# Patient Record
Sex: Female | Born: 1990
Health system: Southern US, Community
[De-identification: ages and names within clinical notes are randomized; demographics above are authoritative.]

## PROBLEM LIST (undated history)

## (undated) DIAGNOSIS — F419 Anxiety disorder, unspecified: Secondary | ICD-10-CM

## (undated) DIAGNOSIS — IMO0002 Reserved for concepts with insufficient information to code with codable children: Secondary | ICD-10-CM

## (undated) DIAGNOSIS — M3500 Sicca syndrome, unspecified: Secondary | ICD-10-CM

## (undated) DIAGNOSIS — F32A Depression, unspecified: Secondary | ICD-10-CM

## (undated) DIAGNOSIS — M329 Systemic lupus erythematosus, unspecified: Secondary | ICD-10-CM

## (undated) HISTORY — DX: Systemic lupus erythematosus, unspecified: M32.9

## (undated) HISTORY — DX: Anxiety disorder, unspecified: F41.9

## (undated) HISTORY — DX: Depression, unspecified: F32.A

## (undated) HISTORY — DX: Reserved for concepts with insufficient information to code with codable children: IMO0002

## (undated) HISTORY — PX: CYSTOSCOPY: SUR368

## (undated) HISTORY — DX: Sjogren syndrome, unspecified: M35.00

## (undated) HISTORY — PX: ABCESS DRAINAGE: SHX399

---

## 2017-06-12 DIAGNOSIS — L930 Discoid lupus erythematosus: Secondary | ICD-10-CM | POA: Insufficient documentation

## 2017-06-12 DIAGNOSIS — M3500 Sicca syndrome, unspecified: Secondary | ICD-10-CM | POA: Insufficient documentation

## 2018-09-26 ENCOUNTER — Other Ambulatory Visit: Payer: Self-pay

## 2018-09-26 DIAGNOSIS — Z20822 Contact with and (suspected) exposure to covid-19: Secondary | ICD-10-CM

## 2018-09-27 LAB — NOVEL CORONAVIRUS, NAA: SARS-CoV-2, NAA: NOT DETECTED

## 2019-08-22 DIAGNOSIS — F331 Major depressive disorder, recurrent, moderate: Secondary | ICD-10-CM | POA: Diagnosis not present

## 2019-08-22 DIAGNOSIS — G4700 Insomnia, unspecified: Secondary | ICD-10-CM | POA: Diagnosis not present

## 2019-10-23 DIAGNOSIS — F331 Major depressive disorder, recurrent, moderate: Secondary | ICD-10-CM | POA: Diagnosis not present

## 2019-10-23 DIAGNOSIS — G4700 Insomnia, unspecified: Secondary | ICD-10-CM | POA: Diagnosis not present

## 2019-10-23 DIAGNOSIS — F411 Generalized anxiety disorder: Secondary | ICD-10-CM | POA: Diagnosis not present

## 2019-11-13 DIAGNOSIS — N914 Secondary oligomenorrhea: Secondary | ICD-10-CM | POA: Diagnosis not present

## 2019-11-13 DIAGNOSIS — R300 Dysuria: Secondary | ICD-10-CM | POA: Diagnosis not present

## 2019-11-13 DIAGNOSIS — N61 Mastitis without abscess: Secondary | ICD-10-CM | POA: Diagnosis not present

## 2019-11-13 DIAGNOSIS — Z6822 Body mass index (BMI) 22.0-22.9, adult: Secondary | ICD-10-CM | POA: Diagnosis not present

## 2019-11-19 MED FILL — CITALOPRAM HBR 20 MG TABLET: 20 | 30 days supply | Qty: 30 | Fill #0

## 2019-11-19 MED FILL — traZODone HCL 50 MG TABS: 50 | 30 days supply | Qty: 30 | Fill #0

## 2019-11-19 MED FILL — HYDROXYZINE HCL 25 MG TABS: 25 | 30 days supply | Qty: 60 | Fill #0

## 2019-11-19 MED FILL — SPIRONOLACTONE 25 MG TABS: 25 | 30 days supply | Qty: 60 | Fill #0

## 2019-11-28 ENCOUNTER — Ambulatory Visit: Payer: Self-pay | Attending: Internal Medicine

## 2019-11-28 DIAGNOSIS — Z23 Encounter for immunization: Secondary | ICD-10-CM

## 2019-11-28 NOTE — Progress Notes (Signed)
   Cov[facility] Vaccination [facility]  Name:  Debbie [name]    MRN: [id number] DOB: 11-13-1990  11/28/2019  [name] was observed post Covid-19 immunization for 15 minutes without incident. She was provided with Vaccine Information Sheet and instruction to access the V-Safe system.   [name] was instructed to call 911 with any severe reactions post vaccine: Marland Kitchen Difficulty breathing  . Swelling of face and throat  . A fast heartbeat  . A bad rash all over body  . Dizziness and weakness

## 2019-12-25 ENCOUNTER — Other Ambulatory Visit (HOSPITAL_COMMUNITY): Payer: Self-pay | Admitting: Internal Medicine

## 2019-12-25 DIAGNOSIS — F411 Generalized anxiety disorder: Secondary | ICD-10-CM | POA: Diagnosis not present

## 2019-12-25 DIAGNOSIS — G4700 Insomnia, unspecified: Secondary | ICD-10-CM | POA: Diagnosis not present

## 2019-12-25 DIAGNOSIS — F331 Major depressive disorder, recurrent, moderate: Secondary | ICD-10-CM | POA: Diagnosis not present

## 2019-12-25 MED FILL — HYDROXYZINE HCL 25 MG TABS: 25 | 30 days supply | Qty: 60 | Fill #0

## 2019-12-25 MED FILL — traZODone HCL 50 MG TABS: 50 | 30 days supply | Qty: 30 | Fill #0

## 2019-12-25 MED FILL — CITALOPRAM HBR 20 MG TABLET: 20 | 30 days supply | Qty: 30 | Fill #0

## 2020-01-01 DIAGNOSIS — Z20822 Contact with and (suspected) exposure to covid-19: Secondary | ICD-10-CM | POA: Diagnosis not present

## 2020-01-22 MED FILL — traZODone HCL 50 MG TABS: 50 | 30 days supply | Qty: 30 | Fill #1

## 2020-01-22 MED FILL — HYDROXYZINE HCL 25 MG TABS: 25 | 30 days supply | Qty: 60 | Fill #1

## 2020-01-22 MED FILL — CITALOPRAM HBR 20 MG TABLET: 20 | 30 days supply | Qty: 30 | Fill #1

## 2020-01-27 ENCOUNTER — Other Ambulatory Visit (HOSPITAL_COMMUNITY): Payer: Self-pay | Admitting: Internal Medicine

## 2020-01-27 DIAGNOSIS — R5383 Other fatigue: Secondary | ICD-10-CM | POA: Diagnosis not present

## 2020-01-27 DIAGNOSIS — F331 Major depressive disorder, recurrent, moderate: Secondary | ICD-10-CM | POA: Diagnosis not present

## 2020-01-27 DIAGNOSIS — E559 Vitamin D deficiency, unspecified: Secondary | ICD-10-CM | POA: Diagnosis not present

## 2020-01-27 DIAGNOSIS — G4700 Insomnia, unspecified: Secondary | ICD-10-CM | POA: Diagnosis not present

## 2020-01-27 DIAGNOSIS — F411 Generalized anxiety disorder: Secondary | ICD-10-CM | POA: Diagnosis not present

## 2020-01-27 MED FILL — FLUoxetine HCL 20 MG CAPS: 20 | 30 days supply | Qty: 30 | Fill #0

## 2020-03-04 ENCOUNTER — Other Ambulatory Visit (HOSPITAL_COMMUNITY): Payer: Self-pay | Admitting: Internal Medicine

## 2020-03-04 DIAGNOSIS — F411 Generalized anxiety disorder: Secondary | ICD-10-CM | POA: Diagnosis not present

## 2020-03-04 DIAGNOSIS — Z9189 Other specified personal risk factors, not elsewhere classified: Secondary | ICD-10-CM | POA: Diagnosis not present

## 2020-03-04 DIAGNOSIS — F331 Major depressive disorder, recurrent, moderate: Secondary | ICD-10-CM | POA: Diagnosis not present

## 2020-03-04 DIAGNOSIS — G4700 Insomnia, unspecified: Secondary | ICD-10-CM | POA: Diagnosis not present

## 2020-03-04 MED FILL — HYDROXYZINE HCL 25 MG TABS: 25 | 30 days supply | Qty: 60 | Fill #0

## 2020-03-04 MED FILL — traZODone HCL 50 MG TABS: 50 | 30 days supply | Qty: 30 | Fill #0

## 2020-03-04 MED FILL — FLUoxetine HCL 20 MG CAPS: 20 | 30 days supply | Qty: 30 | Fill #0

## 2020-03-18 MED FILL — traZODone HCL 50 MG TABS: 50 | 30 days supply | Qty: 30 | Fill #0

## 2020-04-01 MED FILL — FLUoxetine HCL 20 MG CAPS: 20 | 30 days supply | Qty: 30 | Fill #0

## 2020-04-02 ENCOUNTER — Other Ambulatory Visit (HOSPITAL_COMMUNITY): Payer: Self-pay | Admitting: Internal Medicine

## 2020-04-02 DIAGNOSIS — F331 Major depressive disorder, recurrent, moderate: Secondary | ICD-10-CM | POA: Diagnosis not present

## 2020-04-02 DIAGNOSIS — G4700 Insomnia, unspecified: Secondary | ICD-10-CM | POA: Diagnosis not present

## 2020-04-02 DIAGNOSIS — F411 Generalized anxiety disorder: Secondary | ICD-10-CM | POA: Diagnosis not present

## 2020-04-23 MED FILL — traZODone HCL 50 MG TABS: 50 | 30 days supply | Qty: 30 | Fill #0

## 2020-05-05 ENCOUNTER — Other Ambulatory Visit: Payer: Self-pay

## 2020-05-05 ENCOUNTER — Other Ambulatory Visit (HOSPITAL_COMMUNITY)
Admission: RE | Admit: 2020-05-05 | Discharge: 2020-05-05 | Disposition: A | Discharge status: Home / Self Care | Payer: 59 | Source: Ambulatory Visit | Attending: Physician Assistant | Admitting: Physician Assistant

## 2020-05-05 ENCOUNTER — Encounter: Payer: Self-pay | Admitting: Physician Assistant

## 2020-05-05 ENCOUNTER — Ambulatory Visit (INDEPENDENT_AMBULATORY_CARE_PROVIDER_SITE_OTHER): Payer: 59 | Admitting: Physician Assistant

## 2020-05-05 VITALS — BP 111/78 | HR 67 | Temp 98.2°F | Ht 66.0 in | Wt 141.0 lb

## 2020-05-05 DIAGNOSIS — Z124 Encounter for screening for malignant neoplasm of cervix: Secondary | ICD-10-CM | POA: Diagnosis not present

## 2020-05-05 DIAGNOSIS — Z0000 Encounter for general adult medical examination without abnormal findings: Secondary | ICD-10-CM | POA: Insufficient documentation

## 2020-05-05 DIAGNOSIS — R300 Dysuria: Secondary | ICD-10-CM | POA: Diagnosis not present

## 2020-05-05 DIAGNOSIS — F320 Major depressive disorder, single episode, mild: Secondary | ICD-10-CM | POA: Diagnosis not present

## 2020-05-05 DIAGNOSIS — M3505 Sjogren syndrome with inflammatory arthritis: Secondary | ICD-10-CM

## 2020-05-05 LAB — POCT URINALYSIS DIPSTICK
Bilirubin, UA: NEGATIVE
Blood, UA: NEGATIVE
Glucose, UA: NEGATIVE
Ketones, UA: NEGATIVE
Leukocytes, UA: NEGATIVE
Nitrite, UA: NEGATIVE
Protein, UA: NEGATIVE
Spec Grav, UA: 1.015 (ref 1.010–1.025)
Urobilinogen, UA: 0.2 E.U./dL
pH, UA: 7 (ref 5.0–8.0)

## 2020-05-05 NOTE — Progress Notes (Signed)
New Patient Office Visit  Subjective:  Patient ID: Debbie [name], female    DOB: 1991-01-02  Age: 30 y.o. MRN: [id number]  CC:  Chief Complaint  Patient presents with  . Annual Exam    HPI DERRY ARBOGAST presents for new patient establishment and annual exam.  She has been married for 2 years to her husband and has no children.  She is working as a Pharmacist, community.  She enjoys her work very much.  She has a Liletta IUD placed in 2018 or 2019 (card at home with exact date).   Acute concerns: She has a hx of Sjogren's and possibly Lupus. She has joint pain mostly in hands, knees, and feet, and stiffness. Starting to have more rashes and skin concerns. Previously saw rheumatology about 3 years ago and needs new referral.   Health maintenance: Lifestyle/ exercise: Minimal  Nutrition: Home cooked meals, rarely fast food  Mental health: Sees psychiatry, hx depression, no hospitalizations, no SI / HI  Caffeine: 20 ounces coffee daily, no other drinks  Sleep: At least 5 hours per night, takes Trazodone 50 mg every night Substance use: Social drinks or cigarettes rarely  Sexual activity: Married, monogamous, no hx STIs Immunizations: UTD Pap: At least 2 years ago, always normal.  Past Medical History:  Diagnosis Date  . Anxiety   . Depression   . Lupus (HCC)     History reviewed. No pertinent surgical history.  Family History  Problem Relation Age of Onset  . Thyroid disease Mother   . Cancer Maternal Grandmother   . Cancer Paternal Grandfather     Social History   Socioeconomic History  . Marital status: Married    Spouse name: Not on file  . Number of children: Not on file  . Years of education: Not on file  . Highest education level: Not on file  Occupational History  . Not on file  Tobacco Use  . Smoking status: Light Tobacco Smoker  . Smokeless tobacco: Never Used  Vaping Use  . Vaping Use: Never used  Substance and Sexual  Activity  . Alcohol use: Yes    Comment: rare  . Drug use: Not on file  . Sexual activity: Not on file  Other Topics Concern  . Not on file  Social History Narrative  . Not on file   Social Determinants of Health   Financial Resource Strain: Not on file  Food Insecurity: Not on file  Transportation Needs: Not on file  Physical Activity: Not on file  Stress: Not on file  Social Connections: Not on file  Intimate Partner Violence: Not on file    ROS Review of Systems  Constitutional: Negative for activity change, appetite change, fever and unexpected weight change.  HENT: Negative for congestion.   Eyes: Negative for visual disturbance.  Respiratory: Negative for apnea, cough and shortness of breath.   Cardiovascular: Negative for chest pain, palpitations and leg swelling.  Gastrointestinal: Negative for abdominal pain, blood in stool, constipation and diarrhea.  Endocrine: Negative for polydipsia, polyphagia and polyuria.  Genitourinary: Negative for dysuria and pelvic pain.  Musculoskeletal: Positive for arthralgias.  Skin: Negative for rash.  Neurological: Negative for dizziness, weakness and headaches.  Hematological: Negative for adenopathy. Does not bruise/bleed easily.  Psychiatric/Behavioral: Positive for sleep disturbance. Negative for suicidal ideas. The patient is not nervous/anxious.     Objective:   Today's Vitals: BP 111/78   Pulse 67   Temp 98.2 F (36.8  C)   Ht 5\' 6"  (1.676 m)   Wt 141 lb (64 kg)   SpO2 98%   BMI 22.76 kg/m   Physical Exam Vitals and nursing note reviewed. Exam conducted with a chaperone present.  Constitutional:      Appearance: Normal appearance. She is normal weight. She is not toxic-appearing.  HENT:     Head: Normocephalic and atraumatic.     Right Ear: Tympanic membrane, ear canal and external ear normal.     Left Ear: Tympanic membrane, ear canal and external ear normal.     Nose: Nose normal.     Mouth/Throat:      Mouth: Mucous membranes are moist.  Eyes:     Extraocular Movements: Extraocular movements intact.     Conjunctiva/sclera: Conjunctivae normal.     Pupils: Pupils are equal, round, and reactive to light.  Cardiovascular:     Rate and Rhythm: Normal rate and regular rhythm.     Pulses: Normal pulses.     Heart sounds: Normal heart sounds.  Pulmonary:     Effort: Pulmonary effort is normal.     Breath sounds: Normal breath sounds.  Abdominal:     General: Abdomen is flat. Bowel sounds are normal.     Palpations: Abdomen is soft.  Genitourinary:    General: Normal vulva.     Labia:        Right: No rash or tenderness.        Left: No rash or tenderness.      Vagina: Normal.     Cervix: Normal.     Uterus: Normal.      Adnexa: Right adnexa normal and left adnexa normal.  Musculoskeletal:        General: Normal range of motion.     Cervical back: Normal range of motion and neck supple.  Skin:    General: Skin is warm and dry.  Neurological:     General: No focal deficit present.     Mental Status: She is alert and oriented to person, place, and time.  Psychiatric:        Mood and Affect: Mood normal.        Behavior: Behavior normal.        Thought Content: Thought content normal.        Judgment: Judgment normal.     Assessment & Plan:   Problem List Items Addressed This Visit   None   Visit Diagnoses    Encounter for annual physical exam    -  Primary   Relevant Orders   Cytology - PAP   POCT urinalysis dipstick (Completed)   Urine Culture   Encounter for Papanicolaou smear for cervical cancer screening       Relevant Orders   Cytology - PAP   POCT urinalysis dipstick (Completed)   Urine Culture   Sjogren's syndrome with inflammatory arthritis       Relevant Orders   Ambulatory referral to Rheumatology   Depression, major, single episode, mild (HCC)       Relevant Medications   FLUoxetine (PROZAC) 20 MG capsule   traZODone (DESYREL) 50 MG tablet    hydrOXYzine (ATARAX/VISTARIL) 25 MG tablet   Dysuria       Relevant Orders   POCT urinalysis dipstick (Completed)   Urine Culture      Outpatient Encounter Medications as of 05/05/2020  Medication Sig  . cholecalciferol (VITAMIN D3) 25 MCG (1000 UNIT) tablet Take 1,000 Units by mouth daily.  05/07/2020  FLUoxetine (PROZAC) 20 MG capsule Take 20 mg by mouth daily.  . hydrOXYzine (ATARAX/VISTARIL) 25 MG tablet   . Multiple Vitamin (MULTIVITAMIN) tablet Take 1 tablet by mouth daily.  . traZODone (DESYREL) 50 MG tablet Take 50 mg by mouth at bedtime.   No facility-administered encounter medications on file as of 05/05/2020.    Follow-up: Return in about 1 year (around 05/05/2021) for CPE .   1. Encounter for annual physical exam 2. Encounter for Papanicolaou smear for cervical cancer screening Age-appropriate screening and counseling performed today.  Encouraged her to increase physical activity.  She also needs to cut back on her caffeine and see if this helps her sleep patterns.  Encouraged flu shot every year.  Will call with Pap results.  3. Sjogren's syndrome with inflammatory arthritis Per so stated from patient.  She would like to see rheumatology again and referral was placed today.  4. Depression, major, single episode, mild (HCC) She is stable on her current medications and will follow up with psychiatry as she is scheduled.  5. Dysuria Urine sent for culture today.  Will call with antibiotic if needed.  Again decreasing caffeine use may help.  This note was prepared with assistance of Conservation officer, historic buildings. Occasional wrong-word or sound-a-like substitutions may have occurred due to the inherent limitations of voice recognition software.  This visit occurred during the SARS-CoV-2 public health emergency.  Safety protocols were in place, including screening questions prior to the visit, additional usage of staff PPE, and extensive cleaning of exam room while observing  appropriate contact time as indicated for disinfecting solutions.    Makalyn Lennox M Ladainian Therien, PA-C

## 2020-05-05 NOTE — Patient Instructions (Addendum)
Good to meet you today! Please go to the lab for a U/A and urine culture and I will send results through [facility].  You will also be contacted when your pap results are back. Someone will call to schedule with rheumatology.  See you back in one year. Call sooner if any concerns.    Preventive Care 101-28 Years Old, Female Preventive care refers to lifestyle choices and visits with your health care provider that can promote health and wellness. This includes:  A yearly physical exam. This is also called an annual wellness visit.  Regular dental and eye exams.  Immunizations.  Screening for certain conditions.  Healthy lifestyle choices, such as: ? Eating a healthy diet. ? Getting regular exercise. ? Not using drugs or products that contain nicotine and tobacco. ? Limiting alcohol use. What can I expect for my preventive care visit? Physical exam Your health care provider may check your:  Height and weight. These may be used to calculate your BMI (body mass index). BMI is a measurement that tells if you are at a healthy weight.  Heart rate and blood pressure.  Body temperature.  Skin for abnormal spots. Counseling Your health care provider may ask you questions about your:  Past medical problems.  Family's medical history.  Alcohol, tobacco, and drug use.  Emotional well-being.  Home life and relationship well-being.  Sexual activity.  Diet, exercise, and sleep habits.  Work and work Statistician.  Access to firearms.  Method of birth control.  Menstrual cycle.  Pregnancy history. What immunizations do I need? Vaccines are usually given at various ages, according to a schedule. Your health care provider will recommend vaccines for you based on your age, medical history, and lifestyle or other factors, such as travel or where you work.   What tests do I need? Blood tests  Lipid and cholesterol levels. These may be checked every 5 years starting at age  63.  Hepatitis C test.  Hepatitis B test. Screening  Diabetes screening. This is done by checking your blood sugar (glucose) after you have not eaten for a while (fasting).  STD (sexually transmitted disease) testing, if you are at risk.  BRCA-related cancer screening. This may be done if you have a family history of breast, ovarian, tubal, or peritoneal cancers.  Pelvic exam and Pap test. This may be done every 3 years starting at age 46. Starting at age 27, this may be done every 5 years if you have a Pap test in combination with an HPV test. Talk with your health care provider about your test results, treatment options, and if necessary, the need for more tests.   Follow these instructions at home: Eating and drinking  Eat a healthy diet that includes fresh fruits and vegetables, whole grains, lean protein, and low-fat dairy products.  Take vitamin and mineral supplements as recommended by your health care provider.  Do not drink alcohol if: ? Your health care provider tells you not to drink. ? You are pregnant, may be pregnant, or are planning to become pregnant.  If you drink alcohol: ? Limit how much you have to 0-1 drink a day. ? Be aware of how much alcohol is in your drink. In the [place] one drink equals one 12 oz bottle of beer (355 mL), one 5 oz glass of wine (148 mL), or one 1 oz glass of hard liquor (44 mL).   Lifestyle  Take daily care of your teeth and gums. Brush your teeth every  morning and night with fluoride toothpaste. Floss one time each day.  Stay active. Exercise for at least 30 minutes 5 or more days each week.  Do not use any products that contain nicotine or tobacco, such as cigarettes, e-cigarettes, and chewing tobacco. If you need help quitting, ask your health care provider.  Do not use drugs.  If you are sexually active, practice safe sex. Use a condom or other form of protection to prevent STIs (sexually transmitted infections).  If you do not  wish to become pregnant, use a form of birth control. If you plan to become pregnant, see your health care provider for a prepregnancy visit.  Find healthy ways to cope with stress, such as: ? Meditation, yoga, or listening to music. ? Journaling. ? Talking to a trusted person. ? Spending time with friends and family. Safety  Always wear your seat belt while driving or riding in a vehicle.  Do not drive: ? If you have been drinking alcohol. Do not ride with someone who has been drinking. ? When you are tired or distracted. ? While texting.  Wear a helmet and other protective equipment during sports activities.  If you have firearms in your house, make sure you follow all gun safety procedures.  Seek help if you have been physically or sexually abused. What's next?  Go to your health care provider once a year for an annual wellness visit.  Ask your health care provider how often you should have your eyes and teeth checked.  Stay up to date on all vaccines. This information is not intended to replace advice given to you by your health care provider. Make sure you discuss any questions you have with your health care provider. Document Revised: 10/06/2019 Document Reviewed: 10/19/2017 Elsevier Patient Education  2021 [employer].

## 2020-05-06 LAB — URINE CULTURE
MICRO NUMBER:: 11648851
Result:: NO GROWTH
SPECIMEN QUALITY:: ADEQUATE

## 2020-05-07 LAB — CYTOLOGY - PAP
Adequacy: ABSENT
Chlamydia: NEGATIVE
Comment: NEGATIVE
Comment: NORMAL
Diagnosis: NEGATIVE
Neisseria Gonorrhea: NEGATIVE

## 2020-05-08 ENCOUNTER — Ambulatory Visit: Payer: 59 | Admitting: Physician Assistant

## 2020-05-08 ENCOUNTER — Encounter: Payer: Self-pay | Admitting: Physician Assistant

## 2020-05-08 ENCOUNTER — Other Ambulatory Visit: Payer: Self-pay

## 2020-05-08 VITALS — BP 104/70 | HR 69 | Temp 98.1°F | Ht 66.0 in | Wt 144.0 lb

## 2020-05-08 DIAGNOSIS — B079 Viral wart, unspecified: Secondary | ICD-10-CM

## 2020-05-08 NOTE — Patient Instructions (Signed)
Excision of Skin Lesions, Care After  This sheet gives you information about how to care for yourself after your procedure. Your health care provider may also give you more specific instructions. If you have problems or questions, contact your health care provider.  What can I expect after the procedure?  After your procedure, it is common to have pain or discomfort at the excision site.  Follow these instructions at home:  Excision care       · Follow instructions from your health care provider about how to take care of your excision site. Make sure you:  ? Wash your hands with soap and water before and after you change your bandage (dressing). If soap and water are not available, use hand sanitizer.  ? Change your dressing as told by your health care provider.  ? Leave stitches (sutures), skin glue, or adhesive strips in place. These skin closures may need to stay in place for 2 weeks or longer. If adhesive strip edges start to loosen and curl up, you may trim the loose edges. Do not remove adhesive strips completely unless your health care provider tells you to do that.  · Check the excision area every day for signs of infection. Watch for:  ? Redness, swelling, or pain.  ? Fluid or blood.  ? Warmth.  ? Pus or a bad smell.  · Keep the site clean, dry, and protected for at least 48 hours.  · For bleeding, apply gentle but firm pressure to the area using a folded towel for 20 minutes.  · Avoid high-impact exercise and activities until the sutures are removed or the area heals.  General instructions  · Take over-the-counter and prescription medicines only as told by your health care provider.  · Follow instructions from your health care provider about how to minimize scarring. Scarring should lessen over time.  · Avoid sun exposure until the area has healed. Use sunscreen to protect the area from the sun after it has healed.  · Keep all follow-up visits as told by your health care provider. This is  important.  Contact a health care provider if:  · You have redness, swelling, or pain around your excision site.  · You have fluid or blood coming from your excision site.  · Your excision site feels warm to the touch.  · You have pus or a bad smell coming from your excision site.  · You have a fever.  · You have pain that does not improve in 2-3 days after your procedure.  · You notice skin irregularities or changes in how you feel (sensation).  Summary  · This sheet of instructions provides you with information about caring for yourself after your procedure. Contact your health care provider if you have any problems or questions.  · Take over-the-counter and prescription medicines only as told by your health care provider.  · Change your dressing as told by your health care provider.  · Contact a health care provider if you have redness, swelling, pain, or other signs of infection around your excision site.  · Keep all follow-up visits as told by your health care provider. This is important.  This information is not intended to replace advice given to you by your health care provider. Make sure you discuss any questions you have with your health care provider.  Document Revised: 08/16/2017 Document Reviewed: 08/16/2017  Elsevier Patient Education © 2021 Elsevier Inc.   

## 2020-05-08 NOTE — Progress Notes (Signed)
Acute Office Visit  Subjective:    Patient ID: Debbie [name], female    DOB: [birth year], 30 y.o.   MRN: [id number]  Chief Complaint  Patient presents with  . Warts    HPI Patient is in today for wart removal left thumb. States it has been there for several years. Prior attempt at destruction did not work. She would like to try the liquid nitrogen again. She denies any pain or other issues.   Past Medical History:  Diagnosis Date  . Anxiety   . Depression   . Lupus (HCC)     History reviewed. No pertinent surgical history.  Family History  Problem Relation Age of Onset  . Thyroid disease Mother   . Cancer Maternal Grandmother   . Cancer Paternal Grandfather     Social History   Socioeconomic History  . Marital status: Married    Spouse name: Not on file  . Number of children: Not on file  . Years of education: Not on file  . Highest education level: Not on file  Occupational History  . Not on file  Tobacco Use  . Smoking status: Light Tobacco Smoker  . Smokeless tobacco: Never Used  Vaping Use  . Vaping Use: Never used  Substance and Sexual Activity  . Alcohol use: Yes    Comment: rare  . Drug use: Not on file  . Sexual activity: Not on file  Other Topics Concern  . Not on file  Social History Narrative  . Not on file   Social Determinants of Health   Financial Resource Strain: Not on file  Food Insecurity: Not on file  Transportation Needs: Not on file  Physical Activity: Not on file  Stress: Not on file  Social Connections: Not on file  Intimate Partner Violence: Not on file    Outpatient Medications Prior to Visit  Medication Sig Dispense Refill  . cholecalciferol (VITAMIN D3) 25 MCG (1000 UNIT) tablet Take 1,000 Units by mouth daily.    Marland Kitchen FLUoxetine (PROZAC) 20 MG capsule Take 20 mg by mouth daily.    . hydrOXYzine (ATARAX/VISTARIL) 25 MG tablet     . Multiple Vitamin (MULTIVITAMIN) tablet Take 1 tablet by mouth daily.    . traZODone  (DESYREL) 50 MG tablet Take 50 mg by mouth at bedtime.     No facility-administered medications prior to visit.    Allergies  Allergen Reactions  . Sulfamethoxazole-Trimethoprim Rash and Swelling    Review of Systems +Wart left thumb, otherwise negative     Objective:    Physical Exam Constitutional:      General: She is not in acute distress.    Appearance: Normal appearance. She is not ill-appearing.  Skin:    Comments: LARGE WART PRESENT LEFT THUMB  Neurological:     Mental Status: She is alert.     BP 104/70   Pulse 69   Temp 98.1 F (36.7 C)   Ht 5\' 6"  (1.676 m)   Wt 144 lb 0.3 oz (65.3 kg)   SpO2 100%   BMI 23.25 kg/m  Wt Readings from Last 3 Encounters:  05/08/20 144 lb 0.3 oz (65.3 kg)  05/05/20 141 lb (64 kg)       Assessment & Plan:   Problem List Items Addressed This Visit   None     1. Viral wart on left thumb Procedure explained and verbal consent obtained. Area prepped in usual manner. #11 blade used to debride the area. Liquid nitrogen  used to destruct lesion x 3, good ice ring formation and resolution between sprays. Pt tolerated well. Aftercare instructions provided. Recheck in 2 weeks.  This visit occurred during the SARS-CoV-2 public health emergency.  Safety protocols were in place, including screening questions prior to the visit, additional usage of staff PPE, and extensive cleaning of exam room while observing appropriate contact time as indicated for disinfecting solutions.    Daily Doe M London Tarnowski, PA-C

## 2020-05-14 MED FILL — traZODone HCL 50 MG TABS: 50 | 30 days supply | Qty: 30 | Fill #0

## 2020-05-14 MED FILL — FLUoxetine HCL 20 MG CAPS: 20 | 90 days supply | Qty: 90 | Fill #0

## 2020-05-22 ENCOUNTER — Other Ambulatory Visit: Payer: Self-pay

## 2020-05-22 ENCOUNTER — Ambulatory Visit: Payer: 59 | Admitting: Physician Assistant

## 2020-05-22 ENCOUNTER — Other Ambulatory Visit (HOSPITAL_COMMUNITY): Payer: Self-pay | Admitting: Physician Assistant

## 2020-05-22 VITALS — BP 118/70 | HR 78 | Temp 97.5°F | Resp 16 | Ht 66.0 in | Wt 148.8 lb

## 2020-05-22 DIAGNOSIS — B079 Viral wart, unspecified: Secondary | ICD-10-CM | POA: Diagnosis not present

## 2020-05-22 DIAGNOSIS — K582 Mixed irritable bowel syndrome: Secondary | ICD-10-CM | POA: Diagnosis not present

## 2020-05-22 DIAGNOSIS — L700 Acne vulgaris: Secondary | ICD-10-CM

## 2020-05-22 MED ORDER — SPIRONOLACTONE 25 MG PO TABS: 25.0000 mg | ORAL_TABLET | Freq: Two times a day (BID) | ORAL | 2 refills | Status: DC

## 2020-05-22 NOTE — Progress Notes (Signed)
Acute Office Visit  Subjective:    Patient ID: Debbie [name], female    DOB: October 13, 1990, 30 y.o.   MRN: [id number]  Chief Complaint  Patient presents with  . Warts    Pt has wart on thumb doing better, but still there  . Abdominal Pain    Pt reports burning pain in her stomach at nights, antiacid did not help, bloating, noticed tomatos and dairy have caused the most issue has cut these out but still struggling with this, no family hxt, concerned may have IBS as switches between being constipated and having loose stools     HPI Patient is in today for viral wart recheck on her left thumb.  We used liquid nitrogen on 05/08/2020 and patient says that most of it seems to be gone and she did well after the treatment, but she thinks it needs to be sprayed at least 1 more time today.  She also wants to discuss some abdominal issues she has been having.  She is wondering if she is having IBS.  For the last few months she is either constipated or having loose stools.  She denies any blood in her stool.  She has not had any weight changes, fever, chills, night sweats.  She has been feeling bloated and gassy.  She noticed that red sauces and dairy seem to cause most of the issues.  She tried an antacid thinking it was her GERD and this did not help a whole lot.  Patient is also concerned about acne on her face.  She says that she saw a dermatologist last year and was prescribed spironolactone and did very well with this medication.  She is hoping I can prescribe this again for her to try to get her acne cleared up.  Past Medical History:  Diagnosis Date  . Anxiety   . Depression   . Lupus (HCC)     No past surgical history on file.  Family History  Problem Relation Age of Onset  . Thyroid disease Mother   . Cancer Maternal Grandmother   . Cancer Paternal Grandfather     Social History   Socioeconomic History  . Marital status: Married    Spouse name: Not on file  . Number of  children: Not on file  . Years of education: Not on file  . Highest education level: Not on file  Occupational History  . Not on file  Tobacco Use  . Smoking status: Light Tobacco Smoker  . Smokeless tobacco: Never Used  Vaping Use  . Vaping Use: Never used  Substance and Sexual Activity  . Alcohol use: Yes    Comment: rare  . Drug use: Not on file  . Sexual activity: Not on file  Other Topics Concern  . Not on file  Social History Narrative  . Not on file   Social Determinants of Health   Financial Resource Strain: Not on file  Food Insecurity: Not on file  Transportation Needs: Not on file  Physical Activity: Not on file  Stress: Not on file  Social Connections: Not on file  Intimate Partner Violence: Not on file    Outpatient Medications Prior to Visit  Medication Sig Dispense Refill  . cholecalciferol (VITAMIN D3) 25 MCG (1000 UNIT) tablet Take 1,000 Units by mouth daily.    Marland Kitchen FLUoxetine (PROZAC) 20 MG capsule Take 20 mg by mouth daily.    . hydrOXYzine (ATARAX/VISTARIL) 25 MG tablet     . Multiple Vitamin (  MULTIVITAMIN) tablet Take 1 tablet by mouth daily.    . traZODone (DESYREL) 50 MG tablet Take 50 mg by mouth at bedtime.     No facility-administered medications prior to visit.    Allergies  Allergen Reactions  . Sulfamethoxazole-Trimethoprim Rash and Swelling    Review of Systems REFER TO HPI FOR PERTINENT POSITIVES AND NEGATIVES     Objective:    Physical Exam Vitals and nursing note reviewed.  Constitutional:      Appearance: She is well-developed.  Cardiovascular:     Heart sounds: Normal heart sounds.  Pulmonary:     Effort: Pulmonary effort is normal.     Breath sounds: Normal breath sounds.  Abdominal:     General: Abdomen is flat. Bowel sounds are normal. There is no distension.     Palpations: Abdomen is soft.     Tenderness: There is no abdominal tenderness.  Skin:    Comments: Some pustules c/w acne on face in T-zone, especially  around the chin area.   Viral wart still present on left thumb, but improved / smaller in size.  Neurological:     Mental Status: She is alert.     BP 118/70   Pulse 78   Temp (!) 97.5 F (36.4 C) (Temporal)   Resp 16   Ht 5\' 6"  (1.676 m)   Wt 148 lb 12.8 oz (67.5 kg)   SpO2 97%   BMI 24.02 kg/m  Wt Readings from Last 3 Encounters:  05/22/20 148 lb 12.8 oz (67.5 kg)  05/08/20 144 lb 0.3 oz (65.3 kg)  05/05/20 141 lb (64 kg)    Health Maintenance Due  Topic Date Due  . Hepatitis C Screening  Never done  . HIV Screening  Never done  . COVID-19 Vaccine (2 - Pfizer 3-dose series) 12/19/2019     Assessment & Plan:   Problem List Items Addressed This Visit   None   Visit Diagnoses    Viral wart on left thumb    -  Primary   Irritable bowel syndrome with both constipation and diarrhea       Acne vulgaris           Meds ordered this encounter  Medications  . spironolactone (ALDACTONE) 25 MG tablet    Sig: Take 1 tablet (25 mg total) by mouth 2 (two) times daily.    Dispense:  60 tablet    Refill:  2   1. Viral wart on left thumb Procedure explained and verbal consent obtained taken in office today.  Area prepped in the usual manner.  Liquid nitrogen used to distract the lesion x3, good ice ring formation and resolution between sprays.  Patient tolerated this well.  Aftercare instructions provided.  She will call back in 2 weeks if viral wart is still present and it needs to be treated again.  2. Irritable bowel syndrome with both constipation and diarrhea I do think she is having some IBS symptoms.  We discussed several options today.  I strongly encouraged her to start keeping a food diary and to start on elimination diets.  For example she will go 2 weeks without dairy and then 2 weeks without gluten, etc. hopefully we can find some food triggers.  She is advised to avoid high gassy foods.  If symptoms persist or she cannot find resolution, she will recheck in office  and we can discuss further work-up and treatment from there.  3. Acne vulgaris I agreed to start her back  on spironolactone.  Side effects discussed.  She will start with 25 mg daily and then she can increase to twice daily.  Patient will call back if worsening or no improvement of her acne symptoms.  This note was prepared with assistance of Conservation officer, historic buildings. Occasional wrong-word or sound-a-like substitutions may have occurred due to the inherent limitations of voice recognition software.    Graysin Luczynski M Deysha Cartier, PA-C

## 2020-05-22 NOTE — Patient Instructions (Signed)
Please call back in 2 weeks with update on your wart.  Try the elimination diets as we discussed and keep a food diary.  Let me know how this is going and if we need to recheck at all.  I sent the spironolactone to your pharmacy.  Start with 1 tablet daily and then after 1 week increase to twice daily if you are tolerating this well.

## 2020-05-23 ENCOUNTER — Other Ambulatory Visit (HOSPITAL_COMMUNITY): Payer: Self-pay

## 2020-05-23 MED FILL — Spironolactone Tab 25 MG: ORAL | 30 days supply | Qty: 60 | Fill #0 | Status: AC

## 2020-05-24 ENCOUNTER — Other Ambulatory Visit (HOSPITAL_COMMUNITY): Payer: Self-pay

## 2020-05-25 ENCOUNTER — Other Ambulatory Visit (HOSPITAL_COMMUNITY): Payer: Self-pay

## 2020-06-10 ENCOUNTER — Encounter: Payer: Self-pay | Admitting: Internal Medicine

## 2020-06-10 ENCOUNTER — Other Ambulatory Visit: Payer: Self-pay

## 2020-06-10 ENCOUNTER — Other Ambulatory Visit (HOSPITAL_COMMUNITY): Payer: Self-pay

## 2020-06-10 ENCOUNTER — Ambulatory Visit: Payer: 59 | Admitting: Internal Medicine

## 2020-06-10 VITALS — BP 104/70 | HR 69 | Ht 66.0 in | Wt 139.0 lb

## 2020-06-10 DIAGNOSIS — L74512 Primary focal hyperhidrosis, palms: Secondary | ICD-10-CM | POA: Insufficient documentation

## 2020-06-10 DIAGNOSIS — F330 Major depressive disorder, recurrent, mild: Secondary | ICD-10-CM | POA: Diagnosis not present

## 2020-06-10 DIAGNOSIS — E559 Vitamin D deficiency, unspecified: Secondary | ICD-10-CM | POA: Diagnosis not present

## 2020-06-10 DIAGNOSIS — L74513 Primary focal hyperhidrosis, soles: Secondary | ICD-10-CM

## 2020-06-10 DIAGNOSIS — M3500 Sicca syndrome, unspecified: Secondary | ICD-10-CM | POA: Diagnosis not present

## 2020-06-10 DIAGNOSIS — F900 Attention-deficit hyperactivity disorder, predominantly inattentive type: Secondary | ICD-10-CM | POA: Diagnosis not present

## 2020-06-10 DIAGNOSIS — L930 Discoid lupus erythematosus: Secondary | ICD-10-CM

## 2020-06-10 DIAGNOSIS — F418 Other specified anxiety disorders: Secondary | ICD-10-CM | POA: Diagnosis not present

## 2020-06-10 MED ORDER — TRAZODONE HCL 100 MG PO TABS
100.0000 mg | ORAL_TABLET | Freq: Every evening | ORAL | 0 refills | Status: DC
  Filled 2020-06-10: qty 30, 30d supply, fill #0

## 2020-06-10 MED ORDER — FLUOXETINE HCL 40 MG PO CAPS
40.0000 mg | ORAL_CAPSULE | Freq: Every day | ORAL | 0 refills | Status: DC
  Filled 2020-06-10: qty 30, 30d supply, fill #0

## 2020-06-10 NOTE — Patient Instructions (Addendum)
I am checking several markers for systemic disease activity to see if there is involvement of body organs from lupus or sjogren's syndrome, if positive we would need to treat for these if negative we can plan treatments based more on symptomatic management. Also checking for any specific evidence of lupus versus sjogren's syndrome.   Anti-DNA Antibody Test Why am I having this test? The anti-DNA antibody test helps with the diagnosis and follow-up of systemic lupus erythematosus (SLE). It is also used to monitor treatment of this condition as the antibody decreases with successful therapy. What is being tested? This test measures the amount of anti-DNA antibody in the blood. This antibody is found in 65-80% of patients with active SLE. This antibody is not as common in patients who have other diseases. What kind of sample is taken? A blood sample is required for this test. It is usually collected by inserting a needle into a blood vessel.   How are the results reported? Your test results will be reported as a value. Your test results may also be reported as positive, intermediate, or negative. Your health care provider will compare your results to normal ranges that were established after testing a large group of people (reference values). Reference values may vary among labs and hospitals. For this test, common reference values are:  Positive: 10 or more international units/mL.  Intermediate: 5-9 international units/mL.  Negative: Less than 5 international units/mL. What do the results mean? Positive results, which are associated with results that are higher than the reference values, may indicate:  Autoimmune disorders such as SLE.  Infectious mononucleosis.  Chronic liver conditions. Intermediate results mean that the anti-DNA antibody levels are higher than normal, but not high enough to be considered positive. Negative results mean that you do not have the anti-DNA antibody that is  associated with these conditions. Talk with your health care provider about what your results mean. Questions to ask your health care provider Ask your health care provider, or the department that is doing the test:  When will my results be ready?  How will I get my results?  What are my treatment options?  What other tests do I need?  What are my next steps? Summary  The anti-DNA antibody test helps with the diagnosis and follow-up of systemic lupus erythematosus (SLE). It is also used to monitor treatment of this condition as the antibody decreases with successful therapy.  This test measures the amount of anti-DNA antibody in the blood.  Elevated levels of anti-DNA antibody can be seen in patients with SLE and certain other conditions.    Complement Assay Test Why am I having this test? Complement refers to a group of proteins that are part of the body's disease-fighting system (immune system). A complement assay test provides information about some or all of these proteins. You may have this test:  To diagnose a lack, or deficiency, of certain complement proteins. Deficiencies can be passed from parent to child (inherited).  To monitor an infection or autoimmune disease.  If you have unexplained inflammation or swelling (edema).  If you have bacterial infections again and again. What is being tested? This test can be used to measure:  Total complement. This is the total number of protein complements in your blood.  The number of each kind of complement in your blood. The nine main kinds of complement are labeled C1 through C9. Some of these complements, such as C3 and C4, are especially important and have many  functions in the body. Depending on why you are having the test, your health care provider may test your total complement or only some individual complements, such as C3 and C4. The total complement assay test may be done before individual complements are  tested. What kind of sample is taken? A blood sample is required for this test. It is usually collected by inserting a needle into a blood vessel.   Tell a health care provider about:  Any allergies you have.  All medicines you are taking, including vitamins, herbs, eye drops, creams, and over-the-counter medicines.  Any blood disorders you have.  Any surgeries you have had.  Any medical conditions you have.  Whether you are pregnant or may be pregnant. How are the results reported? Your results will be reported as a value that tells you how much complement is in your blood. This will be given as units per milliliter of blood (units/mL) or as milligrams per deciliter of blood (mg/dL). Your results may be reported as total complement, or as individual complements, or both. Your health care provider will compare your results to normal ranges that were established after testing a large group of people (reference ranges). Reference ranges may vary among labs and hospitals. For this test, reference ranges for some of the most commonly measured complement assays may be:  Total complement: 30-75 units/mL.  C2: 1-4 mg/dL.  C3: 75-175 mg/dL.  C4: 22-45 units/mL. What do the results mean? Results within reference ranges are considered normal, which means you have a normal amount of complement in your blood. Results that are higher than the reference ranges may be caused by:  Inflammatory disease.  Heart attack.  Cancer. Complement deficiencies, or results lower than the reference ranges, may be caused by:  Certain inherited conditions.  Autoimmune disease.  Certain liver diseases.  Malnutrition.  Certain types of anemia that result in breakdown of red blood cells (hemolytic anemia). Talk with your health care provider about what your results mean. Questions to ask your health care provider Ask your health care provider, or the department that is doing the test:  When will my  results be ready?  How will I get my results?  What are my treatment options?  What other tests do I need?  What are my next steps? Summary  Complement refers to a group of proteins that are part of the body's disease-fighting system (immune system). A complement assay test can provide information about some or all of these proteins.  You may have a complement assay test to help diagnose a complement deficiency, and to monitor some infections or autoimmune disease.  Talk with your health care provider about what your results mean.    Urinalysis Test Why am I having this test? You may have a urinalysis (UA) test:  As part of routine wellness screening.  Before surgery.  During pregnancy. You may also need to have this test if you have:  Kidney disease.  Symptoms of a urinary tract infection (UTI).  Diabetes.  A condition that causes an imbalance in your hormones. What is being tested? A urinalysis is a series of tests done on a sample of your urine. Your kidneys filter your blood to make urine. They get rid of waste products and save the important parts of your blood, such as proteins and minerals (electrolytes). You may need more testing if your UA shows too much:  Protein.  Sugar (glucose).  Blood cells.  Bacteria. What kind of sample is  taken? A urine sample is required for this test. How do I collect samples at home? You usually collect a urine sample by urinating into a clean cup. You may be asked to collect a urine sample first thing in the morning. When collecting a urine sample at home, make sure you:  Use supplies and instructions that you received from the lab.  Collect urine only in the germ-free (sterile) cup that you received from the lab.  Do not let any toilet paper or stool (feces) get into the cup.  Refrigerate the sample until you can return it to the lab.  Return the sample or samples to the lab as told.   Tell a health care provider  about:  All medicines you are taking, including vitamins, herbs, eye drops, creams, and over-the-counter medicines.  Any medical conditions you have.  Whether you are pregnant or may be pregnant. What happens during the test? UA is divided into three parts:  A visual exam of your urine sample to check for color or cloudiness.  A dipstick test to check for: ? Proteins. ? Concentration (specific gravity). ? Acidity (pH). ? Glucose. ? Ketones. These are by-products of your body burning fat for energy instead of sugar. ? A waste product from red blood cells (bilirubin). ? A product of white blood cells (leukocyte esterase). ? A product of bacteria (nitrite). ? Blood.  A microscopic exam to check for: ? Red blood cells. ? White blood cells. ? Tube-shaped proteins (hyaline casts). ? Crystal structures. ? Bacteria. ? Epithelial cells. These are cells that line your urinary tract. ? Yeast. How are the results reported? Some of your test results will be reported as values. Your health care provider will compare your results to normal ranges that were established after testing a large group of people (reference ranges). Reference ranges may vary among different labs and hospitals. For this test, common reference ranges are:  pH: 4.6-8.0 (average, 6.0).  Protein: ? 0-8 mg/dL. ? 50-80 mg/24 hr (at rest). ? Less than 250 mg/24 hr (during exercise).  Specific gravity: ? Adult: 1.005-1.030 (usually, 1.010-1.025). ? Elderly: values decrease with age. ? Newborn: 1.001-1.020.  Nitrites: none.  Ketones: none.  Bilirubin: none.  Urobilinogen: 8.67-5 Ehrlich unit/mL.  Crystals: none.  Casts: none.  Glucose: ? Fresh specimen: none. ? 24-hour specimen: 50-300 mg/24 hr or 0.3-1.7 mmol/day (SI units).  White blood cells (WBCs): 0-4 per low-power field.  WBC casts: none.  Red blood cells (RBCs): Less than or equal to 2.  RBC casts: none.  Epithelial cells: Few or 0-4 per  low-power field.  Bacteria: none.  Yeast: none. Other results may be reported based on the appearance and odor of the sample. For this test, normal results are:  Appearance: clear.  Color: amber yellow.  Odor: aromatic. Still other results may be reported as positive or negative. For this test, normal results are:  Negative for leukocyte esterase. What do the results mean? Many conditions can cause abnormal UA results:  Cloudy urine may be a sign of a UTI.  Acetone odor may indicate a buildup of blood acids in people who have diabetes (diabetic ketoacidosis).  Fecal odor can indicate an abnormal connection (fistula) between the intestine and the bladder.  Ammonia odor can occur after a person holds urine in the bladder for too long.  Pungent odor may indicate a UTI.  Blood in the urine may be a sign of kidney disease, UTI, or other conditions.  White blood cells  may be a sign of a UTI.  Crystals may be a sign of a kidney stone or other kidney disease.  High pH may mean you have a kidney stone, UTI, or kidney disease.  Protein may be a sign of kidney disease, high blood pressure in pregnancy (toxemia), or other conditions.  Glucose may be a sign of diabetes.  Urobilinogen may be a sign of liver disease.  Leukocyte esterase may indicate a UTI.  Nitrites may indicate a UTI. Talk with your health care provider about what your results mean. Questions to ask your health care provider Ask your health care provider, or the department that is doing the test:  When will my results be ready?  How will I get my results?  What are my treatment options?  What other tests do I need?  What are my next steps? Summary  A urinalysis (UA) is a series of tests done on a sample of your urine. The test may be ordered as part of a routine exam, during pregnancy, before surgery, or if you have certain symptoms.  The urinalysis is divided into three parts: a visual exam, a  dipstick test, and a microscopic exam.  Your health care provider will compare your results to normal ranges that were established after testing a large group of people (reference ranges).  Talk with your health care provider about what your results mean.    Erythrocyte Sedimentation Rate Test Why am I having this test? The erythrocyte sedimentation rate (ESR) test is used to help find illnesses related to:  Sudden (acute) or long-term (chronic) infections.  Inflammation.  The body's disease-fighting system attacking healthy cells (autoimmune diseases).  Cancer.  Tissue death. If you have symptoms that may be related to any of these illnesses, your health care provider may do an ESR test before doing more specific tests. If you have an inflammatory immune disease, such as rheumatoid arthritis, you may have this test to help monitor your therapy. What is being tested? This test measures how long it takes for your red blood cells (erythrocytes) to settle in a solution over a certain amount of time (sedimentation rate). When you have an infection or inflammation, your red blood cells clump together and settle faster. The sedimentation rate provides information about how much inflammation is present in the body. What kind of sample is taken? A blood sample is required for this test. It is usually collected by inserting a needle into a blood vessel.   How do I prepare for this test? Follow any instructions from your health care provider about changing or stopping your regular medicines. Tell a health care provider about:  Any allergies you have.  All medicines you are taking, including vitamins, herbs, eye drops, creams, and over-the-counter medicines.  Any blood disorders you have.  Any surgeries you have had.  Any medical conditions you have, such as thyroid or kidney disease.  Whether you are pregnant or may be pregnant. How are the results reported? Your results will be  reported as a value that measures sedimentation rate in millimeters per hour (mm/hr). Your health care provider will compare your results to normal ranges that were established after testing a large group of people (reference values). Reference values may vary among labs and hospitals. For this test, common reference values, which vary by age and gender, are:  Newborn: 0-2 mm/hr.  Child, up to puberty: 0-10 mm/hr.  Female: ? Under 50 years: 0-20 mm/hr. ? 50-85 years: 0-30 mm/hr. ?  Over 85 years: 0-42 mm/hr.  Female: ? Under 50 years: 0-15 mm/hr. ? 50-85 years: 0-20 mm/hr. ? Over 85 years: 0-30 mm/hr. Certain conditions or medicines may cause ESR levels to be falsely lower or higher, such as:  Pregnancy.  Obesity.  Steroids, birth control pills, and blood thinners.  Thyroid or kidney disease. What do the results mean? Results that are within reference values are considered normal, meaning that the level of inflammation in your body is healthy. High ESR levels mean that there is inflammation in your body. You will have more tests to help make a diagnosis. Inflammation may result from many different conditions or injuries. Talk with your health care provider about what your results mean. Questions to ask your health care provider Ask your health care provider, or the department that is doing the test:  When will my results be ready?  How will I get my results?  What are my treatment options?  What other tests do I need?  What are my next steps? Summary  The erythrocyte sedimentation rate (ESR) test is used to help find illnesses associated with sudden (acute) or long-term (chronic) infections, inflammation, autoimmune diseases, cancer, or tissue death.  If you have symptoms that may be related to any of these illnesses, your health care provider may do an ESR test before doing more specific tests. If you have an inflammatory immune disease, such as rheumatoid arthritis, you may  have this test to help monitor your therapy.  This test measures how long it takes for your red blood cells (erythrocytes) to settle in a solution over a certain amount of time (sedimentation rate). This provides information about how much inflammation is present in the body.    Cardiolipin Antibodies Test Why am I having this test? Your health care provider may order a cardiolipin antibodies test if:  You have a condition in which the blood clots in an abnormal way (hypercoagulable state).  You have signs or symptoms of an autoimmune disease, especially systemic lupus erythematosus (SLE).  You have had one or more pregnancy losses (miscarriage). What is being tested? This test measures the level of cardiolipin antibodies, also known as anticardiolipin antibodies, in the blood. Antibodies are proteins made by your body's defense (immune) system. Normal antibodies protect you against germs and other things that can make you sick. Cardiolipin antibodies are abnormal antibodies. They attack fat molecules in the outer layer of body cells and may also attack important blood-clotting cells (platelets). When your immune system attacks normal body cells, it is called an autoimmune reaction. What kind of sample is taken? A blood sample is required for this test. It is usually collected by inserting a needle into a blood vessel.   How do I prepare for this test? Let your health care provider know if you have had a recent infection and if you are taking any medicines. Some infections and medicines can cause your body to develop cardiolipin antibodies. How are the results reported? Your test results will generally be reported as either positive or negative for cardiolipin antibodies. For this test, normal results are:  Negative for cardiolipin antibodies. Other results may be reported as values. Your health care provider will compare your results to normal ranges that were established after testing a  large group of people (reference ranges). Reference ranges may vary among labs and hospitals. For this test, common normal reference ranges are:  Less than 23 GPL (IgG phospholipid units).  Less than 11 MPL (IgM phospholipid  units). What do the results mean?  A negative test result means that no cardiolipin antibodies were found in your blood at the time of the test.  A positive test result, or results that are higher than the normal reference ranges, could mean that you may be at increased risk for: ? Miscarriages (if you become pregnant), low platelet counts, and blood clots. This condition is called antiphospholipid syndrome. ? An autoimmune disease, especially SLE.  A positive result may also be seen in patients who have cancer, AIDS, or an infection, or who take certain medicines. Talk with your health care provider about what your results mean. Questions to ask your health care provider Ask your health care provider, or the department that is doing the test:  When will my results be ready?  How will I get my results?  What are my treatment options?  What other tests do I need?  What are my next steps? Summary  A cardiolipin antibodies test may be done if you have abnormal blood clotting, symptoms of an autoimmune disease, such as SLE, or recurrent miscarriages.  Cardiolipin antibodies are abnormal antibodies. They attack fat molecules in the outer layer of body cells and may also attack platelets.  A positive test result could mean that you are at increased risk for miscarriages (if you become pregnant), low platelet counts, and blood clots. This condition is called antiphospholipid syndrome. This information is not intended to replace advice given to you by your health care provider. Make sure you discuss any questions you have with your health care provider. Document Revised: 06/26/2019 Document Reviewed: 06/26/2019 [employer] Patient Education  [employer].

## 2020-06-10 NOTE — Progress Notes (Signed)
Office Visit Note  Patient: Debbie [name]             Date of Birth: [birth year]           MRN: [id number]             PCP: Bary Leriche, PA-C Referring: Allwardt, Crist Infante, PA-C Visit Date: 06/10/2020 Occupation: Child psychotherapist  Subjective:  New Patient (Initial Visit) (Patient was diagnosed with Sjogren's Syndrome x 6-7 years. Patient was evaluated by a rheumatologist in [place], but never treated. Patient has developed new symptoms more recently- patient complains of extreme fatigue, bilateral hand pain and stiffness, and overall muscle weakness and energy loss. )   History of Present Illness: Debbie [name] is a 30 y.o. female here for evaluation of Sjogren's syndrome.  Symptoms originally began in 2016 with skin rash that was suspected to be cutaneous lupus work-up showing positive ANA and SSA antibodies she never started any specific treatment for this problem.  She has subsequent follow-up in 2019 with [facility] rheumatology that redemonstrated positive test at higher titer of SSA antibodies than before.  Her skin inflammation is less significant than before.  Currently she has new or problems of severe fatigue that is persistent does not improve with rest and bilateral hand pain not associated with a significant amount of swelling redness or warmth.  She has experienced multiple episodes of sharp chest pains while taking deep breaths lasting for up to a few days and these happen every 1 to 3 months.  She has not noticed any positional change in symptoms during these episodes.  She has some eye and mouth dryness but does not routinely use any treatment for this.  She has intermittently ulcers most, located on the gums and buccal surfaces of the mouth.  She experiences pallor and paresthesias in her hands with cold exposure.  However she also has hyperhidrosis and frequently a moderate degree of erythema in her hands and feet associated with this.  She denies any ulcers  pitting or lesions of fingertips and nails.  She denies any alopecia, lymphadenopathy, or history of blood clots.  She currently uses IUD for contraception with no short-term plans for becoming pregnant.  Activities of Daily Living:  Patient reports morning stiffness for several hours.   Patient Reports nocturnal pain.  Difficulty dressing/grooming: Denies Difficulty climbing stairs: Denies Difficulty getting out of chair: Denies Difficulty using hands for taps, buttons, cutlery, and/or writing: Reports  Review of Systems  Constitutional: Positive for fatigue.  HENT: Positive for mouth dryness and nose dryness. Negative for mouth sores.   Eyes: Positive for itching, visual disturbance and dryness. Negative for pain.  Respiratory: Positive for cough, shortness of breath and difficulty breathing. Negative for hemoptysis.   Cardiovascular: Positive for chest pain and palpitations. Negative for swelling in legs/feet.  Gastrointestinal: Positive for abdominal pain, constipation and diarrhea. Negative for blood in stool.  Endocrine: Negative for increased urination.  Genitourinary: Negative for painful urination.  Musculoskeletal: Positive for arthralgias, joint pain, myalgias, muscle weakness, morning stiffness, muscle tenderness and myalgias. Negative for joint swelling.  Skin: Positive for color change, redness and sensitivity to sunlight. Negative for rash.  Allergic/Immunologic: Negative for susceptible to infections.  Neurological: Positive for dizziness, headaches, memory loss and weakness. Negative for numbness.  Hematological: Negative for swollen glands.  Psychiatric/Behavioral: Positive for confusion and sleep disturbance.    PMFS History:  Patient Active Problem List   Diagnosis Date Noted  . Vitamin D deficiency  06/10/2020  . 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 Lupus erythematosus 06/12/2017  . Sjogren's syndrome (HCC) 06/12/2017    Past Medical History:   Diagnosis Date  . Anxiety   . Depression   . Lupus (HCC)   . Sjogren's syndrome (HCC)     Family History  Problem Relation Age of Onset  . Thyroid disease Mother   . Cancer Maternal Grandmother   . Cancer Paternal Grandfather   . Hashimoto's thyroiditis Sister   . Thyroid disease Sister   . Thyroid disease Brother    History reviewed. No pertinent surgical history. Social History   Social History Narrative  . Not on file   Immunization History  Administered Date(s) Administered  . Influenza,inj,Quad PF,6-35 Mos 11/27/2017, 12/04/2018  . PFIZER(Purple Top)SARS-COV-2 Vaccination 03/25/2019, 04/22/2019, 11/28/2019  . Tdap 01/01/2019     Objective: Vital Signs: BP 104/70 (BP Location: Right Arm, Patient Position: Sitting, Cuff Size: Normal)   Pulse 69   Ht 5\' 6"  (1.676 m)   Wt 139 lb (63 kg)   BMI 22.44 kg/m    Physical Exam HENT:     Right Ear: External ear normal.     Left Ear: External ear normal.     Mouth/Throat:     Mouth: Mucous membranes are moist.     Pharynx: Oropharynx is clear.  Eyes:     Conjunctiva/sclera: Conjunctivae normal.  Cardiovascular:     Rate and Rhythm: Normal rate and regular rhythm.  Pulmonary:     Effort: Pulmonary effort is normal.     Breath sounds: Normal breath sounds.  Skin:    General: Skin is warm and dry.     Comments: Diaphoretic palms and soles Mild capillary tortuosity on nailfold capillaroscopy No nail ridging or nail or digital pad pitting  Neurological:     General: No focal deficit present.     Mental Status: She is alert.  Psychiatric:        Mood and Affect: Mood normal.    Musculoskeletal Exam:  Neck full ROM no tenderness Shoulders full ROM no tenderness or swelling Elbows full ROM no tenderness or swelling Wrists full ROM no tenderness or swelling Fingers full ROM no tenderness or swelling Hip normal internal and external rotation without pain, no tenderness to lateral hip palpation Knees full ROM no  tenderness or swelling Ankles full ROM no tenderness or swelling MTPs full ROM no tenderness or swelling  Investigation: No additional findings.  Imaging: No results found.  Recent Labs: No results found for: WBC, HGB, PLT, NA, K, CL, CO2, GLUCOSE, BUN, CREATININE, BILITOT, ALKPHOS, AST, ALT, PROT, ALBUMIN, CALCIUM, GFRAA, QFTBGOLD, QFTBGOLDPLUS  Speciality Comments: No specialty comments available.  Procedures:  No procedures performed Allergies: Sulfamethoxazole-trimethoprim   Assessment / Plan:     Visit Diagnoses: Lupus erythematosus, unspecified form Sjogren's syndrome, with unspecified organ involvement (HCC) - Plan: Anti-DNA antibody, double-stranded, C3 and C4, Beta-2 glycoprotein antibodies, Lupus Anticoagulant Eval w/Reflex, Cardiolipin antibodies, IgG, IgM, IgA, Sedimentation rate, Urinalysis, ANA, CBC with Differential/Platelet  Current symptoms to be consistent with Sjogren's syndrome characterized by mild keratoconjunctivitis, arthralgias, and fatigue.  The described history is possibly suggestive for some episodes of pleuritic chest pain and Raynaud's phenomenon but it is not currently ongoing and slightly unclear.  Cannot confirm diagnosis of systemic lupus from current clinical and serologic criteria.  We will check dsDNA and complement levels also screen for antiphospholipid antibodies.  Checking CBC, sedimentation rate, and urinalysis for evidence of cytopenias or renal  involvement.  Vitamin D deficiency - Plan: VITAMIN D 25 Hydroxy (Vit-D Deficiency, Fractures)  History of vitamin D deficiency she reports previously taking supplement of 4000 units daily with correction to a low normal value.  She does not recall this is not available for review in EHR today.  We will repeat value today to inform dose recommendation going forward.  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  Orders: Orders Placed This Encounter  Procedures  . Anti-DNA antibody, double-stranded  . C3 and C4  . Beta-2 glycoprotein antibodies  . Lupus Anticoagulant Eval w/Reflex  . Cardiolipin antibodies, IgG, IgM, IgA  . Sedimentation rate  . Urinalysis  . VITAMIN D 25 Hydroxy (Vit-D Deficiency, Fractures)  . ANA  . CBC with Differential/Platelet   No orders of the defined types were placed in this encounter.   Follow-Up Instructions: Return in about 2 weeks (around 06/24/2020) for New pt f/u pSS or SLE.   Fuller Plan, MD  Note - This record has been created using [employer].  Chart creation errors have been sought, but may not always  have been located. Such creation errors do not reflect on  the standard of medical care.

## 2020-06-16 ENCOUNTER — Other Ambulatory Visit (HOSPITAL_COMMUNITY): Payer: Self-pay

## 2020-06-16 ENCOUNTER — Encounter (HOSPITAL_COMMUNITY): Payer: Self-pay | Admitting: Emergency Medicine

## 2020-06-16 ENCOUNTER — Other Ambulatory Visit: Payer: Self-pay

## 2020-06-16 ENCOUNTER — Ambulatory Visit (HOSPITAL_COMMUNITY)
Admission: EM | Admit: 2020-06-16 | Discharge: 2020-06-16 | Disposition: A | Discharge status: Home / Self Care | Payer: 59 | Attending: Urgent Care | Admitting: Urgent Care

## 2020-06-16 DIAGNOSIS — R21 Rash and other nonspecific skin eruption: Secondary | ICD-10-CM | POA: Diagnosis not present

## 2020-06-16 DIAGNOSIS — L299 Pruritus, unspecified: Secondary | ICD-10-CM | POA: Diagnosis not present

## 2020-06-16 DIAGNOSIS — M329 Systemic lupus erythematosus, unspecified: Secondary | ICD-10-CM

## 2020-06-16 LAB — CBC WITH DIFFERENTIAL/PLATELET
Absolute Monocytes: 360 cells/uL (ref 200–950)
Basophils Absolute: 30 cells/uL (ref 0–200)
Basophils Relative: 0.6 %
Eosinophils Absolute: 60 cells/uL (ref 15–500)
Eosinophils Relative: 1.2 %
HCT: 39.7 % (ref 35.0–45.0)
Hemoglobin: 13.3 g/dL (ref 11.7–15.5)
Lymphs Abs: 1425 cells/uL (ref 850–3900)
MCH: 29.2 pg (ref 27.0–33.0)
MCHC: 33.5 g/dL (ref 32.0–36.0)
MCV: 87.3 fL (ref 80.0–100.0)
MPV: 11.1 fL (ref 7.5–12.5)
Monocytes Relative: 7.2 %
Neutro Abs: 3125 cells/uL (ref 1500–7800)
Neutrophils Relative %: 62.5 %
Platelets: 252 10*3/uL (ref 140–400)
RBC: 4.55 10*6/uL (ref 3.80–5.10)
RDW: 12.3 % (ref 11.0–15.0)
Total Lymphocyte: 28.5 %
WBC: 5 10*3/uL (ref 3.8–10.8)

## 2020-06-16 LAB — LUPUS ANTICOAGULANT EVAL W/ REFLEX
PTT-LA Screen: 34 s (ref <=40)
dRVVT: 39 s (ref <=45)

## 2020-06-16 LAB — URINALYSIS
Bilirubin Urine: NEGATIVE
Glucose, UA: NEGATIVE
Hgb urine dipstick: NEGATIVE
Ketones, ur: NEGATIVE
Nitrite: POSITIVE — AB
Protein, ur: NEGATIVE
Specific Gravity, Urine: 1.026 (ref 1.001–1.035)
pH: 5.5 (ref 5.0–8.0)

## 2020-06-16 LAB — ANTI-NUCLEAR AB-TITER (ANA TITER): ANA Titer 1: 1:320 {titer} — ABNORMAL HIGH

## 2020-06-16 LAB — BETA-2 GLYCOPROTEIN ANTIBODIES
Beta-2 Glyco 1 IgA: <2 U/mL
Beta-2 Glyco 1 IgM: <2 U/mL
Beta-2 Glyco I IgG: <2 U/mL

## 2020-06-16 LAB — SEDIMENTATION RATE: Sed Rate: 2 mm/h (ref 0–20)

## 2020-06-16 LAB — CARDIOLIPIN ANTIBODIES, IGG, IGM, IGA
Anticardiolipin IgA: <2 APL-U/mL
Anticardiolipin IgG: <2 GPL-U/mL
Anticardiolipin IgM: <2 MPL-U/mL

## 2020-06-16 LAB — C3 AND C4
C3 Complement: 98 mg/dL (ref 83–193)
C4 Complement: 26 mg/dL (ref 15–57)

## 2020-06-16 LAB — ANA: Anti Nuclear Antibody (ANA): POSITIVE — AB

## 2020-06-16 LAB — ANTI-DNA ANTIBODY, DOUBLE-STRANDED: ds DNA Ab: <1 IU/mL

## 2020-06-16 LAB — VITAMIN D 25 HYDROXY (VIT D DEFICIENCY, FRACTURES): Vit D, 25-Hydroxy: 59 ng/mL (ref 30–100)

## 2020-06-16 MED ORDER — PREDNISONE 10 MG PO TABS
30.0000 mg | ORAL_TABLET | Freq: Every day | ORAL | 0 refills | Status: DC
  Filled 2020-06-16: qty 15, 5d supply, fill #0

## 2020-06-16 NOTE — ED Provider Notes (Signed)
[facility]   MRN: [id number] DOB: 26-Mar-1990  Subjective:   Debbie [name] is a 30 y.o. female presenting for 1 day history of acute onset swelling, redness itching of her face and eyelids. Has a history of SLE and Sjorgen's but reports that this has manifested differently than what she is experiencing right now. Denies pain, drainage of pus, vision changes, oral swelling, chest tightness, vomiting. Denies eating any new foods, starting new medications, exposure to poisonous plants, new hygiene products, new cleaning products or detergents. Has used benadryl several times but has only gotten temporary relief.   No current facility-administered medications for this encounter.  Current Outpatient Medications:  .  cholecalciferol (VITAMIN D3) 25 MCG (1000 UNIT) tablet, Take 1,000 Units by mouth daily., Disp: , Rfl:  .  FLUoxetine (PROZAC) 20 MG capsule, Take 20 mg by mouth daily., Disp: , Rfl:  .  FLUoxetine (PROZAC) 40 MG capsule, Take 1 capsule (40 mg total) by mouth daily., Disp: 30 capsule, Rfl: 0 .  hydrOXYzine (ATARAX/VISTARIL) 25 MG tablet, TAKE 1 TABLET BY MOUTH TWICE DAILY AS NEEDED, Disp: 60 tablet, Rfl: 0 .  Multiple Vitamin (MULTIVITAMIN) tablet, Take 1 tablet by mouth daily., Disp: , Rfl:  .  spironolactone (ALDACTONE) 25 MG tablet, Take 1 tablet (25 mg total) by mouth 2 (two) times daily., Disp: 60 tablet, Rfl: 2 .  traZODone (DESYREL) 100 MG tablet, Take 1 tablet (100 mg total) by mouth at bedtime., Disp: 30 tablet, Rfl: 0 .  traZODone (DESYREL) 50 MG tablet, Take 50 mg by mouth at bedtime., Disp: , Rfl:    Allergies  Allergen Reactions  . Sulfamethoxazole-Trimethoprim Rash and Swelling    Past Medical History:  Diagnosis Date  . Anxiety   . Depression   . Lupus (HCC)   . Sjogren's syndrome (HCC)      History reviewed. No pertinent surgical history.  Family History  Problem Relation Age of Onset  . Thyroid disease Mother   . Cancer Maternal  Grandmother   . Cancer Paternal Grandfather   . Hashimoto's thyroiditis Sister   . Thyroid disease Sister   . Thyroid disease Brother     Social History   Tobacco Use  . Smoking status: Former Smoker    Types: Cigarettes  . Smokeless tobacco: Never Used  Vaping Use  . Vaping Use: Never used  Substance Use Topics  . Alcohol use: Yes    Comment: rare  . Drug use: Yes    Types: Marijuana    ROS   Objective:   Vitals: BP 107/71 (BP Location: Right Arm)   Pulse 74   Resp 18   SpO2 100%   Physical Exam Constitutional:      General: She is not in acute distress.    Appearance: Normal appearance. She is well-developed. She is not ill-appearing, toxic-appearing or diaphoretic.  HENT:     Head: Normocephalic and atraumatic.      Right Ear: External ear normal.     Left Ear: External ear normal.     Nose: Nose normal.     Mouth/Throat:     Mouth: Mucous membranes are moist.     Pharynx: Oropharynx is clear. No oropharyngeal exudate or posterior oropharyngeal erythema.     Comments: Airway is patent.  Eyes:     General: No scleral icterus.       Right eye: No discharge.        Left eye: No discharge.  Extraocular Movements: Extraocular movements intact.     Conjunctiva/sclera: Conjunctivae normal.     Pupils: Pupils are equal, round, and reactive to light.  Cardiovascular:     Rate and Rhythm: Normal rate.  Pulmonary:     Effort: Pulmonary effort is normal.  Skin:    General: Skin is warm and dry.  Neurological:     General: No focal deficit present.     Mental Status: She is alert and oriented to person, place, and time.  Psychiatric:        Mood and Affect: Mood normal.        Behavior: Behavior normal.        Thought Content: Thought content normal.        Judgment: Judgment normal.      Assessment and Plan :   PDMP not reviewed this encounter.  1. Rash and nonspecific skin eruption   2. Itching   3. Systemic lupus erythematosus, unspecified SLE  type, unspecified organ involvement status (HCC)     Will manage for suspected allergic rash with prednisone, hydroxyzine. Counseled patient on potential for adverse effects with medications prescribed/recommended today, ER and return-to-clinic precautions discussed, patient verbalized understanding.    Wallis Bamberg, [place] 06/16/20 1437

## 2020-06-16 NOTE — ED Triage Notes (Signed)
Patient presents to [facility] for evaluation after waking up yesterday with swelling and redness around bilateral eyes.  Took Benadryl every 4 hours yesterday without any relief.  Woke up this morning with the same, but since has progressively been spreading across her face.  Face is red in triage.  Denies oral swelling, difficulty breathing.  Last dose of Benadryl at 7am.

## 2020-06-23 NOTE — Progress Notes (Signed)
Office Visit Note  Patient: Debbie [name]             Date of Birth: 08/08/1990           MRN: [id number]             PCP: Bary Leriche, PA-C Referring: Allwardt, Crist Infante, PA-C Visit Date: 06/24/2020   Subjective:  Follow-up (Patient denies changes in symptoms. Patient noticed facial rash after being exposed to the sun for an extended period last week. Patient was evaluated at urgent care and given Prednisone, she finished the course of prednisone with symptom improvement.)   History of Present Illness: Debbie [name] is a 30 y.o. female here for follow up for lupus with clinical features of fatigue, arthralgia, photosensitive rash, oral ulcers, and possible pleurisy. She reports recently having a flare up of skin rash on the face after prolonged time outdoors and saw the ED for this with prednisone taper treatment symptoms subsided. Currently she is back at baseline. Lab testing in last visit demonstrate high positive ANA titer without any hypocomplementemia, increased sed rate, or proteinuria.  She currently uses IUD for contraception with no short-term plans for becoming pregnant.   Review of Systems  Constitutional: Positive for fatigue.  HENT: Positive for mouth dryness. Negative for mouth sores and nose dryness.   Eyes: Positive for pain and itching. Negative for visual disturbance and dryness.  Respiratory: Negative for cough, hemoptysis, shortness of breath and difficulty breathing.   Cardiovascular: Negative for chest pain, palpitations and swelling in legs/feet.  Gastrointestinal: Positive for abdominal pain, constipation and diarrhea. Negative for blood in stool.  Endocrine: Negative for increased urination.  Genitourinary: Negative for painful urination.  Musculoskeletal: Positive for arthralgias, joint pain, myalgias, morning stiffness, muscle tenderness and myalgias. Negative for joint swelling and muscle weakness.  Skin: Positive for redness. Negative  for color change and rash.  Allergic/Immunologic: Negative for susceptible to infections.  Neurological: Positive for weakness. Negative for dizziness, numbness, headaches and memory loss.  Hematological: Negative for swollen glands.  Psychiatric/Behavioral: Negative for confusion and sleep disturbance.    PMFS History:  Patient Active Problem List   Diagnosis Date Noted  . Vitamin D deficiency 06/10/2020  . 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 Lupus erythematosus 06/12/2017    Past Medical History:  Diagnosis Date  . Anxiety   . Depression   . Lupus (HCC)   . Sjogren's syndrome (HCC)     Family History  Problem Relation Age of Onset  . Thyroid disease Mother   . Cancer Maternal Grandmother   . Cancer Paternal Grandfather   . Hashimoto's thyroiditis Sister   . Thyroid disease Sister   . Thyroid disease Brother    History reviewed. No pertinent surgical history. Social History   Social History Narrative  . Not on file   Immunization History  Administered Date(s) Administered  . Influenza,inj,Quad PF,6-35 Mos 11/27/2017, 12/04/2018  . PFIZER(Purple Top)SARS-COV-2 Vaccination 03/25/2019, 04/22/2019, 11/28/2019  . Tdap 01/01/2019     Objective: Vital Signs: BP 107/63 (BP Location: Left Arm, Patient Position: Sitting, Cuff Size: Normal)   Pulse 69   Ht 5\' 6"  (1.676 m)   Wt 142 lb (64.4 kg)   BMI 22.92 kg/m    Physical Exam HENT:     Right Ear: External ear normal.     Left Ear: External ear normal.     Mouth/Throat:     Mouth: Mucous membranes are moist.  Pharynx: Oropharynx is clear.  Eyes:     Conjunctiva/sclera: Conjunctivae normal.  Skin:    General: Skin is warm and dry.     Comments: Facial erythema over nose and bilateral cheeks, without scaling or papules Hyperhidrosis of hands and feet  Neurological:     General: No focal deficit present.     Mental Status: She is alert.  Psychiatric:        Mood and Affect: Mood normal.      Musculoskeletal Exam:  Elbows full ROM no tenderness or swelling Wrists full ROM no tenderness or swelling Fingers full ROM no tenderness or swelling Knees full ROM no tenderness or swelling   Investigation: No additional findings.  Imaging: No results found.  Recent Labs: Lab Results  Component Value Date   WBC 5.0 06/10/2020   HGB 13.3 06/10/2020   PLT 252 06/10/2020    Speciality Comments: No specialty comments available.  Procedures:  No procedures performed Allergies: Sulfamethoxazole-trimethoprim   Assessment / Plan:     Visit Diagnoses: Lupus erythematosus, unspecified form - Plan: hydroxychloroquine (PLAQUENIL) 200 MG tablet, Ambulatory referral to Ophthalmology  Symptoms and serology are very suggestive for systemic lupus although no evidence of renal disease and negative APS abs. Recommend trial of starting HCQ at 200 mg daily for skin and joint disease activity. Discussed sun protection including hats, sleeves, SPF 50 or greater sunscreens to prevent UV damage related inflammation activation. Discussed long term use of HCQ requiring ophthalmology screening she is agreeable and does not have a regular ophthalmologist in system so will refer today.  Orders: Orders Placed This Encounter  Procedures  . Ambulatory referral to Ophthalmology   Meds ordered this encounter  Medications  . hydroxychloroquine (PLAQUENIL) 200 MG tablet    Sig: Take 1 tablet (200 mg total) by mouth daily.    Dispense:  60 tablet    Refill:  0     Follow-Up Instructions: Return in about 2 months (around 08/24/2020) for SLE f/u HCQ start @8  wks.   , MD  Note - This record has been created using [employer].  Chart creation errors have been sought, but may not always  have been located. Such creation errors do not reflect on  the standard of medical care.

## 2020-06-24 ENCOUNTER — Other Ambulatory Visit (HOSPITAL_COMMUNITY): Payer: Self-pay

## 2020-06-24 ENCOUNTER — Other Ambulatory Visit: Payer: Self-pay | Admitting: Physician Assistant

## 2020-06-24 ENCOUNTER — Ambulatory Visit: Payer: 59 | Admitting: Internal Medicine

## 2020-06-24 ENCOUNTER — Encounter: Payer: Self-pay | Admitting: Internal Medicine

## 2020-06-24 ENCOUNTER — Other Ambulatory Visit: Payer: Self-pay

## 2020-06-24 VITALS — BP 107/63 | HR 69 | Ht 66.0 in | Wt 142.0 lb

## 2020-06-24 DIAGNOSIS — E559 Vitamin D deficiency, unspecified: Secondary | ICD-10-CM

## 2020-06-24 DIAGNOSIS — F418 Other specified anxiety disorders: Secondary | ICD-10-CM | POA: Diagnosis not present

## 2020-06-24 DIAGNOSIS — F330 Major depressive disorder, recurrent, mild: Secondary | ICD-10-CM | POA: Diagnosis not present

## 2020-06-24 DIAGNOSIS — L930 Discoid lupus erythematosus: Secondary | ICD-10-CM

## 2020-06-24 DIAGNOSIS — F900 Attention-deficit hyperactivity disorder, predominantly inattentive type: Secondary | ICD-10-CM | POA: Diagnosis not present

## 2020-06-24 MED ORDER — HYDROXYCHLOROQUINE SULFATE 200 MG PO TABS
200.0000 mg | ORAL_TABLET | Freq: Every day | ORAL | 0 refills | Status: DC
  Filled 2020-06-24: qty 60, 60d supply, fill #0

## 2020-06-24 MED ORDER — FLUOXETINE HCL 40 MG PO CAPS
ORAL_CAPSULE | ORAL | 0 refills | Status: DC
  Filled 2020-06-24 – 2020-11-23 (×2): qty 30, 30d supply, fill #0

## 2020-06-24 MED ORDER — TRAZODONE HCL 100 MG PO TABS
ORAL_TABLET | ORAL | 0 refills | Status: DC
  Filled 2020-06-24 – 2020-07-21 (×2): qty 30, 30d supply, fill #0

## 2020-06-25 ENCOUNTER — Other Ambulatory Visit (HOSPITAL_COMMUNITY): Payer: Self-pay

## 2020-06-26 ENCOUNTER — Other Ambulatory Visit (HOSPITAL_COMMUNITY): Payer: Self-pay

## 2020-06-26 ENCOUNTER — Other Ambulatory Visit: Payer: Self-pay | Admitting: Physician Assistant

## 2020-06-29 ENCOUNTER — Other Ambulatory Visit (HOSPITAL_COMMUNITY): Payer: Self-pay

## 2020-06-29 ENCOUNTER — Other Ambulatory Visit: Payer: Self-pay | Admitting: Physician Assistant

## 2020-06-29 MED ORDER — SPIRONOLACTONE 25 MG PO TABS
25.0000 mg | ORAL_TABLET | Freq: Two times a day (BID) | ORAL | 0 refills | Status: DC
  Filled 2020-06-29: qty 180, 90d supply, fill #0

## 2020-06-29 NOTE — Telephone Encounter (Signed)
Please advise 

## 2020-06-29 NOTE — Telephone Encounter (Signed)
Spoke with patient stated she is still taking Rx has not been DC.

## 2020-06-29 NOTE — Telephone Encounter (Signed)
Please call patient and see if she wants to continue this medication.  It is unclear to me if she was supposed to continue taking this, per her rheumatologists's note.

## 2020-06-30 ENCOUNTER — Other Ambulatory Visit (HOSPITAL_COMMUNITY): Payer: Self-pay

## 2020-06-30 MED ORDER — SPIRONOLACTONE 25 MG PO TABS
25.0000 mg | ORAL_TABLET | Freq: Two times a day (BID) | ORAL | 0 refills | Status: DC
  Filled 2020-10-08: qty 180, 90d supply, fill #0

## 2020-06-30 NOTE — Telephone Encounter (Signed)
I've refilled for patient. 

## 2020-06-30 NOTE — Addendum Note (Signed)
Addended by: Haynes Bast on: 06/30/2020 01:42 PM   Modules accepted: Orders

## 2020-07-19 DIAGNOSIS — N760 Acute vaginitis: Secondary | ICD-10-CM | POA: Diagnosis not present

## 2020-07-21 ENCOUNTER — Other Ambulatory Visit (HOSPITAL_COMMUNITY): Payer: Self-pay

## 2020-07-22 ENCOUNTER — Ambulatory Visit: Payer: 59 | Admitting: Registered Nurse

## 2020-07-22 ENCOUNTER — Other Ambulatory Visit (HOSPITAL_COMMUNITY): Payer: Self-pay

## 2020-07-22 ENCOUNTER — Other Ambulatory Visit: Payer: Self-pay

## 2020-07-22 ENCOUNTER — Encounter: Payer: Self-pay | Admitting: Registered Nurse

## 2020-07-22 ENCOUNTER — Other Ambulatory Visit (HOSPITAL_COMMUNITY)
Admission: RE | Admit: 2020-07-22 | Discharge: 2020-07-22 | Disposition: A | Discharge status: Home / Self Care | Payer: 59 | Source: Ambulatory Visit | Attending: Registered Nurse | Admitting: Registered Nurse

## 2020-07-22 VITALS — BP 102/68 | HR 76 | Temp 98.4°F | Resp 18 | Ht 66.0 in | Wt 131.4 lb

## 2020-07-22 DIAGNOSIS — R21 Rash and other nonspecific skin eruption: Secondary | ICD-10-CM | POA: Diagnosis not present

## 2020-07-22 DIAGNOSIS — B379 Candidiasis, unspecified: Secondary | ICD-10-CM

## 2020-07-22 DIAGNOSIS — R300 Dysuria: Secondary | ICD-10-CM | POA: Diagnosis not present

## 2020-07-22 MED ORDER — PREDNISONE 10 MG PO TABS
ORAL_TABLET | ORAL | 0 refills | Status: AC
  Filled 2020-07-22: qty 21, 9d supply, fill #0

## 2020-07-22 NOTE — Patient Instructions (Signed)
° ° ° °  If you have lab work done today you will be contacted with your lab results within the next 2 weeks.  If you have not heard from us then please contact us. The fastest way to get your results is to register for My Chart. ° ° °IF you received an x-ray today, you will receive an invoice from [facility]. Please contact  Radiology at [phone] with questions or concerns regarding your invoice.  ° °IF you received labwork today, you will receive an invoice from [facility]. Please contact [facility] at [phone] with questions or concerns regarding your invoice.  ° °Our billing staff will not be able to assist you with questions regarding bills from these companies. ° °You will be contacted with the lab results as soon as they are available. The fastest way to get your results is to activate your My Chart account. Instructions are located on the last page of this paperwork. If you have not heard from us regarding the results in 2 weeks, please contact this office. °  ° ° ° °

## 2020-07-23 LAB — URINE CYTOLOGY ANCILLARY ONLY
Bacterial Vaginitis-Urine: NEGATIVE
Candida Urine: NEGATIVE

## 2020-07-27 NOTE — Progress Notes (Signed)
[facility] - Clinic Note  07/29/2020     CHIEF COMPLAINT Patient presents for Retina Evaluation   HISTORY OF PRESENT ILLNESS: Debbie [name] is a 30 y.o. female who presents to the clinic today for:   HPI    Retina Evaluation    In both eyes.  I, the attending physician,  performed the HPI with the patient and updated documentation appropriately.          Comments    Artificial tears: 4-5x/day OU Pt reports Hx of Lupus and Sjogrens Pt states her vision is blurry in both eyes--states she has not been wearing her glasses as she should and recently started wearing them--patient states glasses are ~ 30 yrs old.  Pt has seen [facility] in the past but would like a referral to general Ophthalmology.  Pt denies eye pain.  States OU is very dry, secondary to Sjogrens.  Pt denies floaters or fol OU.  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 (History)    pt is here on the referral of Dr. Dimple Casey for a baseline retinal exam for plaquenil use, pt states she was recently dx with lupus and also has Sjogrens, pt is taking 200 mg/day.  Referring physician: Sheliah Hatch MD [address] [place] [address]   HISTORICAL INFORMATION:   Selected notes from the MEDICAL RECORD NUMBER Referred by Dr. Sheliah Hatch for plaquenil exam LEE:  Ocular Hx- PMH- SLE, Sjogrens syndrome    CURRENT MEDICATIONS: No current outpatient medications on file. (Ophthalmic Drugs)   No current facility-administered medications for this visit. (Ophthalmic Drugs)   Current Outpatient Medications (Other)  Medication Sig  . cholecalciferol (VITAMIN D3) 25 MCG (1000 UNIT) tablet Take 1,000 Units by mouth daily.  Marland Kitchen FLUoxetine (PROZAC) 20 MG capsule Take 20 mg by mouth daily. (Patient not taking: No sig reported)  . FLUoxetine (PROZAC) 40 MG capsule Take 1 capsule orally daily  . hydroxychloroquine  (PLAQUENIL) 200 MG tablet Take 1 tablet (200 mg total) by mouth daily.  . hydrOXYzine (ATARAX/VISTARIL) 25 MG tablet TAKE 1 TABLET BY MOUTH TWICE DAILY AS NEEDED  . Multiple Vitamin (MULTIVITAMIN) tablet Take 1 tablet by mouth daily. (Patient not taking: No sig reported)  . predniSONE (DELTASONE) 10 MG tablet Take 4 tablets (40 mg total) by mouth daily with breakfast for 3 days, THEN 2 tablets (20 mg total) daily with breakfast for 3 days, THEN 1 tablet (10 mg total) daily with breakfast for 3 days.  Marland Kitchen spironolactone (ALDACTONE) 25 MG tablet Take 1 tablet (25 mg total) by mouth 2 (two) times daily.  . traZODone (DESYREL) 100 MG tablet Take 1 tablet by mouth at bedtime  . traZODone (DESYREL) 50 MG tablet Take 50 mg by mouth at bedtime. (Patient not taking: No sig reported)   No current facility-administered medications for this visit. (Other)      REVIEW OF SYSTEMS: ROS    Positive for: Endocrine, Eyes   Negative for: Constitutional, Gastrointestinal, Neurological, Skin, Genitourinary, Musculoskeletal, HENT, Cardiovascular, Respiratory, Psychiatric, Allergic/Imm, Heme/Lymph   Last edited by Corrinne Eagle on 07/29/2020  8:19 AM. (History)       ALLERGIES Allergies  Allergen Reactions  . Sulfamethoxazole-Trimethoprim Rash and Swelling    PAST MEDICAL HISTORY Past Medical History:  Diagnosis Date  . Anxiety   . Depression   . Lupus (HCC)   .  Sjogren's syndrome (HCC)    History reviewed. No pertinent surgical history.  FAMILY HISTORY Family History  Problem Relation Age of Onset  . Thyroid disease Mother   . Cancer Maternal Grandmother   . Cancer Paternal Grandfather   . Hashimoto's thyroiditis Sister   . Thyroid disease Sister   . Thyroid disease Brother     SOCIAL HISTORY Social History   Tobacco Use  . Smoking status: Current Some Day Smoker  . Smokeless tobacco: Never Used  . Tobacco comment: Very rarely  Vaping Use  . Vaping Use: Never used  Substance Use  Topics  . Alcohol use: Yes    Comment: Rarely  . Drug use: Yes    Types: Marijuana         OPHTHALMIC EXAM:  Base Eye Exam    Visual Acuity (Snellen - Linear)      Right Left   Dist cc 20/25 -2 20/20 -2   Correction: Glasses       Tonometry (Tonopen, 8:33 AM)      Right Left   Pressure 17 20       Pupils      Dark Light Shape React APD   Right 3 2 Round Brisk 0   Left 3 2 Round Brisk 0       Visual Fields      Left Right    Full Full       Extraocular Movement      Right Left    Full Full       Neuro/Psych    Oriented x3: Yes   Mood/Affect: Normal       Dilation    Both eyes: 1.0% Mydriacyl, 2.5% Phenylephrine @ 8:33 AM        Slit Lamp and Fundus Exam    Slit Lamp Exam      Right Left   Lids/Lashes Normal Normal   Conjunctiva/Sclera White and quiet White and quiet   Cornea Clear Clear   Anterior Chamber deep and clear, no cell or flare deep and clear, no cell or flare   Iris Round and dilated Round and dilated   Lens Clear Clear   Vitreous clear clear       Fundus Exam      Right Left   Disc Pink and Sharp, Compact, temporal PPP Pink and Sharp, Compact, temporal PPP   C/D Ratio 0.1 0.1   Macula Flat, Good foveal reflex, No heme or edema Flat, Good foveal reflex, No heme or edema   Vessels Normal Normal   Periphery Attached, mild lattice IT periphery--almost to ora    Attached, very mild peripheral cystoid degeneration        Refraction    Wearing Rx      Sphere Cylinder Axis   Right -1.50 +0.50 005   Left -1.50 +0.25 145   Age: 48yrs   Type: SVD       Manifest Refraction      Sphere Cylinder Axis Dist VA   Right -2.00 +0.50 005 20/20   Left -2.25 +0.25 145 20/20          IMAGING AND PROCEDURES  Imaging and Procedures for 07/29/2020  OCT, Retina - OU - Both Eyes       Right Eye Quality was good. Central Foveal Thickness: 272. Progression has no prior data. Findings include normal foveal contour, no IRF, no SRF,  vitreomacular adhesion .   Left Eye Quality was good. Central Foveal Thickness: 279. Progression  has no prior data. Findings include normal foveal contour, no SRF, vitreomacular adhesion .   Notes *Images captured and stored on drive  Diagnosis / Impression:  NFP; no IRF/SRF OU  Clinical management:  See below  Abbreviations: NFP - Normal foveal profile. CME - cystoid macular edema. PED - pigment epithelial detachment. IRF - intraretinal fluid. SRF - subretinal fluid. EZ - ellipsoid zone. ERM - epiretinal membrane. ORA - outer retinal atrophy. ORT - outer retinal tubulation. SRHM - subretinal hyper-reflective material. IRHM - intraretinal hyper-reflective material                 ASSESSMENT/PLAN:    ICD-10-CM   1. Lupus erythematosus, unspecified form  L93.0   2. Long-term use of Plaquenil  Z79.899   3. 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 Plaquenil (hydroxychloroquine [HCQ]) use for SLE - normal baseline exam -- no retinal toxicity noted on exam or OCT today - pt taking 200 mg daily - pt reports wt is ~59 kg - 200/59 = 3.3 mg/kg/day - the AAO recommends daily dosing of < 5.0 mg/kg for HCQ -- pt in good range - will monitor - f/u 1 year, DFE, OCT  3. No retinal edema on exam or OCT  4. Lattice degeneration OD - mild lattice IT almost to ora - no RT/RD - discussed findings, prognosis, and treatment options including observation - recommend monitoring for now  Ophthalmic Meds Ordered this visit:  No orders of the defined types were placed in this encounter.      Return in about 1 year (around 07/29/2021) for f/u plaquenil exam, DFE, OCT.  There are no Patient Instructions on file for this visit.   Explained the diagnoses, plan, and follow up with the patient and they expressed understanding.  Patient expressed understanding of the importance of proper follow up care.   This document  serves as a record of services personally performed by Karie Chimera, MD, PhD. It was created on their behalf by Annalee Genta, COMT. The creation of this record is the provider's dictation and/or activities during the visit.  Electronically signed by: Annalee Genta, COMT 07/29/20 12:20 PM  This document serves as a record of services personally performed by Karie Chimera, MD, PhD. It was created on their behalf by Glee Arvin. Manson Passey, OA an ophthalmic technician. The creation of this record is the provider's dictation and/or activities during the visit.    Electronically signed by: Glee Arvin. Manson Passey, [place] 06.08.2022 12:20 PM  Karie Chimera, M.D., Ph.D. Diseases & Surgery of the Retina and Vitreous [facility]  I have reviewed the above documentation for accuracy and completeness, and I agree with the above. Karie Chimera, M.D., Ph.D. 07/29/20 12:20 PM  Abbreviations: M myopia (nearsighted); A astigmatism; H hyperopia (farsighted); P presbyopia; Mrx spectacle prescription;  CTL contact lenses; OD right eye; OS left eye; OU both eyes  XT exotropia; ET esotropia; PEK punctate epithelial keratitis; PEE punctate epithelial erosions; DES dry eye syndrome; MGD meibomian gland dysfunction; ATs artificial tears; PFAT's preservative free artificial tears; NSC nuclear sclerotic cataract; PSC posterior subcapsular cataract; ERM epi-retinal membrane; PVD posterior vitreous detachment; RD retinal detachment; DM diabetes mellitus; DR diabetic retinopathy; NPDR non-proliferative diabetic retinopathy; PDR proliferative diabetic retinopathy; CSME clinically significant macular edema; DME diabetic macular edema; dbh dot blot hemorrhages; CWS cotton wool spot; POAG primary open  angle glaucoma; C/D cup-to-disc ratio; HVF humphrey visual field; GVF goldmann visual field; OCT optical coherence tomography; IOP intraocular pressure; BRVO Branch retinal vein occlusion; CRVO central retinal vein occlusion;  CRAO central retinal artery occlusion; BRAO branch retinal artery occlusion; RT retinal tear; SB scleral buckle; PPV pars plana vitrectomy; VH Vitreous hemorrhage; PRP panretinal laser photocoagulation; IVK intravitreal kenalog; VMT vitreomacular traction; MH Macular hole;  NVD neovascularization of the disc; NVE neovascularization elsewhere; AREDS age related eye disease study; ARMD age related macular degeneration; POAG primary open angle glaucoma; EBMD epithelial/anterior basement membrane dystrophy; ACIOL anterior chamber intraocular lens; IOL intraocular lens; PCIOL posterior chamber intraocular lens; Phaco/IOL phacoemulsification with intraocular lens placement; PRK photorefractive keratectomy; LASIK laser assisted in situ keratomileusis; HTN hypertension; DM diabetes mellitus; COPD chronic obstructive pulmonary disease

## 2020-07-29 ENCOUNTER — Encounter (INDEPENDENT_AMBULATORY_CARE_PROVIDER_SITE_OTHER): Payer: Self-pay | Admitting: Ophthalmology

## 2020-07-29 ENCOUNTER — Ambulatory Visit (INDEPENDENT_AMBULATORY_CARE_PROVIDER_SITE_OTHER): Payer: 59 | Admitting: Ophthalmology

## 2020-07-29 ENCOUNTER — Other Ambulatory Visit: Payer: Self-pay

## 2020-07-29 DIAGNOSIS — Z79899 Other long term (current) drug therapy: Secondary | ICD-10-CM | POA: Diagnosis not present

## 2020-07-29 DIAGNOSIS — L930 Discoid lupus erythematosus: Secondary | ICD-10-CM

## 2020-07-29 DIAGNOSIS — H3581 Retinal edema: Secondary | ICD-10-CM

## 2020-07-29 DIAGNOSIS — H35411 Lattice degeneration of retina, right eye: Secondary | ICD-10-CM | POA: Diagnosis not present

## 2020-08-05 DIAGNOSIS — F900 Attention-deficit hyperactivity disorder, predominantly inattentive type: Secondary | ICD-10-CM | POA: Diagnosis not present

## 2020-08-05 DIAGNOSIS — F330 Major depressive disorder, recurrent, mild: Secondary | ICD-10-CM | POA: Diagnosis not present

## 2020-08-05 DIAGNOSIS — F418 Other specified anxiety disorders: Secondary | ICD-10-CM | POA: Diagnosis not present

## 2020-08-06 ENCOUNTER — Other Ambulatory Visit (HOSPITAL_COMMUNITY): Payer: Self-pay

## 2020-08-06 MED ORDER — TRAZODONE HCL 100 MG PO TABS
ORAL_TABLET | ORAL | 0 refills | Status: DC
  Filled 2020-08-06: qty 90, 90d supply, fill #0

## 2020-08-06 MED ORDER — FLUOXETINE HCL 40 MG PO CAPS
ORAL_CAPSULE | ORAL | 0 refills | Status: DC
  Filled 2020-08-06 – 2020-08-20 (×2): qty 90, 90d supply, fill #0

## 2020-08-10 ENCOUNTER — Other Ambulatory Visit (HOSPITAL_COMMUNITY): Payer: Self-pay

## 2020-08-13 ENCOUNTER — Other Ambulatory Visit (HOSPITAL_COMMUNITY): Payer: Self-pay

## 2020-08-14 ENCOUNTER — Other Ambulatory Visit (HOSPITAL_COMMUNITY): Payer: Self-pay

## 2020-08-18 ENCOUNTER — Other Ambulatory Visit (HOSPITAL_COMMUNITY): Payer: Self-pay

## 2020-08-20 ENCOUNTER — Other Ambulatory Visit: Payer: Self-pay | Admitting: Internal Medicine

## 2020-08-20 ENCOUNTER — Other Ambulatory Visit (HOSPITAL_COMMUNITY): Payer: Self-pay

## 2020-08-20 DIAGNOSIS — L930 Discoid lupus erythematosus: Secondary | ICD-10-CM

## 2020-08-20 MED ORDER — HYDROXYCHLOROQUINE SULFATE 200 MG PO TABS
200.0000 mg | ORAL_TABLET | Freq: Every day | ORAL | 0 refills | Status: DC
  Filled 2020-08-20: qty 60, 60d supply, fill #0

## 2020-08-20 NOTE — Telephone Encounter (Signed)
Last Visit: 06/24/2020 Next Visit: 08/25/2020 Labs: CBC 06/10/2020 Eye exam: 07/29/2020 Dr. Vanessa Barbara- from office note: no retinal toxicity noted on exam or OCT today   Current Dose per office note 06/24/2020: HCQ at 200 mg daily DX: Lupus erythematosus, unspecified form  Last Fill: 06/24/2020  Okay to refill Plaquenil?

## 2020-08-25 ENCOUNTER — Other Ambulatory Visit: Payer: Self-pay

## 2020-08-25 ENCOUNTER — Encounter: Payer: Self-pay | Admitting: Internal Medicine

## 2020-08-25 ENCOUNTER — Ambulatory Visit: Payer: 59 | Admitting: Internal Medicine

## 2020-08-25 ENCOUNTER — Other Ambulatory Visit (HOSPITAL_COMMUNITY): Payer: Self-pay

## 2020-08-25 VITALS — BP 94/58 | HR 69 | Ht 66.0 in | Wt 137.2 lb

## 2020-08-25 DIAGNOSIS — M3501 Sicca syndrome with keratoconjunctivitis: Secondary | ICD-10-CM

## 2020-08-25 DIAGNOSIS — L930 Discoid lupus erythematosus: Secondary | ICD-10-CM

## 2020-08-25 MED ORDER — HYDROXYCHLOROQUINE SULFATE 200 MG PO TABS
300.0000 mg | ORAL_TABLET | Freq: Every day | ORAL | 1 refills | Status: DC
  Filled 2020-08-25 (×2): qty 135, 90d supply, fill #0
  Filled 2020-11-27: qty 135, 90d supply, fill #1

## 2020-08-25 NOTE — Progress Notes (Signed)
Office Visit Note  Patient: Debbie [name]             Date of Birth: 1991-01-15           MRN: [id number]             PCP: Bary Leriche, PA-C Referring: Allwardt, Crist Infante, PA-C Visit Date: 08/25/2020   Subjective:  Follow-up (Patient is doing well overall, taking PLQ 200 mg daily and does notice improvement with joint pain and stiffness. Patient complains of continued mouth, nose, and eye dryness.)   History of Present Illness: Debbie [name] is a 30 y.o. female here for follow up for probable SLE vs sjogren's syndrome here after starting hydroxychloroquine about 10 weeks ago.  She feels that joint pain and stiffness symptoms are somewhat improved after starting the medicine but she has noticed no improvement to dry eyes and mouth that are the same or possibly worse.  She saw Dr. Vanessa Barbara for baseline eye exam that looked okay.  She is using over-the-counter refresh type lubricating tears has been using mouth lozenges and wash at dentist directions.   Previous HPI: 06/24/20 Debbie [name] is a 30 y.o. female here for follow up for lupus with clinical features of fatigue, arthralgia, photosensitive rash, oral ulcers, and possible pleurisy. She reports recently having a flare up of skin rash on the face after prolonged time outdoors and saw the ED for this with prednisone taper treatment symptoms subsided. Currently she is back at baseline. Lab testing in last visit demonstrate high positive ANA titer without any hypocomplementemia, increased sed rate, or proteinuria.  06/10/20 Debbie [name] is a 30 y.o. female here for evaluation of Sjogren's syndrome.  Symptoms originally began in 2016 with skin rash that was suspected to be cutaneous lupus work-up showing positive ANA and SSA antibodies she never started any specific treatment for this problem.  She has subsequent follow-up in 2019 with [facility] rheumatology that redemonstrated positive test at higher titer of SSA  antibodies than before.  Her skin inflammation is less significant than before.  Currently she has new or problems of severe fatigue that is persistent does not improve with rest and bilateral hand pain not associated with a significant amount of swelling redness or warmth.   She has experienced multiple episodes of sharp chest pains while taking deep breaths lasting for up to a few days and these happen every 1 to 3 months.  She has not noticed any positional change in symptoms during these episodes.  She has some eye and mouth dryness but does not routinely use any treatment for this.  She has intermittently ulcers most, located on the gums and buccal surfaces of the mouth.  She experiences pallor and paresthesias in her hands with cold exposure.  However she also has hyperhidrosis and frequently a moderate degree of erythema in her hands and feet associated with this.  She denies any ulcers pitting or lesions of fingertips and nails.  She denies any alopecia, lymphadenopathy, or history of blood clots.   She currently uses IUD for contraception with no short-term plans for becoming pregnant.    Review of Systems  Constitutional:  Negative for fatigue.  HENT:  Positive for mouth dryness and nose dryness. Negative for mouth sores.   Eyes:  Positive for itching and dryness. Negative for pain and visual disturbance.  Respiratory:  Negative for cough, hemoptysis, shortness of breath and difficulty breathing.   Cardiovascular:  Negative for chest pain,  palpitations and swelling in legs/feet.  Gastrointestinal:  Positive for constipation and diarrhea. Negative for abdominal pain and blood in stool.  Endocrine: Positive for increased urination.  Genitourinary:  Negative for painful urination.  Musculoskeletal:  Negative for joint pain, joint pain, joint swelling, myalgias, muscle weakness, morning stiffness, muscle tenderness and myalgias.  Skin:  Positive for redness. Negative for color change and  rash.  Allergic/Immunologic: Negative for susceptible to infections.  Neurological:  Negative for dizziness, numbness, headaches, memory loss and weakness.  Psychiatric/Behavioral:  Negative for confusion and sleep disturbance.    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 No pertinent surgical history. Social History   Social History Narrative   Not on file   Immunization History  Administered Date(s) Administered   Influenza,inj,Quad PF,6-35 Mos 11/27/2017, 12/04/2018   PFIZER(Purple Top)SARS-COV-2 Vaccination 03/25/2019, 04/22/2019, 11/28/2019   Tdap 01/01/2019     Objective: Vital Signs: BP (!) 94/58 (BP Location: Left Arm, Patient Position: Sitting, Cuff Size: Normal)   Pulse 69   Ht 5\' 6"  (1.676 m)   Wt 137 lb 3.2 oz (62.2 kg)   BMI 22.14 kg/m    Physical Exam Skin:    General: Skin is warm and dry.     Comments: Generalized sunburn redness without focal lesions Faint erythematous papules diffusely on the face and few visible telangiectasias on cheeks Hyperhidrosis of hands and feet  Neurological:     General: No focal deficit present.     Mental Status: She is alert.  Psychiatric:        Mood and Affect: Mood normal.     Musculoskeletal Exam:  Elbows full ROM no tenderness or swelling Wrists full ROM no tenderness or swelling Fingers full ROM no tenderness or swelling Knees full ROM no tenderness or swelling Ankles full ROM no tenderness or swelling   Investigation: No additional  findings.  Imaging: OCT, Retina - OU - Both Eyes  Result Date: 07/29/2020 Right Eye Quality was good. Central Foveal Thickness: 272. Progression has no prior data. Findings include normal foveal contour, no IRF, no SRF, vitreomacular adhesion . Left Eye Quality was good. Central Foveal Thickness: 279. Progression has no prior data. Findings include normal foveal contour, no SRF, vitreomacular adhesion . Notes *Images captured and stored on drive Diagnosis / Impression: NFP; no IRF/SRF OU Clinical management: See below Abbreviations: NFP - Normal foveal profile. CME - cystoid macular edema. PED - pigment epithelial detachment. IRF - intraretinal fluid. SRF - subretinal fluid. EZ - ellipsoid zone. ERM - epiretinal membrane. ORA - outer retinal atrophy. ORT - outer retinal tubulation. SRHM - subretinal hyper-reflective material. IRHM - intraretinal hyper-reflective material    Recent Labs: Lab Results  Component Value Date   WBC 5.0 06/10/2020   HGB 13.3 06/10/2020   PLT 252 06/10/2020    Speciality Comments: PLQ Eye Exam- Dr. 06/12/2020, 07/29/2020 Per office note: no retinal toxicity noted on exam or OCT today,  f/u 1 year  Procedures:  No procedures performed Allergies: Sulfamethoxazole-trimethoprim   Assessment / Plan:     Visit Diagnoses: Sjogren's syndrome with keratoconjunctivitis sicca (HCC) - Plan: Sedimentation rate, CBC with Differential/Platelet, COMPLETE METABOLIC PANEL WITH GFR  Symptoms seem to be partially improved since starting the hydroxychloroquine.  We will recheck labs for monitoring 2 months after new medication start.  Recommend increasing hydroxychloroquine dose to 300 mg daily.  Ongoing facial rashes suggestive for rosacea with papules and telangiectasias.  Discussed sicca symptoms are often not highly responsive to systemic therapy although increase hydroxychloroquine might help.  I believe she is not a very good candidate for secretagogues due to existing hyperhidrosis.  We  discussed UV protection considering the recent diffuse sunburn though fortunately there is not seem to be any resultant inflammatory rash.    Orders: Orders Placed This Encounter  Procedures   Sedimentation rate   CBC with Differential/Platelet   COMPLETE METABOLIC PANEL WITH GFR    Meds ordered this encounter  Medications   hydroxychloroquine (PLAQUENIL) 200 MG tablet    Sig: Take 1.5 tablets (300 mg total) by mouth daily.    Dispense:  135 tablet    Refill:  1      Follow-Up Instructions: Return in about 6 months (around 02/25/2021) for pSS on HCQ f/u 54mos.   Fuller Plan, MD  Note - This record has been created using [employer].  Chart creation errors have been sought, but may not always  have been located. Such creation errors do not reflect on  the standard of medical care.

## 2020-08-26 LAB — CBC WITH DIFFERENTIAL/PLATELET
Absolute Monocytes: 389 cells/uL (ref 200–950)
Basophils Absolute: 40 cells/uL (ref 0–200)
Basophils Relative: 0.6 %
Eosinophils Absolute: 33 cells/uL (ref 15–500)
Eosinophils Relative: 0.5 %
HCT: 38.3 % (ref 35.0–45.0)
Hemoglobin: 12.6 g/dL (ref 11.7–15.5)
Lymphs Abs: 1426 cells/uL (ref 850–3900)
MCH: 29.7 pg (ref 27.0–33.0)
MCHC: 32.9 g/dL (ref 32.0–36.0)
MCV: 90.3 fL (ref 80.0–100.0)
MPV: 10.5 fL (ref 7.5–12.5)
Monocytes Relative: 5.9 %
Neutro Abs: 4712 cells/uL (ref 1500–7800)
Neutrophils Relative %: 71.4 %
Platelets: 268 10*3/uL (ref 140–400)
RBC: 4.24 10*6/uL (ref 3.80–5.10)
RDW: 12.1 % (ref 11.0–15.0)
Total Lymphocyte: 21.6 %
WBC: 6.6 10*3/uL (ref 3.8–10.8)

## 2020-08-26 LAB — COMPLETE METABOLIC PANEL WITH GFR
AG Ratio: 1.8 (calc) (ref 1.0–2.5)
ALT: 8 U/L (ref 6–29)
AST: 14 U/L (ref 10–30)
Albumin: 4.2 g/dL (ref 3.6–5.1)
Alkaline phosphatase (APISO): 33 U/L (ref 31–125)
BUN: 13 mg/dL (ref 7–25)
CO2: 29 mmol/L (ref 20–32)
Calcium: 9.6 mg/dL (ref 8.6–10.2)
Chloride: 102 mmol/L (ref 98–110)
Creat: 0.87 mg/dL (ref 0.50–1.10)
GFR, Est African American: 106 mL/min/{1.73_m2} (ref >=60)
GFR, Est Non African American: 91 mL/min/{1.73_m2} (ref >=60)
Globulin: 2.3 g/dL (calc) (ref 1.9–3.7)
Glucose, Bld: 80 mg/dL (ref 65–99)
Potassium: 4.3 mmol/L (ref 3.5–5.3)
Sodium: 139 mmol/L (ref 135–146)
Total Bilirubin: 0.4 mg/dL (ref 0.2–1.2)
Total Protein: 6.5 g/dL (ref 6.1–8.1)

## 2020-08-26 LAB — SEDIMENTATION RATE: Sed Rate: 6 mm/h (ref 0–20)

## 2020-08-27 NOTE — Progress Notes (Signed)
Lab tests look fine after starting the hydroxychloroquine so no problems she can increase this to 300 mg daily as discussed in clinic.

## 2020-09-09 ENCOUNTER — Ambulatory Visit: Payer: 59 | Admitting: Physician Assistant

## 2020-09-13 ENCOUNTER — Emergency Department
Admission: EM | Admit: 2020-09-13 | Discharge: 2020-09-13 | Disposition: A | Discharge status: Home / Self Care | Payer: 59 | Source: Home / Self Care | Attending: Emergency Medicine | Admitting: Emergency Medicine

## 2020-09-13 ENCOUNTER — Other Ambulatory Visit: Payer: Self-pay

## 2020-09-13 ENCOUNTER — Other Ambulatory Visit (HOSPITAL_COMMUNITY)
Admission: RE | Admit: 2020-09-13 | Discharge: 2020-09-13 | Disposition: A | Discharge status: Home / Self Care | Payer: 59 | Source: Ambulatory Visit | Attending: Family Medicine | Admitting: Family Medicine

## 2020-09-13 DIAGNOSIS — N3001 Acute cystitis with hematuria: Secondary | ICD-10-CM

## 2020-09-13 DIAGNOSIS — N760 Acute vaginitis: Secondary | ICD-10-CM

## 2020-09-13 DIAGNOSIS — R300 Dysuria: Secondary | ICD-10-CM

## 2020-09-13 LAB — POCT URINALYSIS DIP (MANUAL ENTRY)
Bilirubin, UA: NEGATIVE
Glucose, UA: NEGATIVE mg/dL
Ketones, POC UA: NEGATIVE mg/dL
Nitrite, UA: POSITIVE — AB
Protein Ur, POC: NEGATIVE mg/dL
Spec Grav, UA: >=1.03 — AB (ref 1.010–1.025)
Urobilinogen, UA: 0.2 E.U./dL
pH, UA: 6 (ref 5.0–8.0)

## 2020-09-13 LAB — POCT URINE PREGNANCY: Preg Test, Ur: NEGATIVE

## 2020-09-13 MED ORDER — NITROFURANTOIN MONOHYD MACRO 100 MG PO CAPS
100.0000 mg | ORAL_CAPSULE | Freq: Two times a day (BID) | ORAL | 0 refills | Status: DC
  Filled 2020-09-13: qty 14, 7d supply, fill #0

## 2020-09-13 MED ORDER — PHENAZOPYRIDINE HCL 200 MG PO TABS: 200.0000 mg | ORAL_TABLET | Freq: Three times a day (TID) | ORAL | 0 refills | Status: DC | PRN

## 2020-09-13 MED ORDER — MICONAZOLE NITRATE 2 % EX CREA
1.0000 "application " | TOPICAL_CREAM | Freq: Two times a day (BID) | CUTANEOUS | 0 refills | Status: DC
  Filled 2020-09-13: qty 28.35, 14d supply, fill #0

## 2020-09-13 MED ORDER — PHENAZOPYRIDINE HCL 200 MG PO TABS
200.0000 mg | ORAL_TABLET | Freq: Three times a day (TID) | ORAL | 0 refills | Status: DC | PRN
  Filled 2020-09-13: qty 6, 2d supply, fill #0

## 2020-09-13 MED ORDER — METRONIDAZOLE 500 MG PO TABS
500.0000 mg | ORAL_TABLET | Freq: Two times a day (BID) | ORAL | 0 refills | Status: AC
  Filled 2020-09-13: qty 14, 7d supply, fill #0

## 2020-09-13 MED ORDER — FLUCONAZOLE 150 MG PO TABS
150.0000 mg | ORAL_TABLET | Freq: Once | ORAL | 1 refills | Status: AC
  Filled 2020-09-13: qty 2, 3d supply, fill #0

## 2020-09-13 NOTE — ED Provider Notes (Signed)
HPI  SUBJECTIVE:  Debbie [name] is a 30 y.o. female who presents with 1 month of vaginal irritation/discomfort since being treated for a yeast infection.  She reports dysuria, vulvar swelling, erythema.  She reports hematuria, vaginal odor and some abnormal vaginal bleeding described as spotting.  No genital rash, urinary urgency, frequency, cloudy odorous urine.  No vomiting, fevers, abdominal, back, pelvic pain.  No vaginal discharge.  She reports vulvar pain with intercourse.  She is in a long-term monogamous relationship with her husband, who became symptomatic yesterday with dysuria, penile erythema.  States that they had sex yesterday morning.  She has never had symptoms like this before.  No alleviating factors.  Has not tried anything for this.  Symptoms are worse with intercourse.  She has a past medical history of BV, yeast, Sjogren's syndrome.  No history of STDs, diabetes.  LMP, amenorrheic.  Has an IUD.  Denies the possibility of being pregnant.  PMD: [facility] [place]   Past Medical History:  Diagnosis Date   Anxiety    Depression    Lupus (HCC)    Sjogren's syndrome (HCC)     History reviewed. No pertinent surgical history.  Family History  Problem Relation Age of Onset   Thyroid disease Mother    Cancer Maternal Grandmother    Cancer Paternal Grandfather    Hashimoto's thyroiditis Sister    Thyroid disease Sister    Thyroid disease Brother     Social History   Tobacco Use   Smoking status: Some Days    Types: Cigarettes   Smokeless tobacco: Never   Tobacco comments:    Very rarely, social smoker  Vaping Use   Vaping Use: Some days  Substance Use Topics   Alcohol use: Yes    Comment: Rarely   Drug use: Yes    Types: Marijuana    No current facility-administered medications for this encounter.  Current Outpatient Medications:    fluconazole (DIFLUCAN) 150 MG tablet, Take 1 tablet (150 mg total) by mouth once for 1 dose. May  repeat in 72 hours if no improvement, Disp: 2 tablet, Rfl: 1   metroNIDAZOLE (FLAGYL) 500 MG tablet, Take 1 tablet (500 mg total) by mouth 2 (two) times daily for 7 days. (no alcohol), Disp: 14 tablet, Rfl: 0   miconazole (MICOTIN) 2 % cream, Apply 1 application topically 2 (two) times daily., Disp: 28.35 g, Rfl: 0   nitrofurantoin, macrocrystal-monohydrate, (MACROBID) 100 MG capsule, Take 1 capsule (100 mg total) by mouth 2 (two) times daily., Disp: 14 capsule, Rfl: 0   cholecalciferol (VITAMIN D3) 25 MCG (1000 UNIT) tablet, Take 1,000 Units by mouth daily., Disp: , Rfl:    FLUoxetine (PROZAC) 40 MG capsule, Take 1 capsule orally daily, Disp: 30 capsule, Rfl: 0   hydroxychloroquine (PLAQUENIL) 200 MG tablet, Take 1 and 1/2 tablets (300 mg total) by mouth daily., Disp: 135 tablet, Rfl: 1   hydrOXYzine (ATARAX/VISTARIL) 25 MG tablet, TAKE 1 TABLET BY MOUTH TWICE DAILY AS NEEDED, Disp: 60 tablet, Rfl: 0   phenazopyridine (PYRIDIUM) 200 MG tablet, Take 1 tablet (200 mg total) by mouth 3 (three) times daily as needed for pain., Disp: 6 tablet, Rfl: 0   spironolactone (ALDACTONE) 25 MG tablet, Take 1 tablet (25 mg total) by mouth 2 (two) times daily., Disp: 180 tablet, Rfl: 0   traZODone (DESYREL) 100 MG tablet, Take 1 tablet by mouth at bedtime, Disp: 90 tablet, Rfl: 0  Allergies  Allergen Reactions  Sulfamethoxazole-Trimethoprim Rash and Swelling     ROS  As noted in HPI.   Physical Exam  BP 103/67   Pulse 70   Temp 98.5 F (36.9 C) (Oral)   Resp 12   Ht 5\' 6"  (1.676 m)   Wt 63.5 kg   SpO2 98%   BMI 22.60 kg/m   Constitutional: Well developed, well nourished, no acute distress Eyes:  EOMI, conjunctiva normal bilaterally HENT: Normocephalic, atraumatic,mucus membranes moist Respiratory: Normal inspiratory effort Cardiovascular: Normal rate GI: nondistended soft, nontender. No suprapubic, flank tenderness  back: No CVA tenderness GU:  deferred skin: No rash, skin  intact Musculoskeletal: no deformities Neurologic: Alert & oriented x 3, no focal neuro deficits Psychiatric: Speech and behavior appropriate   ED Course   Medications - No data to display  Orders Placed This Encounter  Procedures   Urine Culture    Standing Status:   Standing    Number of Occurrences:   1    Order Specific Question:   Indication    Answer:   Dysuria    Order Specific Question:   Release to patient    Answer:   Immediate   POCT urinalysis dipstick    Standing Status:   Standing    Number of Occurrences:   1   POCT urine pregnancy    Standing Status:   Standing    Number of Occurrences:   1    Results for orders placed or performed during the hospital encounter of 09/13/20 (from the past 24 hour(s))  POCT urinalysis dipstick     Status: Abnormal   Collection Time: 09/13/20  5:04 PM  Result Value Ref Range   Color, UA yellow yellow   Clarity, UA cloudy (A) clear   Glucose, UA negative negative mg/dL   Bilirubin, UA negative negative   Ketones, POC UA negative negative mg/dL   Spec Grav, UA 09/15/20 (A) 1.010 - 1.025   Blood, UA small (A) negative   pH, UA 6.0 5.0 - 8.0   Protein Ur, POC negative negative mg/dL   Urobilinogen, UA 0.2 0.2 or 1.0 E.U./dL   Nitrite, UA Positive (A) Negative   Leukocytes, UA Trace (A) Negative  POCT urine pregnancy     Status: None   Collection Time: 09/13/20  5:04 PM  Result Value Ref Range   Preg Test, Ur Negative Negative   No results found.  ED Clinical Impression  1. Vaginitis and vulvovaginitis   2. Dysuria   3. Acute cystitis with hematuria     ED Assessment/Plan  Suspect BV.  She has a UTI on UA with positive nitrites and trace leukocytes, hematuria.  Will send home with Macrobid and Pyridium.  Sending urine off for culture to confirm diagnosis of UTI and antibiotic choice.  Will send home with miconazole, Flagyl, wait-and-see prescription of Diflucan.  She has MyChart.  If she does not get better with all  of these, or if all of her labs are negative, she will need to follow-up with the provider managing her sjoren's syndrome as these symptoms could be related.  Discussed labs, medical decision making, treatment plan and plan for follow-up with patient.  She agrees with plan.  Meds ordered this encounter  Medications   miconazole (MICOTIN) 2 % cream    Sig: Apply 1 application topically 2 (two) times daily.    Dispense:  28.35 g    Refill:  0   metroNIDAZOLE (FLAGYL) 500 MG tablet    Sig: Take  1 tablet (500 mg total) by mouth 2 (two) times daily for 7 days. (no alcohol)    Dispense:  14 tablet    Refill:  0   fluconazole (DIFLUCAN) 150 MG tablet    Sig: Take 1 tablet (150 mg total) by mouth once for 1 dose. May repeat in 72 hours if no improvement    Dispense:  2 tablet    Refill:  1   nitrofurantoin, macrocrystal-monohydrate, (MACROBID) 100 MG capsule    Sig: Take 1 capsule (100 mg total) by mouth 2 (two) times daily.    Dispense:  14 capsule    Refill:  0   DISCONTD: phenazopyridine (PYRIDIUM) 200 MG tablet    Sig: Take 1 tablet (200 mg total) by mouth 3 (three) times daily as needed for pain.    Dispense:  6 tablet    Refill:  0   phenazopyridine (PYRIDIUM) 200 MG tablet    Sig: Take 1 tablet (200 mg total) by mouth 3 (three) times daily as needed for pain.    Dispense:  6 tablet    Refill:  0    *This clinic note was created using Scientist, clinical (histocompatibility and immunogenetics). Therefore, there may be occasional mistakes despite careful proofreading.  ?     Domenick Gong, MD 09/14/20 [id number]

## 2020-09-13 NOTE — ED Triage Notes (Signed)
Pt seen in UC w/ c/o vaginal irritation, dysuria, pain with intercourse. Pt states sx began after having yeast infection greater than a month ago. Pt states her husband is also experiencing similar symptoms.

## 2020-09-13 NOTE — Discharge Instructions (Addendum)
I suspect that you have BV.  Go ahead and start the Flagyl and miconazole external cream.  Macrobid for a UTI and Pyridium for the urinary symptoms.  If your testing comes back positive for yeast, then take the Diflucan.  May discontinue the Macrobid if your urine culture comes back negative for UTI.

## 2020-09-14 ENCOUNTER — Other Ambulatory Visit (HOSPITAL_COMMUNITY): Payer: Self-pay

## 2020-09-15 LAB — CERVICOVAGINAL ANCILLARY ONLY
Bacterial Vaginitis (gardnerella): NEGATIVE
Candida Glabrata: NEGATIVE
Candida Vaginitis: POSITIVE — AB
Chlamydia: NEGATIVE
Comment: NEGATIVE
Comment: NEGATIVE
Comment: NEGATIVE
Comment: NEGATIVE
Comment: NEGATIVE
Comment: NORMAL
Neisseria Gonorrhea: NEGATIVE
Trichomonas: NEGATIVE

## 2020-09-16 LAB — URINE CULTURE
MICRO NUMBER:: 12157504
SPECIMEN QUALITY:: ADEQUATE

## 2020-10-06 ENCOUNTER — Other Ambulatory Visit: Payer: Self-pay

## 2020-10-06 ENCOUNTER — Other Ambulatory Visit (HOSPITAL_COMMUNITY): Payer: Self-pay

## 2020-10-06 ENCOUNTER — Encounter: Payer: Self-pay | Admitting: Physician Assistant

## 2020-10-06 ENCOUNTER — Ambulatory Visit: Payer: 59 | Admitting: Physician Assistant

## 2020-10-06 VITALS — BP 100/69 | HR 61 | Temp 97.6°F | Ht 66.0 in | Wt 135.2 lb

## 2020-10-06 DIAGNOSIS — R300 Dysuria: Secondary | ICD-10-CM | POA: Diagnosis not present

## 2020-10-06 DIAGNOSIS — B373 Candidiasis of vulva and vagina: Secondary | ICD-10-CM

## 2020-10-06 DIAGNOSIS — B3731 Acute candidiasis of vulva and vagina: Secondary | ICD-10-CM

## 2020-10-06 LAB — POC URINALSYSI DIPSTICK (AUTOMATED)
Bilirubin, UA: NEGATIVE
Blood, UA: NEGATIVE
Glucose, UA: NEGATIVE
Ketones, UA: NEGATIVE
Nitrite, UA: NEGATIVE
Protein, UA: POSITIVE — AB
Spec Grav, UA: >=1.03 — AB (ref 1.010–1.025)
Urobilinogen, UA: 0.2 E.U./dL
pH, UA: 6 (ref 5.0–8.0)

## 2020-10-06 MED ORDER — FLUCONAZOLE 150 MG PO TABS
ORAL_TABLET | ORAL | 0 refills | Status: DC
  Filled 2020-10-06: qty 2, 7d supply, fill #0

## 2020-10-06 NOTE — Progress Notes (Signed)
Acute Office Visit  Subjective:    Patient ID: Debbie [name], female    DOB: 01/23/1991, 30 y.o.   MRN: [id number]  Chief Complaint  Patient presents with   Urinary Frequency    HPI Patient is in today for urinary frequency, dysuria, some vaginal itching and discharge x 2-3 days. Treated for similar symptoms 09/13/20 at urgent care with Diflucan and Macrobid, but states symptoms never truly went away completely. Sexually active with husband, monogamous, noticing recurrence of issues after intercourse. Debbie [name] also had two flare-ups of SLE this summer and says Debbie [name] has had problems since then. Debbie [name] has IUD currently, so LMP was several years ago.     No fever or chills. No back pain, N/V/D, no lower abdominal pain.   Past Medical History:  Diagnosis Date   Anxiety    Depression    Lupus (HCC)    Sjogren's syndrome (HCC)     History reviewed. No pertinent surgical history.  Family History  Problem Relation Age of Onset   Thyroid disease Mother    Cancer Maternal Grandmother    Cancer Paternal Grandfather    Hashimoto's thyroiditis Sister    Thyroid disease Sister    Thyroid disease Brother     Social History   Socioeconomic History   Marital status: Married    Spouse name: Not on file   Number of children: Not on file   Years of education: Not on file   Highest education level: Not on file  Occupational History   Not on file  Tobacco Use   Smoking status: Some Days    Types: Cigarettes   Smokeless tobacco: Never   Tobacco comments:    Very rarely, social smoker  Vaping Use   Vaping Use: Some days  Substance and Sexual Activity   Alcohol use: Yes    Comment: Rarely   Drug use: Yes    Types: Marijuana   Sexual activity: Not on file  Other Topics Concern   Not on file  Social History Narrative   Not on file   Social Determinants of Health   Financial Resource Strain: Not on file  Food Insecurity: Not on file  Transportation Needs: Not on file  Physical  Activity: Not on file  Stress: Not on file  Social Connections: Not on file  Intimate Partner Violence: Not on file    Outpatient Medications Prior to Visit  Medication Sig Dispense Refill   cholecalciferol (VITAMIN D3) 25 MCG (1000 UNIT) tablet Take 1,000 Units by mouth daily.     FLUoxetine (PROZAC) 40 MG capsule Take 1 capsule orally daily 30 capsule 0   hydroxychloroquine (PLAQUENIL) 200 MG tablet Take 1 and 1/2 tablets (300 mg total) by mouth daily. 135 tablet 1   hydrOXYzine (ATARAX/VISTARIL) 25 MG tablet TAKE 1 TABLET BY MOUTH TWICE DAILY AS NEEDED 60 tablet 0   miconazole (MICOTIN) 2 % cream Apply 1 application topically 2 (two) times daily. 28.35 g 0   spironolactone (ALDACTONE) 25 MG tablet Take 1 tablet (25 mg total) by mouth 2 (two) times daily. 180 tablet 0   traZODone (DESYREL) 100 MG tablet Take 1 tablet by mouth at bedtime 90 tablet 0   nitrofurantoin, macrocrystal-monohydrate, (MACROBID) 100 MG capsule Take 1 capsule (100 mg total) by mouth 2 (two) times daily. 14 capsule 0   phenazopyridine (PYRIDIUM) 200 MG tablet Take 1 tablet (200 mg total) by mouth 3 (three) times daily as needed for pain. 6 tablet 0  No facility-administered medications prior to visit.    Allergies  Allergen Reactions   Sulfamethoxazole-Trimethoprim Rash and Swelling    Review of Systems REFER TO HPI FOR PERTINENT POSITIVES AND NEGATIVES     Objective:    Physical Exam Vitals and nursing note reviewed.  Constitutional:      General: Debbie [name] is not in acute distress.    Appearance: Normal appearance. Debbie [name] is not ill-appearing.  HENT:     Head: Normocephalic and atraumatic.  Cardiovascular:     Rate and Rhythm: Normal rate and regular rhythm.     Pulses: Normal pulses.     Heart sounds: Normal heart sounds.  Pulmonary:     Effort: Pulmonary effort is normal.     Breath sounds: Normal breath sounds.  Abdominal:     General: Abdomen is flat. Bowel sounds are normal.     Palpations:  Abdomen is soft.     Tenderness: There is no right CVA tenderness or left CVA tenderness.  Skin:    General: Skin is warm and dry.  Neurological:     General: No focal deficit present.     Mental Status: Debbie [name] is alert.  Psychiatric:        Mood and Affect: Mood normal.    BP 100/69   Pulse 61   Temp 97.6 F (36.4 C)   Ht 5\' 6"  (1.676 m)   Wt 135 lb 3.2 oz (61.3 kg)   SpO2 100%   BMI 21.82 kg/m  Wt Readings from Last 3 Encounters:  10/06/20 135 lb 3.2 oz (61.3 kg)  09/13/20 140 lb (63.5 kg)  08/25/20 137 lb 3.2 oz (62.2 kg)    Health Maintenance Due  Topic Date Due   Pneumococcal Vaccine 33-73 Years old (1 - PCV) Never done   COVID-19 Vaccine (4 - Booster for Pfizer series) 02/28/2020   INFLUENZA VACCINE  09/21/2020    There are no preventive care reminders to display for this patient.   No results found for: TSH Lab Results  Component Value Date   WBC 6.6 08/25/2020   HGB 12.6 08/25/2020   HCT 38.3 08/25/2020   MCV 90.3 08/25/2020   PLT 268 08/25/2020   Lab Results  Component Value Date   NA 139 08/25/2020   K 4.3 08/25/2020   CO2 29 08/25/2020   GLUCOSE 80 08/25/2020   BUN 13 08/25/2020   CREATININE 0.87 08/25/2020   BILITOT 0.4 08/25/2020   AST 14 08/25/2020   ALT 8 08/25/2020   PROT 6.5 08/25/2020   CALCIUM 9.6 08/25/2020   No results found for: CHOL No results found for: HDL No results found for: LDLCALC No results found for: TRIG No results found for: CHOLHDL No results found for: 10/26/2020     Assessment & Plan:   Problem List Items Addressed This Visit   None Visit Diagnoses     Dysuria    -  Primary   Relevant Orders   POCT Urinalysis Dipstick (Automated) (Completed)   Urine Culture   Vaginal candida       Relevant Medications   fluconazole (DIFLUCAN) 150 MG tablet        Meds ordered this encounter  Medications   fluconazole (DIFLUCAN) 150 MG tablet    Sig: Take one tablet today. Repeat dose after completion of antibiotic  therapy.    Dispense:  2 tablet    Refill:  0   Plan: I personally reviewed her prior UC note. Her urine was positive for  klebsiella infection and Debbie [name] was positive for yeast. Will treat with Diflucan at this time. Then culture urine and will treat with antibiotics if necessary. Assuming Debbie [name] does need antibiotic therapy for UTI, Debbie [name] will repeat Diflucan dose. AZO OTC is ok for symptoms relief. Debbie [name] knows to push fluids. We also discussed Macrobid s/p intercourse, which Debbie [name] wants to think about.  Anjeli Casad M Merlon Alcorta, PA-C

## 2020-10-06 NOTE — Patient Instructions (Addendum)
-  Take AZO OTC for symptom relief the next two days until urinary culture report comes back. -You may take one tablet of Diflucan at this time to treat for probable yeast infection. You will need to repeat this dose after completion of antibiotics (if necessary based on urine report). -Drink plenty of fluids. -Consider Macrobid antibiotic dosing after sex if recurrences continue

## 2020-10-08 ENCOUNTER — Other Ambulatory Visit (HOSPITAL_COMMUNITY): Payer: Self-pay

## 2020-10-08 LAB — URINE CULTURE
MICRO NUMBER:: 12249005
SPECIMEN QUALITY:: ADEQUATE

## 2020-10-19 ENCOUNTER — Other Ambulatory Visit: Payer: Self-pay

## 2020-10-19 ENCOUNTER — Ambulatory Visit (INDEPENDENT_AMBULATORY_CARE_PROVIDER_SITE_OTHER): Payer: 59 | Admitting: Physician Assistant

## 2020-10-19 ENCOUNTER — Encounter: Payer: Self-pay | Admitting: Physician Assistant

## 2020-10-19 VITALS — BP 91/58 | HR 62 | Temp 98.1°F | Ht 66.0 in | Wt 134.4 lb

## 2020-10-19 DIAGNOSIS — Z0000 Encounter for general adult medical examination without abnormal findings: Secondary | ICD-10-CM | POA: Diagnosis not present

## 2020-10-19 NOTE — Progress Notes (Signed)
Established Patient Office Visit  Subjective:  Patient ID: Debbie [name], female    DOB: 26-Oct-1990  Age: 30 y.o. MRN: [id number]  CC:  Chief Complaint  Patient presents with   Annual Exam    HPI Debbie [name] presents for annual exam.  Acute concerns: No concerns today   Health maintenance: Lifestyle/ exercise: She has been hiking Nutrition: She has had better diet lately and trying to cook Mental health: Still taking Prozac 40 mg daily and doing well  Caffeine: 20 oz coffee, usually done by noon Sleep: No issues Substance use: Occasional THC, social alcohol; no concerns  Sexual activity: Monogamous  Immunizations: UTD  Pap: 05/05/20, normal Menstrual cycles: IUD, no cycles, no problems    Past Medical History:  Diagnosis Date   Anxiety    Depression    Lupus (HCC)    Sjogren's syndrome (HCC)     History reviewed. No pertinent surgical history.  Family History  Problem Relation Age of Onset   Thyroid disease Mother    Cancer Maternal Grandmother    Cancer Paternal Grandfather    Hashimoto's thyroiditis Sister    Thyroid disease Sister    Thyroid disease Brother     Social History   Socioeconomic History   Marital status: Married    Spouse name: Not on file   Number of children: Not on file   Years of education: Not on file   Highest education level: Not on file  Occupational History   Not on file  Tobacco Use   Smoking status: Some Days    Types: Cigarettes   Smokeless tobacco: Never   Tobacco comments:    Very rarely, social smoker  Vaping Use   Vaping Use: Some days  Substance and Sexual Activity   Alcohol use: Yes    Comment: Rarely   Drug use: Yes    Types: Marijuana   Sexual activity: Not on file  Other Topics Concern   Not on file  Social History Narrative   Not on file   Social Determinants of Health   Financial Resource Strain: Not on file  Food Insecurity: Not on file  Transportation Needs: Not on file  Physical  Activity: Not on file  Stress: Not on file  Social Connections: Not on file  Intimate Partner Violence: Not on file    Outpatient Medications Prior to Visit  Medication Sig Dispense Refill   cholecalciferol (VITAMIN D3) 25 MCG (1000 UNIT) tablet Take 1,000 Units by mouth daily.     FLUoxetine (PROZAC) 40 MG capsule Take 1 capsule orally daily 30 capsule 0   hydroxychloroquine (PLAQUENIL) 200 MG tablet Take 1 and 1/2 tablets (300 mg total) by mouth daily. 135 tablet 1   hydrOXYzine (ATARAX/VISTARIL) 25 MG tablet TAKE 1 TABLET BY MOUTH TWICE DAILY AS NEEDED 60 tablet 0   spironolactone (ALDACTONE) 25 MG tablet Take 1 tablet (25 mg total) by mouth 2 (two) times daily. 180 tablet 0   traZODone (DESYREL) 100 MG tablet Take 1 tablet by mouth at bedtime 90 tablet 0   fluconazole (DIFLUCAN) 150 MG tablet Take one tablet today. Repeat dose after completion of antibiotic therapy. 2 tablet 0   miconazole (MICOTIN) 2 % cream Apply 1 application topically 2 (two) times daily. 28.35 g 0   No facility-administered medications prior to visit.    Allergies  Allergen Reactions   Sulfamethoxazole-Trimethoprim Rash and Swelling    ROS Review of Systems  Constitutional:  Negative for activity change,  appetite change, fever and unexpected weight change.  HENT:  Negative for congestion.   Eyes:  Negative for visual disturbance.  Respiratory:  Negative for apnea, cough and shortness of breath.   Cardiovascular:  Negative for chest pain, palpitations and leg swelling.  Gastrointestinal:  Negative for abdominal pain, blood in stool, constipation and diarrhea.  Endocrine: Negative for polydipsia, polyphagia and polyuria.  Genitourinary:  Negative for dysuria and pelvic pain.  Musculoskeletal:  Negative for arthralgias.  Skin:  Negative for rash.  Neurological:  Negative for dizziness, weakness and headaches.  Hematological:  Negative for adenopathy. Does not bruise/bleed easily.  Psychiatric/Behavioral:   Negative for sleep disturbance and suicidal ideas. The patient is not nervous/anxious.      Objective:    Physical Exam Vitals and nursing note reviewed.  Constitutional:      General: She is not in acute distress.    Appearance: Normal appearance. She is normal weight.  HENT:     Head: Normocephalic.     Right Ear: External ear normal.     Left Ear: External ear normal.     Nose: Nose normal.     Mouth/Throat:     Mouth: Mucous membranes are moist.  Eyes:     Extraocular Movements: Extraocular movements intact.     Conjunctiva/sclera: Conjunctivae normal.     Pupils: Pupils are equal, round, and reactive to light.  Cardiovascular:     Rate and Rhythm: Normal rate and regular rhythm.     Pulses: Normal pulses.     Heart sounds: No murmur heard. Pulmonary:     Effort: Pulmonary effort is normal.     Breath sounds: Normal breath sounds.  Abdominal:     General: Abdomen is flat. Bowel sounds are normal.     Palpations: Abdomen is soft.     Tenderness: There is no abdominal tenderness.  Musculoskeletal:        General: Normal range of motion.     Cervical back: Normal range of motion.  Skin:    General: Skin is warm.  Neurological:     General: No focal deficit present.     Mental Status: She is alert and oriented to person, place, and time.     Gait: Gait normal.  Psychiatric:        Mood and Affect: Mood normal.        Behavior: Behavior normal.    BP (!) 91/58   Pulse 62   Temp 98.1 F (36.7 C) (Temporal)   Ht 5\' 6"  (1.676 m)   Wt 134 lb 6.4 oz (61 kg)   SpO2 97%   BMI 21.69 kg/m  Wt Readings from Last 3 Encounters:  10/19/20 134 lb 6.4 oz (61 kg)  10/06/20 135 lb 3.2 oz (61.3 kg)  09/13/20 140 lb (63.5 kg)     Health Maintenance Due  Topic Date Due   COVID-19 Vaccine (4 - Booster for Pfizer series) 02/28/2020   INFLUENZA VACCINE  09/21/2020    There are no preventive care reminders to display for this patient.  No results found for: TSH Lab  Results  Component Value Date   WBC 6.6 08/25/2020   HGB 12.6 08/25/2020   HCT 38.3 08/25/2020   MCV 90.3 08/25/2020   PLT 268 08/25/2020   Lab Results  Component Value Date   NA 139 08/25/2020   K 4.3 08/25/2020   CO2 29 08/25/2020   GLUCOSE 80 08/25/2020   BUN 13 08/25/2020   CREATININE 0.87  08/25/2020   BILITOT 0.4 08/25/2020   AST 14 08/25/2020   ALT 8 08/25/2020   PROT 6.5 08/25/2020   CALCIUM 9.6 08/25/2020   No results found for: CHOL No results found for: HDL No results found for: LDLCALC No results found for: TRIG No results found for: CHOLHDL No results found for: RCVE9F    Assessment & Plan:   Problem List Items Addressed This Visit   None   No orders of the defined types were placed in this encounter. Age-appropriate screening and counseling performed today. Preventive measures discussed and printed in AVS for patient. She is doing very well and has no concerns today. Will check labs and lipid panel next year, as she just had labs done in July.   Follow-up: No follow-ups on file.    Samari Gorby M Hero Kulish, PA-C

## 2020-10-19 NOTE — Patient Instructions (Signed)
Good to see you again! Keep up the good work taking care of yourself. Call if any concerns.

## 2020-10-22 DIAGNOSIS — F4320 Adjustment disorder, unspecified: Secondary | ICD-10-CM | POA: Diagnosis not present

## 2020-10-27 DIAGNOSIS — F4320 Adjustment disorder, unspecified: Secondary | ICD-10-CM | POA: Diagnosis not present

## 2020-10-28 DIAGNOSIS — F330 Major depressive disorder, recurrent, mild: Secondary | ICD-10-CM | POA: Diagnosis not present

## 2020-10-28 DIAGNOSIS — F418 Other specified anxiety disorders: Secondary | ICD-10-CM | POA: Diagnosis not present

## 2020-10-28 DIAGNOSIS — F900 Attention-deficit hyperactivity disorder, predominantly inattentive type: Secondary | ICD-10-CM | POA: Diagnosis not present

## 2020-10-29 ENCOUNTER — Other Ambulatory Visit (HOSPITAL_COMMUNITY): Payer: Self-pay

## 2020-10-29 MED ORDER — TRAZODONE HCL 100 MG PO TABS
ORAL_TABLET | ORAL | 0 refills | Status: DC
  Filled 2020-10-29: qty 90, 90d supply, fill #0

## 2020-10-29 MED ORDER — FLUOXETINE HCL 40 MG PO CAPS
ORAL_CAPSULE | ORAL | 0 refills | Status: DC
  Filled 2020-10-29: qty 90, 90d supply, fill #0

## 2020-11-02 ENCOUNTER — Encounter: Payer: Self-pay | Admitting: Obstetrics and Gynecology

## 2020-11-02 ENCOUNTER — Other Ambulatory Visit: Payer: Self-pay

## 2020-11-02 ENCOUNTER — Other Ambulatory Visit (HOSPITAL_COMMUNITY)
Admission: RE | Admit: 2020-11-02 | Discharge: 2020-11-02 | Disposition: A | Discharge status: Home / Self Care | Payer: 59 | Source: Ambulatory Visit | Attending: Obstetrics and Gynecology | Admitting: Obstetrics and Gynecology

## 2020-11-02 ENCOUNTER — Ambulatory Visit: Payer: 59 | Admitting: Obstetrics and Gynecology

## 2020-11-02 VITALS — BP 92/61 | HR 74 | Resp 16 | Ht 66.0 in | Wt 133.0 lb

## 2020-11-02 DIAGNOSIS — N898 Other specified noninflammatory disorders of vagina: Secondary | ICD-10-CM | POA: Diagnosis not present

## 2020-11-02 LAB — POCT URINALYSIS DIPSTICK
Bilirubin, UA: NEGATIVE
Blood, UA: NEGATIVE
Glucose, UA: NEGATIVE
Ketones, UA: NEGATIVE
Leukocytes, UA: NEGATIVE
Nitrite, UA: NEGATIVE
Protein, UA: NEGATIVE
Spec Grav, UA: >=1.03 — AB (ref 1.010–1.025)
Urobilinogen, UA: NEGATIVE E.U./dL — AB
pH, UA: 7 (ref 5.0–8.0)

## 2020-11-02 NOTE — Progress Notes (Signed)
GYNECOLOGY OFFICE VISIT NOTE  History:   Debbie [name] is a 30 y.o. G0P0000 here today for vaginal irritation. It has been going on for several months. Worse after intercourse - sometimes 1-2 days later. Had a yeast infection proven in 09/14/20 but other tests negative. Sensation was burning. Discomfort was sometimes unprovoked. She denies any abnormal vaginal discharge, bleeding, pelvic pain or other concerns.     Past Medical History:  Diagnosis Date   Anxiety    Depression    Lupus (HCC)    Sjogren's syndrome (HCC)     History reviewed. No pertinent surgical history.  The following portions of the patient's history were reviewed and updated as appropriate: allergies, current medications, past family history, past medical history, past social history, past surgical history and problem list.   Health Maintenance:  Normal pap and negative HRHPV on 07/19/20.    Review of Systems:  Pertinent items noted in HPI and remainder of comprehensive ROS otherwise negative.  Physical Exam:  BP 92/61   Pulse 74   Resp 16   Ht 5\' 6"  (1.676 m)   Wt 133 lb (60.3 kg)   BMI 21.47 kg/m  CONSTITUTIONAL: Well-developed, well-nourished female in no acute distress.  HEENT:  Normocephalic, atraumatic. External right and left ear normal. No scleral icterus.  NECK: Normal range of motion, supple, no masses noted on observation SKIN: No rash noted. Not diaphoretic. No erythema. No pallor. MUSCULOSKELETAL: Normal range of motion. No edema noted. NEUROLOGIC: Alert and oriented to person, place, and time. Normal muscle tone coordination. No cranial nerve deficit noted. PSYCHIATRIC: Normal mood and affect. Normal behavior. Normal judgment and thought content.  ABDOMEN: No masses noted. No other overt distention noted.    PELVIC: Normal appearing external genitalia; normal urethral meatus; normal appearing vaginal mucosa. Normal qtip test.  No abnormal discharge noted.  Normal uterine size, no other  palpable masses, no uterine or adnexal tenderness. No levator spasm, no bladder tenderness Performed in the presence of a chaperone  Labs and Imaging Results for orders placed or performed in visit on 11/02/20 (from the past 168 hour(s))  POCT Urinalysis Dipstick   Collection Time: 11/02/20  8:24 AM  Result Value Ref Range   Color, UA amber    Clarity, UA clear    Glucose, UA Negative Negative   Bilirubin, UA neg    Ketones, UA neg    Spec Grav, UA >=1.030 (A) 1.010 - 1.025   Blood, UA neg    pH, UA 7.0 5.0 - 8.0   Protein, UA Negative Negative   Urobilinogen, UA negative (A) 0.2 or 1.0 E.U./dL   Nitrite, UA neg    Leukocytes, UA Negative Negative   Appearance amber    Odor none    No results found.    Assessment and Plan:    1. Vaginal irritation - Exam is negative for vulvodynia, levator spasm, IC - Discussed since not currently symptomatic except negative cultures - Discussed other measures I.e. boric acid supp. (Precautions reviewed), vinegar baths, natural lubricants. Discussed things to avoid.  - Reviewed may be helpful to use condoms with intercourse  - Cervicovaginal ancillary only( [place]) - POCT Urinalysis Dipstick - Urine Culture   Routine preventative health maintenance measures emphasized. Please refer to After Visit Summary for other counseling recommendations.   Return in about 1 year (around 11/02/2021) for annual.    I spent  22  minutes dedicated to the care of this patient including pre-visit review of  records, face to face time with the patient discussing her conditions and treatments and post visit orders.    Milas Hock, MD, FACOG Obstetrician & Gynecologist, [facility] for [facility], [facility]

## 2020-11-03 LAB — CERVICOVAGINAL ANCILLARY ONLY
Bacterial Vaginitis (gardnerella): NEGATIVE
Candida Glabrata: NEGATIVE
Candida Vaginitis: NEGATIVE
Comment: NEGATIVE
Comment: NEGATIVE
Comment: NEGATIVE

## 2020-11-04 ENCOUNTER — Other Ambulatory Visit (HOSPITAL_COMMUNITY): Payer: Self-pay

## 2020-11-04 LAB — URINE CULTURE
MICRO NUMBER:: 12363945
SPECIMEN QUALITY:: ADEQUATE

## 2020-11-04 MED ORDER — AMOXICILLIN 500 MG PO CAPS
500.0000 mg | ORAL_CAPSULE | Freq: Three times a day (TID) | ORAL | 0 refills | Status: AC
  Filled 2020-11-04: qty 21, 7d supply, fill #0

## 2020-11-04 NOTE — Addendum Note (Signed)
Addended by: Milas Hock A on: 11/04/2020 11:17 AM   Modules accepted: Orders

## 2020-11-12 ENCOUNTER — Other Ambulatory Visit (HOSPITAL_COMMUNITY): Payer: Self-pay

## 2020-11-12 DIAGNOSIS — F4320 Adjustment disorder, unspecified: Secondary | ICD-10-CM | POA: Diagnosis not present

## 2020-11-19 DIAGNOSIS — F4320 Adjustment disorder, unspecified: Secondary | ICD-10-CM | POA: Diagnosis not present

## 2020-11-23 ENCOUNTER — Other Ambulatory Visit (HOSPITAL_COMMUNITY): Payer: Self-pay

## 2020-11-27 ENCOUNTER — Other Ambulatory Visit (HOSPITAL_COMMUNITY): Payer: Self-pay

## 2020-12-12 NOTE — Progress Notes (Signed)
Established Patient Office Visit  Subjective:  Patient ID: Debbie [name], female    DOB: 24-Oct-1990  Age: 30 y.o. MRN: [id number]  CC:  Chief Complaint  Patient presents with   Follow-up    Patient states she is following up because she had and yeast infection went to the urgent care and was given an antibiotic and now has brooke out into hives all over and still a yeast infection.    HPI Debbie [name] presents for medication reaction  Had vaginal dc, went to urgent care, dx as candidal infection. Given diflucan.  Unfortunately dc recurred.  Now having hives across body. Red and itching. On and off. No shob, doe, chest pain, or involvment of mucus membranes Has not had this reaction previously.  Past Medical History:  Diagnosis Date   Anxiety    Depression    Lupus (HCC)    Sjogren's syndrome (HCC)     No past surgical history on file.  Family History  Problem Relation Age of Onset   Thyroid disease Mother    Cancer Maternal Grandmother    Cancer Paternal Grandfather    Hashimoto's thyroiditis Sister    Thyroid disease Sister    Thyroid disease Brother     Social History   Socioeconomic History   Marital status: Married    Spouse name: Not on file   Number of children: Not on file   Years of education: Not on file   Highest education level: Not on file  Occupational History   Not on file  Tobacco Use   Smoking status: Some Days    Types: Cigarettes   Smokeless tobacco: Never   Tobacco comments:    Very rarely, social smoker  Vaping Use   Vaping Use: Some days  Substance and Sexual Activity   Alcohol use: Yes    Comment: Rarely   Drug use: Yes    Types: Marijuana   Sexual activity: Not on file  Other Topics Concern   Not on file  Social History Narrative   Not on file   Social Determinants of Health   Financial Resource Strain: Not on file  Food Insecurity: Not on file  Transportation Needs: Not on file  Physical Activity: Not on  file  Stress: Not on file  Social Connections: Not on file  Intimate Partner Violence: Not on file    Outpatient Medications Prior to Visit  Medication Sig Dispense Refill   cholecalciferol (VITAMIN D3) 25 MCG (1000 UNIT) tablet Take 1,000 Units by mouth daily.     FLUoxetine (PROZAC) 40 MG capsule Take 1 capsule by mouth daily 30 capsule 0   hydrOXYzine (ATARAX/VISTARIL) 25 MG tablet TAKE 1 TABLET BY MOUTH TWICE DAILY AS NEEDED 60 tablet 0   spironolactone (ALDACTONE) 25 MG tablet Take 1 tablet (25 mg total) by mouth 2 (two) times daily. 180 tablet 0   hydroxychloroquine (PLAQUENIL) 200 MG tablet Take 1 tablet (200 mg total) by mouth daily. 60 tablet 0   traZODone (DESYREL) 100 MG tablet Take 1 tablet by mouth at bedtime 30 tablet 0   FLUoxetine (PROZAC) 20 MG capsule Take 20 mg by mouth daily. (Patient not taking: No sig reported)     Multiple Vitamin (MULTIVITAMIN) tablet Take 1 tablet by mouth daily. (Patient not taking: No sig reported)     predniSONE (DELTASONE) 10 MG tablet Take 3 tablets (30 mg total) by mouth daily with breakfast. (Patient not taking: No sig reported) 15 tablet 0  traZODone (DESYREL) 50 MG tablet Take 50 mg by mouth at bedtime. (Patient not taking: No sig reported)     No facility-administered medications prior to visit.    Allergies  Allergen Reactions   Sulfamethoxazole-Trimethoprim Rash and Swelling    ROS Review of Systems Per hpi    Objective:    Physical Exam Vitals and nursing note reviewed.  Constitutional:      General: She is not in acute distress.    Appearance: Normal appearance. She is normal weight. She is not ill-appearing, toxic-appearing or diaphoretic.  Cardiovascular:     Rate and Rhythm: Normal rate and regular rhythm.     Heart sounds: Normal heart sounds. No murmur heard.   No friction rub. No gallop.  Pulmonary:     Effort: Pulmonary effort is normal. No respiratory distress.     Breath sounds: Normal breath sounds. No  stridor. No wheezing, rhonchi or rales.  Chest:     Chest wall: No tenderness.  Skin:    General: Skin is warm and dry.  Neurological:     General: No focal deficit present.     Mental Status: She is alert and oriented to person, place, and time. Mental status is at baseline.  Psychiatric:        Mood and Affect: Mood normal.        Behavior: Behavior normal.        Thought Content: Thought content normal.        Judgment: Judgment normal.    BP 102/68   Pulse 76   Temp 98.4 F (36.9 C) (Temporal)   Resp 18   Ht 5\' 6"  (1.676 m)   Wt 131 lb 6.4 oz (59.6 kg)   SpO2 99%   BMI 21.21 kg/m  Wt Readings from Last 3 Encounters:  11/02/20 133 lb (60.3 kg)  10/19/20 134 lb 6.4 oz (61 kg)  10/06/20 135 lb 3.2 oz (61.3 kg)     Health Maintenance Due  Topic Date Due   COVID-19 Vaccine (4 - Booster for Pfizer series) 01/23/2020   INFLUENZA VACCINE  09/21/2020    There are no preventive care reminders to display for this patient.  No results found for: TSH Lab Results  Component Value Date   WBC 6.6 08/25/2020   HGB 12.6 08/25/2020   HCT 38.3 08/25/2020   MCV 90.3 08/25/2020   PLT 268 08/25/2020   Lab Results  Component Value Date   NA 139 08/25/2020   K 4.3 08/25/2020   CO2 29 08/25/2020   GLUCOSE 80 08/25/2020   BUN 13 08/25/2020   CREATININE 0.87 08/25/2020   BILITOT 0.4 08/25/2020   AST 14 08/25/2020   ALT 8 08/25/2020   PROT 6.5 08/25/2020   CALCIUM 9.6 08/25/2020   No results found for: CHOL No results found for: HDL No results found for: LDLCALC No results found for: TRIG No results found for: CHOLHDL No results found for: 10/26/2020    Assessment & Plan:   Problem List Items Addressed This Visit   None Visit Diagnoses     Yeast infection    -  Primary   Relevant Orders   Urine cytology ancillary only([place]) (Completed)   Rash and nonspecific skin eruption       Dysuria           Meds ordered this encounter  Medications   predniSONE  (DELTASONE) 10 MG tablet    Sig: Take 4 tablets (40 mg total) by mouth  daily with breakfast for 3 days, THEN 2 tablets (20 mg total) daily with breakfast for 3 days, THEN 1 tablet (10 mg total) daily with breakfast for 3 days.    Dispense:  21 tablet    Refill:  0    Order Specific Question:   Supervising Provider    Answer:   Neva Seat, JEFFREY R [[id number]]    Follow-up: No follow-ups on file.   PLAN Cytology ordered for persistent infection Prednisone taper as above for medication reaction Close follow up with myself or PCP for persistent symptoms Patient encouraged to call clinic with any questions, comments, or concerns.  Janeece Agee, NP

## 2020-12-29 ENCOUNTER — Other Ambulatory Visit (HOSPITAL_COMMUNITY): Payer: Self-pay

## 2020-12-31 ENCOUNTER — Other Ambulatory Visit (HOSPITAL_COMMUNITY): Payer: Self-pay

## 2020-12-31 MED ORDER — FLUOXETINE HCL 40 MG PO CAPS
ORAL_CAPSULE | ORAL | 0 refills | Status: DC
  Filled 2020-12-31: qty 90, 90d supply, fill #0

## 2021-01-20 ENCOUNTER — Other Ambulatory Visit (HOSPITAL_COMMUNITY): Payer: Self-pay

## 2021-01-20 DIAGNOSIS — F330 Major depressive disorder, recurrent, mild: Secondary | ICD-10-CM | POA: Diagnosis not present

## 2021-01-20 DIAGNOSIS — F418 Other specified anxiety disorders: Secondary | ICD-10-CM | POA: Diagnosis not present

## 2021-01-20 DIAGNOSIS — F900 Attention-deficit hyperactivity disorder, predominantly inattentive type: Secondary | ICD-10-CM | POA: Diagnosis not present

## 2021-01-20 MED ORDER — TRAZODONE HCL 100 MG PO TABS
100.0000 mg | ORAL_TABLET | Freq: Every evening | ORAL | 0 refills | Status: DC
  Filled 2021-01-20: qty 90, 90d supply, fill #0

## 2021-01-21 ENCOUNTER — Other Ambulatory Visit (HOSPITAL_COMMUNITY): Payer: Self-pay

## 2021-02-15 DIAGNOSIS — Z20822 Contact with and (suspected) exposure to covid-19: Secondary | ICD-10-CM | POA: Diagnosis not present

## 2021-02-22 NOTE — Progress Notes (Signed)
Office Visit Note  Patient: Debbie [name]             Date of Birth: 1990/07/27           MRN: [id number]             PCP: Bary Leriche, PA-C Referring: Allwardt, Crist Infante, PA-C Visit Date: 02/23/2021   Subjective:  Follow-up (Doing very good)   History of Present Illness: Debbie [name] is a 31 y.o. female here for follow up for SLE vs sjogren's syndrome on HCQ 300 mg PO daily. She has no problems with the plaquenil since last visit. Eye exam was unremarkable during the interval. She took antibiotics for yeast infection and UTI which she reports has always been very common for her. Her dry eyes and mouth remain about the same, using OTC drops as needed for this. She continues having some hand pain and stiffness without any swelling or discoloration. Skin has been better, remaining pretty much clear since around May.  Previous HPI 08/25/20 Debbie [name] is a 31 y.o. female here for follow up for probable SLE vs sjogren's syndrome here after starting hydroxychloroquine about 10 weeks ago.  She feels that joint pain and stiffness symptoms are somewhat improved after starting the medicine but she has noticed no improvement to dry eyes and mouth that are the same or possibly worse.  She saw Dr. Vanessa Barbara for baseline eye exam that looked okay.  She is using over-the-counter refresh type lubricating tears has been using mouth lozenges and wash at dentist directions.   Previous HPI: 06/10/20 Debbie [name] is a 31 y.o. female here for evaluation of Sjogren's syndrome.  Symptoms originally began in 2016 with skin rash that was suspected to be cutaneous lupus work-up showing positive ANA and SSA antibodies she never started any specific treatment for this problem.  She has subsequent follow-up in 2019 with [facility] rheumatology that redemonstrated positive test at higher titer of SSA antibodies than before.  Her skin inflammation is less significant than before. Currently she has  new or problems of severe fatigue that is persistent does not improve with rest and bilateral hand pain not associated with a significant amount of swelling redness or warmth.   She has experienced multiple episodes of sharp chest pains while taking deep breaths lasting for up to a few days and these happen every 1 to 3 months.  She has not noticed any positional change in symptoms during these episodes.  She has some eye and mouth dryness but does not routinely use any treatment for this.  She has intermittently ulcers most, located on the gums and buccal surfaces of the mouth.  She experiences pallor and paresthesias in her hands with cold exposure. However she also has hyperhidrosis and frequently a moderate degree of erythema in her hands and feet associated with this.  She denies any ulcers pitting or lesions of fingertips and nails.  She denies any alopecia, lymphadenopathy, or history of blood clots.   She currently uses IUD for contraception with no short-term plans for becoming pregnant.   Review of Systems  Constitutional:  Positive for fatigue.  HENT:  Positive for mouth dryness.   Eyes:  Positive for dryness.  Respiratory:  Negative for shortness of breath.   Cardiovascular:  Negative for swelling in legs/feet.  Gastrointestinal:  Negative for constipation.  Endocrine: Positive for cold intolerance, heat intolerance and excessive thirst.  Genitourinary:  Negative for difficulty urinating.  Musculoskeletal:  Positive for joint pain and joint pain.  Skin:  Negative for rash.  Allergic/Immunologic: Positive for susceptible to infections.  Neurological:  Negative for numbness.  Hematological:  Negative for bruising/bleeding tendency.  Psychiatric/Behavioral:  Positive for sleep disturbance.    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 No pertinent surgical history. Social History   Social History Narrative   Not on file   Immunization History  Administered Date(s) Administered   Influenza,inj,Quad PF,6-35 Mos 11/27/2017, 12/04/2018   PFIZER(Purple Top)SARS-COV-2 Vaccination 03/25/2019, 04/22/2019, 11/28/2019   Tdap 01/01/2019     Objective: Vital Signs: BP (!) 92/57 (BP Location: Left Arm, Patient Position: Sitting, Cuff Size: Normal)    Pulse 73    Resp 14    Ht 5\' 6"  (1.676 m)    Wt 138 lb (62.6 kg)    BMI 22.27 kg/m    Physical Exam HENT:     Right Ear: External ear normal.     Left Ear: External ear normal.     Mouth/Throat:     Mouth: Mucous membranes are moist.     Pharynx: Oropharynx is clear.  Eyes:     Conjunctiva/sclera: Conjunctivae normal.  Cardiovascular:     Rate and Rhythm: Normal rate and regular rhythm.  Pulmonary:     Effort: Pulmonary effort is normal.     Breath sounds: Normal breath sounds.  Musculoskeletal:        General: Normal range of motion.     Right lower leg: No edema.     Left lower leg: No edema.  Skin:    General: Skin is warm and dry.     Comments: Hyperhydrosis bilateral hands  Neurological:     Mental Status: She is alert.  Psychiatric:        Mood and Affect: Mood normal.     Musculoskeletal Exam:  Neck full ROM no tenderness Shoulders full ROM no tenderness or swelling Elbows full ROM no tenderness or swelling Wrists full ROM no tenderness or swelling Fingers full ROM no tenderness or swelling Knees full ROM no tenderness or swelling Ankles full ROM no tenderness or swelling   Investigation: No additional  findings.  Imaging: No results found.  Recent Labs: Lab Results  Component Value Date   WBC 6.6 08/25/2020   HGB 12.6 08/25/2020   PLT 268 08/25/2020   NA 139 08/25/2020   K 4.3 08/25/2020   CL 102 08/25/2020   CO2 29 08/25/2020   GLUCOSE 80 08/25/2020   BUN 13 08/25/2020   CREATININE 0.87 08/25/2020   BILITOT 0.4 08/25/2020   AST 14 08/25/2020   ALT 8 08/25/2020   PROT 6.5 08/25/2020   CALCIUM 9.6 08/25/2020   GFRAA 106 08/25/2020    Speciality Comments: PLQ Eye Exam- Dr. Coralyn Pear, 07/29/2020 Per office note: no retinal toxicity noted on exam or OCT today, f/u 1 year  Procedures:  No procedures performed Allergies: Latex and Sulfamethoxazole-trimethoprim  Assessment / Plan:     Visit Diagnoses: Sjogren's syndrome with keratoconjunctivitis sicca ([place]) - Plan: CBC with Differential/Platelet, C3 and C4, Sedimentation rate  Skin symptoms are significantly improved, eye dryness and joint pains without much difference. No intolerance and eye exam was normal. Checking CBC, complement, and sed rate for medication monitoring and rechecking any evidence for systemic inflammation. Recommend continuing HCQ 300 mg daily. Can f/u in 1 yr unless new or worsening symptoms.  Orders: Orders Placed This Encounter  Procedures   CBC with Differential/Platelet   C3 and C4   Sedimentation rate   No orders of the defined types were placed in this encounter.    Follow-Up Instructions: Return in about 1 year (around 02/23/2022) for pSS on HCQ f/u 47yr.   Collier Salina, MD  Note - This record has been created using [employer].  Chart creation errors have been sought, but may not always  have been located. Such creation errors do not reflect on  the standard of medical care.

## 2021-02-23 ENCOUNTER — Other Ambulatory Visit: Payer: Self-pay

## 2021-02-23 ENCOUNTER — Other Ambulatory Visit (HOSPITAL_COMMUNITY): Payer: Self-pay

## 2021-02-23 ENCOUNTER — Ambulatory Visit: Payer: 59 | Admitting: Internal Medicine

## 2021-02-23 ENCOUNTER — Encounter: Payer: Self-pay | Admitting: Internal Medicine

## 2021-02-23 VITALS — BP 92/57 | HR 73 | Resp 14 | Ht 66.0 in | Wt 138.0 lb

## 2021-02-23 DIAGNOSIS — L930 Discoid lupus erythematosus: Secondary | ICD-10-CM | POA: Diagnosis not present

## 2021-02-23 DIAGNOSIS — M3501 Sicca syndrome with keratoconjunctivitis: Secondary | ICD-10-CM | POA: Diagnosis not present

## 2021-02-23 MED ORDER — HYDROXYCHLOROQUINE SULFATE 200 MG PO TABS
300.0000 mg | ORAL_TABLET | Freq: Every day | ORAL | 3 refills | Status: DC
  Filled 2021-02-23: qty 135, 90d supply, fill #0

## 2021-02-24 LAB — CBC WITH DIFFERENTIAL/PLATELET
Absolute Monocytes: 462 cells/uL (ref 200–950)
Basophils Absolute: 28 cells/uL (ref 0–200)
Basophils Relative: 0.4 %
Eosinophils Absolute: 107 cells/uL (ref 15–500)
Eosinophils Relative: 1.5 %
HCT: 38.6 % (ref 35.0–45.0)
Hemoglobin: 12.9 g/dL (ref 11.7–15.5)
Lymphs Abs: 1633 cells/uL (ref 850–3900)
MCH: 30.1 pg (ref 27.0–33.0)
MCHC: 33.4 g/dL (ref 32.0–36.0)
MCV: 90 fL (ref 80.0–100.0)
MPV: 9.8 fL (ref 7.5–12.5)
Monocytes Relative: 6.5 %
Neutro Abs: 4871 cells/uL (ref 1500–7800)
Neutrophils Relative %: 68.6 %
Platelets: 303 10*3/uL (ref 140–400)
RBC: 4.29 10*6/uL (ref 3.80–5.10)
RDW: 11.5 % (ref 11.0–15.0)
Total Lymphocyte: 23 %
WBC: 7.1 10*3/uL (ref 3.8–10.8)

## 2021-02-24 LAB — SEDIMENTATION RATE: Sed Rate: 11 mm/h (ref 0–20)

## 2021-02-24 LAB — C3 AND C4
C3 Complement: 137 mg/dL (ref 83–193)
C4 Complement: 33 mg/dL (ref 15–57)

## 2021-02-25 ENCOUNTER — Ambulatory Visit: Payer: 59 | Admitting: Physician Assistant

## 2021-02-25 ENCOUNTER — Other Ambulatory Visit: Payer: Self-pay

## 2021-02-25 ENCOUNTER — Encounter: Payer: Self-pay | Admitting: Physician Assistant

## 2021-02-25 DIAGNOSIS — L719 Rosacea, unspecified: Secondary | ICD-10-CM

## 2021-02-25 MED ORDER — RHOFADE 1 % EX CREA
1.0000 | TOPICAL_CREAM | Freq: Every day | CUTANEOUS | 2 refills | Status: DC
Start: 2021-02-25 — End: 2021-04-23

## 2021-02-26 NOTE — Progress Notes (Signed)
Lab results all look fine. She can continue the hydroxychloroquine as planned. She can contact us if any new or worsening problems arise.

## 2021-03-01 ENCOUNTER — Encounter: Payer: Self-pay | Admitting: Physician Assistant

## 2021-03-01 NOTE — Progress Notes (Signed)
° °  New Patient   Subjective  Debbie [name] is a 31 y.o. female who presents for the following: Rosacea (Face- ? Rosacea x year- gets red & pimples- comes and goes). She does experience flushing when she drinks wine.    The following portions of the chart were reviewed this encounter and updated as appropriate:  Tobacco   Allergies   Meds   Problems   Med Hx   Surg Hx   Fam Hx       Objective  Well appearing patient in no apparent distress; mood and affect are within normal limits.  A focused examination was performed including face. Relevant physical exam findings are noted in the Assessment and Plan.  central face and cheeks. Centrifacial erythema without papules/pustules today.    Assessment & Plan  Rosacea central face and cheeks.  Oxymetazoline HCl (RHOFADE) 1 % CREA - central face and cheeks. Apply 1 application topically daily.     I, Shanie Mauzy, PA-C, have reviewed all documentation's for this visit.  The documentation on 03/01/21 for the exam, diagnosis, procedures and orders are all accurate and complete.

## 2021-03-03 ENCOUNTER — Other Ambulatory Visit (HOSPITAL_COMMUNITY): Payer: Self-pay

## 2021-04-16 ENCOUNTER — Other Ambulatory Visit (HOSPITAL_COMMUNITY): Payer: Self-pay

## 2021-04-16 DIAGNOSIS — F900 Attention-deficit hyperactivity disorder, predominantly inattentive type: Secondary | ICD-10-CM | POA: Diagnosis not present

## 2021-04-16 DIAGNOSIS — F418 Other specified anxiety disorders: Secondary | ICD-10-CM | POA: Diagnosis not present

## 2021-04-16 DIAGNOSIS — F330 Major depressive disorder, recurrent, mild: Secondary | ICD-10-CM | POA: Diagnosis not present

## 2021-04-16 MED ORDER — TRAZODONE HCL 100 MG PO TABS
ORAL_TABLET | ORAL | 0 refills | Status: DC
  Filled 2021-04-16: qty 90, 90d supply, fill #0

## 2021-04-16 MED ORDER — FLUOXETINE HCL 40 MG PO CAPS
ORAL_CAPSULE | ORAL | 0 refills | Status: DC
  Filled 2021-04-16: qty 90, 90d supply, fill #0

## 2021-04-23 ENCOUNTER — Encounter: Payer: Self-pay | Admitting: Physician Assistant

## 2021-04-23 ENCOUNTER — Other Ambulatory Visit: Payer: Self-pay

## 2021-04-23 ENCOUNTER — Ambulatory Visit (INDEPENDENT_AMBULATORY_CARE_PROVIDER_SITE_OTHER)
Admission: RE | Admit: 2021-04-23 | Discharge: 2021-04-23 | Disposition: A | Discharge status: Home / Self Care | Payer: 59 | Source: Ambulatory Visit | Attending: Physician Assistant | Admitting: Physician Assistant

## 2021-04-23 ENCOUNTER — Other Ambulatory Visit (HOSPITAL_COMMUNITY): Payer: Self-pay

## 2021-04-23 ENCOUNTER — Other Ambulatory Visit: Payer: Self-pay | Admitting: Physician Assistant

## 2021-04-23 ENCOUNTER — Ambulatory Visit: Payer: 59 | Admitting: Physician Assistant

## 2021-04-23 VITALS — BP 96/67 | HR 73 | Temp 97.8°F | Ht 66.0 in | Wt 137.2 lb

## 2021-04-23 DIAGNOSIS — R079 Chest pain, unspecified: Secondary | ICD-10-CM | POA: Diagnosis not present

## 2021-04-23 DIAGNOSIS — R0789 Other chest pain: Secondary | ICD-10-CM

## 2021-04-23 DIAGNOSIS — R0602 Shortness of breath: Secondary | ICD-10-CM | POA: Diagnosis not present

## 2021-04-23 MED ORDER — SPIRONOLACTONE 25 MG PO TABS
25.0000 mg | ORAL_TABLET | Freq: Two times a day (BID) | ORAL | 0 refills | Status: DC
  Filled 2021-04-23: qty 180, 90d supply, fill #0

## 2021-04-23 NOTE — Progress Notes (Signed)
? ?Subjective:  ? ? Patient ID: Vassie Moselle, female    DOB: 1990/03/01, 31 y.o.   MRN: [id number] ? ?Chief Complaint  ?Patient presents with  ? Chest Pain  ? Shortness of Breath  ? ? ?HPI ?Patient is in today for new complaints of CP and SOB x 6 days. ? ?Having to focus on her breathing this week, not feeling like getting a full breath. Today feels better. No hx of any breathing issues. ?Symptoms came on gradually. ?Yawning a lot to help air get in. ?No sick symptoms. No congestion or allergy symptoms. ? ?Two days ago - had left sided chest pain for 30 minutes, throbbing pain. Works at [employer] and doctors checked her and said she was doing ok. Resolved on its own.  ? ?Discontinued Prozac about 3 months ago. D/C'd plaquenil in January b/c of potential side effect risk.  ? ?Works in [employer] and has been feeling more stressed lately.  ? ?Past Medical History:  ?Diagnosis Date  ? Anxiety   ? Depression   ? Lupus ([place])   ? Sjogren's syndrome ([place])   ? ? ?History reviewed. No pertinent surgical history. ? ?Family History  ?Problem Relation Age of Onset  ? Thyroid disease Mother   ? Cancer Maternal Grandmother   ? Cancer Paternal Grandfather   ? Hashimoto's thyroiditis Sister   ? Thyroid disease Sister   ? Thyroid disease Brother   ? ? ?Social History  ? ?Tobacco Use  ? Smoking status: Some Days  ?  Types: Cigarettes  ? Smokeless tobacco: Never  ? Tobacco comments:  ?  Very rarely, social smoker  ?Vaping Use  ? Vaping Use: Some days  ?Substance Use Topics  ? Alcohol use: Yes  ?  Comment: Rarely  ? Drug use: Yes  ?  Types: Marijuana  ?  ? ?Allergies  ?Allergen Reactions  ? Latex Rash  ? Sulfamethoxazole-Trimethoprim Rash and Swelling  ? ? ?Review of Systems ?NEGATIVE UNLESS OTHERWISE INDICATED IN HPI ? ? ?   ?Objective:  ?  ? ?BP 96/67   Pulse 73   Temp 97.8 ?F (36.6 ?C)   Ht 5\' 6"  (1.676 m)   Wt 137 lb 3.2 oz (62.2 kg)   SpO2 100%   BMI 22.14 kg/m?  ? ?Wt Readings from Last 3  Encounters:  ?04/23/21 137 lb 3.2 oz (62.2 kg)  ?02/23/21 138 lb (62.6 kg)  ?11/02/20 133 lb (60.3 kg)  ? ? ?BP Readings from Last 3 Encounters:  ?04/23/21 96/67  ?02/23/21 (!) 92/57  ?11/02/20 92/61  ?  ? ?Physical Exam ?Vitals and nursing note reviewed.  ?Constitutional:   ?   Appearance: Normal appearance. She is normal weight. She is not toxic-appearing.  ?HENT:  ?   Head: Normocephalic and atraumatic.  ?   Right Ear: Tympanic membrane, ear canal and external ear normal.  ?   Left Ear: Tympanic membrane, ear canal and external ear normal.  ?   Nose: Nose normal.  ?   Mouth/Throat:  ?   Mouth: Mucous membranes are moist.  ?Eyes:  ?   Extraocular Movements: Extraocular movements intact.  ?   Conjunctiva/sclera: Conjunctivae normal.  ?   Pupils: Pupils are equal, round, and reactive to light.  ?Cardiovascular:  ?   Rate and Rhythm: Normal rate and regular rhythm.  ?   Pulses: Normal pulses.  ?   Heart sounds: Normal heart sounds.  ?Pulmonary:  ?   Effort: Pulmonary effort  is normal.  ?   Breath sounds: Normal breath sounds.  ?Abdominal:  ?   General: Abdomen is flat. Bowel sounds are normal.  ?   Palpations: Abdomen is soft.  ?Musculoskeletal:     ?   General: Normal range of motion.  ?   Cervical back: Normal range of motion and neck supple.  ?Skin: ?   General: Skin is warm and dry.  ?Neurological:  ?   General: No focal deficit present.  ?   Mental Status: She is alert and oriented to person, place, and time.  ?Psychiatric:     ?   Mood and Affect: Mood normal.     ?   Behavior: Behavior normal.     ?   Thought Content: Thought content normal.     ?   Judgment: Judgment normal.  ? ? ?   ?Assessment & Plan:  ? ?Problem List Items Addressed This Visit   ?None ?Visit Diagnoses   ? ? Other chest pain    -  Primary  ? Relevant Orders  ? EKG 12-Lead (Completed)  ? DG Chest 2 View  ? Shortness of breath      ? Relevant Orders  ? EKG 12-Lead (Completed)  ? DG Chest 2 View  ? ?  ? ? ?1. Other chest pain ?2. Shortness of  breath ?-No red flag symptoms in office today ?-EKG per my read is NSR, no ST or T wave changes, very reassuring ?-Will obtain outpatient CXR to ensure no other underlying etiology esp w/her hx of lupus ?-I do not feel like labs are necessary nor would they be helpful today. No risk for PE, no recent travel, HR and SpO2 normal. ?-Most likely stress related esp w/being off Prozac now as well. Talked about ways to manage stress. ? ?ER if any red flag symptoms.  ? ? ?Joanell Cressler M Makale Pindell, PA-C ?

## 2021-04-26 ENCOUNTER — Other Ambulatory Visit (HOSPITAL_COMMUNITY): Payer: Self-pay

## 2021-05-19 ENCOUNTER — Ambulatory Visit: Payer: 59 | Admitting: Physician Assistant

## 2021-05-19 ENCOUNTER — Encounter: Payer: Self-pay | Admitting: Physician Assistant

## 2021-05-19 ENCOUNTER — Other Ambulatory Visit (HOSPITAL_COMMUNITY): Payer: Self-pay

## 2021-05-19 VITALS — BP 98/66 | HR 67 | Temp 98.2°F | Ht 66.0 in | Wt 140.0 lb

## 2021-05-19 DIAGNOSIS — R8290 Unspecified abnormal findings in urine: Secondary | ICD-10-CM

## 2021-05-19 DIAGNOSIS — N898 Other specified noninflammatory disorders of vagina: Secondary | ICD-10-CM

## 2021-05-19 LAB — POCT URINALYSIS DIPSTICK
Bilirubin, UA: NEGATIVE
Blood, UA: NEGATIVE
Glucose, UA: NEGATIVE
Ketones, UA: NEGATIVE
Leukocytes, UA: NEGATIVE
Nitrite, UA: NEGATIVE
Protein, UA: NEGATIVE
Spec Grav, UA: 1.01 (ref 1.010–1.025)
Urobilinogen, UA: 0.2 E.U./dL
pH, UA: 6.5 (ref 5.0–8.0)

## 2021-05-19 MED ORDER — TRIAMCINOLONE ACETONIDE 0.5 % EX OINT
TOPICAL_OINTMENT | CUTANEOUS | 0 refills | Status: DC
  Filled 2021-05-19: qty 30, 14d supply, fill #0

## 2021-05-19 NOTE — Progress Notes (Signed)
? ?  Subjective:  ? ? Patient ID: Debbie [name], female    DOB: [birth year], 31 y.o.   MRN: [id number] ? ?Chief Complaint  ?Patient presents with  ? Vaginal Discharge  ? Anus Itch  ? ? ?HPI ?Patient is in today for vaginal and anal itching. Hx of SLE.  ?Symptoms started 3 days ago. ?Has tried topical yeast infection cream OTC, not helping. ?LMP unknown - has IUD present.  ? ?No vaginal discharge.  ? ?Festival over the weekend - not much water, hot, using port-a-potty. ? ?No dysuria. No frequency. Some cloudiness to urine.  ? ?No new partners. Not having any symptoms.  ? ?Past Medical History:  ?Diagnosis Date  ? Anxiety   ? Depression   ? Lupus ([place])   ? Sjogren's syndrome ([place])   ? ? ?History reviewed. No pertinent surgical history. ? ?Family History  ?Problem Relation Age of Onset  ? Thyroid disease Mother   ? Cancer Maternal Grandmother   ? Cancer Paternal Grandfather   ? Hashimoto's thyroiditis Sister   ? Thyroid disease Sister   ? Thyroid disease Brother   ? ? ?Social History  ? ?Tobacco Use  ? Smoking status: Some Days  ?  Types: Cigarettes  ? Smokeless tobacco: Never  ? Tobacco comments:  ?  Very rarely, social smoker  ?Vaping Use  ? Vaping Use: Some days  ?Substance Use Topics  ? Alcohol use: Yes  ?  Comment: Rarely  ? Drug use: Yes  ?  Types: Marijuana  ?  ? ?Allergies  ?Allergen Reactions  ? Latex Rash  ? Sulfamethoxazole-Trimethoprim Rash and Swelling  ? ? ?Review of Systems ?NEGATIVE UNLESS OTHERWISE INDICATED IN HPI ? ? ?   ?Objective:  ?  ? ?BP 98/66   Pulse 67   Temp 98.2 ?F (36.8 ?C)   Ht 5\' 6"  (1.676 m)   Wt 140 lb (63.5 kg)   SpO2 99%   BMI 22.60 kg/m?  ? ?Wt Readings from Last 3 Encounters:  ?05/19/21 140 lb (63.5 kg)  ?04/23/21 137 lb 3.2 oz (62.2 kg)  ?02/23/21 138 lb (62.6 kg)  ? ? ?BP Readings from Last 3 Encounters:  ?05/19/21 98/66  ?04/23/21 96/67  ?02/23/21 (!) 92/57  ?  ? ?Physical Exam ?Exam conducted with a chaperone present.  ?Genitourinary: ?   Comments: Vulvar dermatitis  present ?No lesions ?No discharge ? ? ?   ?Assessment & Plan:  ? ?Problem List Items Addressed This Visit   ?None ?Visit Diagnoses   ? ? Vaginal itching    -  Primary  ? Relevant Orders  ? POCT urinalysis dipstick (Completed)  ? Cloudy urine      ? Relevant Orders  ? POCT urinalysis dipstick (Completed)  ? ?  ? ? ? ?Meds ordered this encounter  ?Medications  ? triamcinolone ointment (KENALOG) 0.5 %  ?  Sig: Apply thin layer twice daily for no longer than 2 consistent weeks.  ?  Dispense:  30 g  ?  Refill:  0  ? ?1. Vaginal itching ?Mild vulvar dermatitis  ?Kenalog ointment as directed ?Recheck prn  ? ?2. Cloudy urine ?Reassured that UA is clear, no signs of infection  ? ? ?Wasim Hurlbut M Noah Pelaez, PA-C ?

## 2021-06-15 ENCOUNTER — Other Ambulatory Visit (HOSPITAL_COMMUNITY): Payer: Self-pay

## 2021-06-16 ENCOUNTER — Other Ambulatory Visit (HOSPITAL_COMMUNITY): Payer: Self-pay

## 2021-06-24 ENCOUNTER — Other Ambulatory Visit (HOSPITAL_COMMUNITY): Payer: Self-pay

## 2021-07-03 ENCOUNTER — Other Ambulatory Visit (HOSPITAL_COMMUNITY): Payer: Self-pay

## 2021-07-05 ENCOUNTER — Other Ambulatory Visit (HOSPITAL_COMMUNITY): Payer: Self-pay

## 2021-07-09 DIAGNOSIS — F330 Major depressive disorder, recurrent, mild: Secondary | ICD-10-CM | POA: Diagnosis not present

## 2021-07-09 DIAGNOSIS — F900 Attention-deficit hyperactivity disorder, predominantly inattentive type: Secondary | ICD-10-CM | POA: Diagnosis not present

## 2021-07-09 DIAGNOSIS — F418 Other specified anxiety disorders: Secondary | ICD-10-CM | POA: Diagnosis not present

## 2021-07-10 ENCOUNTER — Other Ambulatory Visit (HOSPITAL_COMMUNITY): Payer: Self-pay

## 2021-07-10 MED ORDER — FLUOXETINE HCL 40 MG PO CAPS
ORAL_CAPSULE | ORAL | 0 refills | Status: DC
  Filled 2021-07-10: qty 90, 90d supply, fill #0

## 2021-07-10 MED ORDER — TRAZODONE HCL 100 MG PO TABS
ORAL_TABLET | ORAL | 0 refills | Status: DC
  Filled 2021-07-10 – 2021-09-12 (×2): qty 90, 90d supply, fill #0

## 2021-07-15 ENCOUNTER — Other Ambulatory Visit (HOSPITAL_COMMUNITY): Payer: Self-pay

## 2021-07-15 NOTE — Progress Notes (Shared)
[facility] - Clinic Note  07/29/2021     CHIEF COMPLAINT Patient presents for No chief complaint on file.   HISTORY OF PRESENT ILLNESS: Debbie [name] is a 31 y.o. female who presents to the clinic today for:    pt is here on the referral of Dr. Dimple Casey for a baseline retinal exam for plaquenil use, pt states she was recently dx with lupus and also has Sjogrens, pt is taking 200 mg/day.  Referring physician: Sheliah Hatch MD [address] [place] [address]   HISTORICAL INFORMATION:   Selected notes from the MEDICAL RECORD NUMBER Referred by Dr. Sheliah Hatch for plaquenil exam LEE:  Ocular Hx- PMH- SLE, Sjogrens syndrome    CURRENT MEDICATIONS: No current outpatient medications on file. (Ophthalmic Drugs)   No current facility-administered medications for this visit. (Ophthalmic Drugs)   Current Outpatient Medications (Other)  Medication Sig   cholecalciferol (VITAMIN D3) 25 MCG (1000 UNIT) tablet Take 1,000 Units by mouth daily.   FLUoxetine (PROZAC) 40 MG capsule Take 1 capsule by mouth daily.   Levonorgestrel (LILETTA, 52 MG, IU) by Intrauterine route.   spironolactone (ALDACTONE) 25 MG tablet Take 1 tablet by mouth 2 times daily.   traZODone (DESYREL) 100 MG tablet Take 1 tablet by mouth at bedtime   traZODone (DESYREL) 100 MG tablet Take 1 tablet by mouth at bedtime.   triamcinolone ointment (KENALOG) 0.5 % Apply thin layer twice daily for no longer than 2 consistent weeks.   No current facility-administered medications for this visit. (Other)      REVIEW OF SYSTEMS:     ALLERGIES Allergies  Allergen Reactions   Latex Rash   Sulfamethoxazole-Trimethoprim Rash and Swelling    PAST MEDICAL HISTORY Past Medical History:  Diagnosis Date   Anxiety    Depression    Lupus ([facility])    Sjogren's syndrome ([facility])    No past surgical history on file.  FAMILY HISTORY Family History  Problem Relation Age of Onset    Thyroid disease Mother    Cancer Maternal Grandmother    Cancer Paternal Grandfather    Hashimoto's thyroiditis Sister    Thyroid disease Sister    Thyroid disease Brother     SOCIAL HISTORY Social History   Tobacco Use   Smoking status: Some Days    Types: Cigarettes   Smokeless tobacco: Never   Tobacco comments:    Very rarely, social smoker  Vaping Use   Vaping Use: Some days  Substance Use Topics   Alcohol use: Yes    Comment: Rarely   Drug use: Yes    Types: Marijuana         OPHTHALMIC EXAM:  Not recorded     IMAGING AND PROCEDURES  Imaging and Procedures for 07/29/2021            ASSESSMENT/PLAN:    ICD-10-CM   1. Lupus erythematosus, unspecified form  L93.0     2. 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 Plaquenil (hydroxychloroquine [HCQ]) use for SLE - normal baseline exam -- no retinal toxicity noted on exam or OCT today - pt taking 200 mg daily - pt reports wt is ~59 kg - 200/59 = 3.3 mg/kg/day - the AAO recommends daily dosing of < 5.0 mg/kg for HCQ -- pt in good range - will monitor - f/u 1 year, DFE, OCT  3. Lattice degeneration OD - mild lattice IT almost to ora - no RT/RD - discussed findings, prognosis, and treatment options including observation - recommend monitoring for now  Ophthalmic Meds Ordered this visit:  No orders of the defined types were placed in this encounter.      No follow-ups on file.  There are no Patient Instructions on file for this visit.   Explained the diagnoses, plan, and follow up with the patient and they expressed understanding.  Patient expressed understanding of the importance of proper follow up care.   This document serves as a record of services personally performed by Karie Chimera, MD, PhD. It was created on their behalf by Glee Arvin. Manson Passey, OA an ophthalmic technician. The creation of this record is the provider's dictation  and/or activities during the visit.    Electronically signed by: Glee Arvin. [place], [place] 05.25.2023 10:16 AM   Karie Chimera, M.D., Ph.D. Diseases & Surgery of the Retina and Vitreous [facility]   Abbreviations: M myopia (nearsighted); A astigmatism; H hyperopia (farsighted); P presbyopia; Mrx spectacle prescription;  CTL contact lenses; OD right eye; OS left eye; OU both eyes  XT exotropia; ET esotropia; PEK punctate epithelial keratitis; PEE punctate epithelial erosions; DES dry eye syndrome; MGD meibomian gland dysfunction; ATs artificial tears; PFAT's preservative free artificial tears; NSC nuclear sclerotic cataract; PSC posterior subcapsular cataract; ERM epi-retinal membrane; PVD posterior vitreous detachment; RD retinal detachment; DM diabetes mellitus; DR diabetic retinopathy; NPDR non-proliferative diabetic retinopathy; PDR proliferative diabetic retinopathy; CSME clinically significant macular edema; DME diabetic macular edema; dbh dot blot hemorrhages; CWS cotton wool spot; POAG primary open angle glaucoma; C/D cup-to-disc ratio; HVF humphrey visual field; GVF goldmann visual field; OCT optical coherence tomography; IOP intraocular pressure; BRVO Branch retinal vein occlusion; CRVO central retinal vein occlusion; CRAO central retinal artery occlusion; BRAO branch retinal artery occlusion; RT retinal tear; SB scleral buckle; PPV pars plana vitrectomy; VH Vitreous hemorrhage; PRP panretinal laser photocoagulation; IVK intravitreal kenalog; VMT vitreomacular traction; MH Macular hole;  NVD neovascularization of the disc; NVE neovascularization elsewhere; AREDS age related eye disease study; ARMD age related macular degeneration; POAG primary open angle glaucoma; EBMD epithelial/anterior basement membrane dystrophy; ACIOL anterior chamber intraocular lens; IOL intraocular lens; PCIOL posterior chamber intraocular lens; Phaco/IOL phacoemulsification with intraocular lens  placement; PRK photorefractive keratectomy; LASIK laser assisted in situ keratomileusis; HTN hypertension; DM diabetes mellitus; COPD chronic obstructive pulmonary disease

## 2021-07-21 ENCOUNTER — Other Ambulatory Visit (HOSPITAL_COMMUNITY): Payer: Self-pay

## 2021-07-22 ENCOUNTER — Other Ambulatory Visit (HOSPITAL_COMMUNITY): Payer: Self-pay

## 2021-07-23 ENCOUNTER — Other Ambulatory Visit (HOSPITAL_COMMUNITY): Payer: Self-pay

## 2021-07-28 ENCOUNTER — Other Ambulatory Visit (HOSPITAL_COMMUNITY): Payer: Self-pay

## 2021-07-28 ENCOUNTER — Other Ambulatory Visit: Payer: Self-pay | Admitting: Physician Assistant

## 2021-07-28 MED ORDER — SPIRONOLACTONE 25 MG PO TABS
25.0000 mg | ORAL_TABLET | Freq: Two times a day (BID) | ORAL | 0 refills | Status: DC
  Filled 2021-07-28: qty 180, 90d supply, fill #0

## 2021-07-29 ENCOUNTER — Encounter (INDEPENDENT_AMBULATORY_CARE_PROVIDER_SITE_OTHER): Payer: 59 | Admitting: Ophthalmology

## 2021-07-29 DIAGNOSIS — L930 Discoid lupus erythematosus: Secondary | ICD-10-CM

## 2021-07-29 DIAGNOSIS — Z79899 Other long term (current) drug therapy: Secondary | ICD-10-CM

## 2021-07-29 DIAGNOSIS — H35411 Lattice degeneration of retina, right eye: Secondary | ICD-10-CM

## 2021-08-11 ENCOUNTER — Ambulatory Visit: Payer: 59 | Admitting: Physician Assistant

## 2021-08-11 ENCOUNTER — Encounter: Payer: Self-pay | Admitting: Physician Assistant

## 2021-08-11 ENCOUNTER — Other Ambulatory Visit (HOSPITAL_COMMUNITY): Payer: Self-pay

## 2021-08-11 VITALS — BP 111/73 | HR 61 | Temp 97.2°F | Ht 66.0 in | Wt 142.6 lb

## 2021-08-11 DIAGNOSIS — W57XXXA Bitten or stung by nonvenomous insect and other nonvenomous arthropods, initial encounter: Secondary | ICD-10-CM

## 2021-08-11 DIAGNOSIS — M791 Myalgia, unspecified site: Secondary | ICD-10-CM

## 2021-08-11 DIAGNOSIS — R5383 Other fatigue: Secondary | ICD-10-CM | POA: Diagnosis not present

## 2021-08-11 DIAGNOSIS — S80262A Insect bite (nonvenomous), left knee, initial encounter: Secondary | ICD-10-CM

## 2021-08-11 MED ORDER — DOXYCYCLINE HYCLATE 100 MG PO TABS
100.0000 mg | ORAL_TABLET | Freq: Two times a day (BID) | ORAL | 0 refills | Status: AC
  Filled 2021-08-11: qty 42, 21d supply, fill #0

## 2021-08-11 NOTE — Patient Instructions (Signed)
Take the doxycycline as directed. Reach out if any concerns.  Glad to see you!

## 2021-08-11 NOTE — Progress Notes (Unsigned)
   Subjective:    Patient ID: Debbie [name], female    DOB: 01/13/1991, 31 y.o.   MRN: [id number]  Chief Complaint  Patient presents with   Tick Removal    Pt c/o tick bite 3 wks ago and now experiencing symptoms such as headache fatigue and muscle weakiness; bite back of knee area on LLE, a little redness and bullseye area; pt declines fever     HPI Patient is in today for tick bite concern posterior LLE. Headaches, fatigue, muscle weakness  Past Medical History:  Diagnosis Date   Anxiety    Depression    Lupus ([facility])    Sjogren's syndrome ([facility])     History reviewed. No pertinent surgical history.  Family History  Problem Relation Age of Onset   Thyroid disease Mother    Cancer Maternal Grandmother    Cancer Paternal Grandfather    Hashimoto's thyroiditis Sister    Thyroid disease Sister    Thyroid disease Brother     Social History   Tobacco Use   Smoking status: Some Days    Types: Cigarettes   Smokeless tobacco: Never   Tobacco comments:    Very rarely, social smoker  Vaping Use   Vaping Use: Some days  Substance Use Topics   Alcohol use: Yes    Comment: Rarely   Drug use: Yes    Types: Marijuana     Allergies  Allergen Reactions   Latex Rash   Sulfamethoxazole-Trimethoprim Rash and Swelling    Review of Systems NEGATIVE UNLESS OTHERWISE INDICATED IN HPI      Objective:     BP 111/73 (BP Location: Left Arm)   Pulse 61   Temp (!) 97.2 F (36.2 C) (Temporal)   Ht 5\' 6"  (1.676 m)   Wt 142 lb 9.6 oz (64.7 kg)   SpO2 98%   BMI 23.02 kg/m   Wt Readings from Last 3 Encounters:  08/11/21 142 lb 9.6 oz (64.7 kg)  05/19/21 140 lb (63.5 kg)  04/23/21 137 lb 3.2 oz (62.2 kg)    BP Readings from Last 3 Encounters:  08/11/21 111/73  05/19/21 98/66  04/23/21 96/67     Physical Exam     Assessment & Plan:   Problem List Items Addressed This Visit   None Visit Diagnoses     Tick bite of left popliteal region, initial encounter    -   Primary        No orders of the defined types were placed in this encounter.    No follow-ups on file.  This note was prepared with assistance of 04-30-1983. Occasional wrong-word or sound-a-like substitutions may have occurred due to the inherent limitations of voice recognition software.  Time Spent: *** minutes of total time was spent on the date of the encounter performing the following actions: chart review prior to seeing the patient, obtaining history, performing a medically necessary exam, counseling on the treatment plan, placing orders, and documenting in our EHR.       Bonney Berres M Kelleigh Skerritt, PA-C

## 2021-08-13 ENCOUNTER — Ambulatory Visit: Payer: 59 | Admitting: Physician Assistant

## 2021-09-13 ENCOUNTER — Other Ambulatory Visit (HOSPITAL_COMMUNITY): Payer: Self-pay

## 2021-10-01 ENCOUNTER — Other Ambulatory Visit (HOSPITAL_COMMUNITY): Payer: Self-pay

## 2021-10-01 DIAGNOSIS — F418 Other specified anxiety disorders: Secondary | ICD-10-CM | POA: Diagnosis not present

## 2021-10-01 DIAGNOSIS — F900 Attention-deficit hyperactivity disorder, predominantly inattentive type: Secondary | ICD-10-CM | POA: Diagnosis not present

## 2021-10-01 DIAGNOSIS — F330 Major depressive disorder, recurrent, mild: Secondary | ICD-10-CM | POA: Diagnosis not present

## 2021-10-01 MED ORDER — FLUOXETINE HCL 40 MG PO CAPS
ORAL_CAPSULE | ORAL | 0 refills | Status: DC
  Filled 2021-10-01: qty 90, 90d supply, fill #0

## 2021-10-01 MED ORDER — TRAZODONE HCL 100 MG PO TABS
100.0000 mg | ORAL_TABLET | Freq: Every day | ORAL | 0 refills | Status: DC
  Filled 2021-10-01 – 2021-11-24 (×3): qty 90, 90d supply, fill #0

## 2021-10-02 ENCOUNTER — Other Ambulatory Visit (HOSPITAL_COMMUNITY): Payer: Self-pay

## 2021-10-11 ENCOUNTER — Other Ambulatory Visit (HOSPITAL_COMMUNITY): Payer: Self-pay

## 2021-10-20 ENCOUNTER — Ambulatory Visit (INDEPENDENT_AMBULATORY_CARE_PROVIDER_SITE_OTHER): Payer: 59 | Admitting: Physician Assistant

## 2021-10-20 ENCOUNTER — Encounter: Payer: Self-pay | Admitting: Physician Assistant

## 2021-10-20 VITALS — BP 98/66 | HR 100 | Temp 98.3°F | Ht 66.0 in | Wt 142.0 lb

## 2021-10-20 DIAGNOSIS — Z23 Encounter for immunization: Secondary | ICD-10-CM | POA: Diagnosis not present

## 2021-10-20 DIAGNOSIS — Z0000 Encounter for general adult medical examination without abnormal findings: Secondary | ICD-10-CM | POA: Diagnosis not present

## 2021-10-20 NOTE — Assessment & Plan Note (Signed)
Age-appropriate screening and counseling performed today. Labs updated annually with rheumatology. Preventive measures discussed and printed in AVS for patient. She is doing a great job taking care of herself. Flu shot updated today.  Patient Counseling: [x]   Nutrition: Stressed importance of moderation in sodium/caffeine intake, saturated fat and cholesterol, caloric balance, sufficient intake of fresh fruits, vegetables, and fiber.  [x]   Stressed the importance of regular exercise.   []   Substance Abuse: Discussed cessation/primary prevention of tobacco, alcohol, or other drug use; driving or other dangerous activities under the influence; availability of treatment for abuse.   []   Injury prevention: Discussed safety belts, safety helmets, smoke detector, smoking near bedding or upholstery.   [x]   Sexuality: Discussed sexually transmitted diseases, partner selection, use of condoms, avoidance of unintended pregnancy  and contraceptive alternatives.   [x]   Dental health: Discussed importance of regular tooth brushing, flossing, and dental visits.  [x]   Health maintenance and immunizations reviewed. Please refer to Health maintenance section.

## 2021-10-20 NOTE — Patient Instructions (Signed)
Great work! Great to see you!

## 2021-10-20 NOTE — Progress Notes (Signed)
Subjective:    Patient ID: Debbie [name], female    DOB: 1990-03-19, 31 y.o.   MRN: [id number]  Chief Complaint  Patient presents with   Annual Exam    Pt in for annual CPE; pt is fasting for labs if needed; pt has had thick mucus cough, tested for Covid just to be safe and it was negative. Pt taking OTC Mucinex and is helping some, cough has been productive, pt received flu vaccine today    HPI Patient is in today for annual exam.  Acute concerns: Mucous-cough started about 4 days ago, slight headache, no other symptoms, using Mucinex OTC   Health maintenance: Lifestyle/ exercise: Hiking, bike-riding Nutrition: Vegan diet mostly, doing well Mental health: Doing well, exercise and being outdoors helps Caffeine: Coffee daily  Sleep: Trazodone 100 mg every night helps  Substance use: None ETOH: Occasional  Sexual activity: Active, monogamous  Immunizations: Flu shot today  Colonoscopy: Low-risk, will start at age 22  Pap: Scheduled with GYN in 2 weeks    Past Medical History:  Diagnosis Date   Anxiety    Depression    Lupus ([facility])    Sjogren's syndrome ([facility])     History reviewed. No pertinent surgical history.  Family History  Problem Relation Age of Onset   Thyroid disease Mother    Cancer Maternal Grandmother    Cancer Paternal Grandfather    Hashimoto's thyroiditis Sister    Thyroid disease Sister    Thyroid disease Brother     Social History   Tobacco Use   Smoking status: Some Days    Types: Cigarettes   Smokeless tobacco: Never   Tobacco comments:    Very rarely, social smoker  Vaping Use   Vaping Use: Some days  Substance Use Topics   Alcohol use: Yes    Comment: Rarely   Drug use: Yes    Types: Marijuana     Allergies  Allergen Reactions   Latex Rash   Sulfamethoxazole-Trimethoprim Rash and Swelling    Review of Systems NEGATIVE UNLESS OTHERWISE INDICATED IN HPI      Objective:     BP 98/66 (BP Location: Left Arm)   Pulse  100   Temp 98.3 F (36.8 C) (Temporal)   Ht 5\' 6"  (1.676 m)   Wt 142 lb (64.4 kg)   LMP  (LMP Unknown)   SpO2 98%   BMI 22.92 kg/m   Wt Readings from Last 3 Encounters:  10/20/21 142 lb (64.4 kg)  08/11/21 142 lb 9.6 oz (64.7 kg)  05/19/21 140 lb (63.5 kg)    BP Readings from Last 3 Encounters:  10/20/21 98/66  08/11/21 111/73  05/19/21 98/66     Physical Exam Vitals and nursing note reviewed.  Constitutional:      Appearance: Normal appearance. She is normal weight. She is not toxic-appearing.  HENT:     Head: Normocephalic and atraumatic.     Right Ear: Tympanic membrane, ear canal and external ear normal.     Left Ear: Tympanic membrane, ear canal and external ear normal.     Nose: Nose normal.     Mouth/Throat:     Mouth: Mucous membranes are moist.  Eyes:     Extraocular Movements: Extraocular movements intact.     Conjunctiva/sclera: Conjunctivae normal.     Pupils: Pupils are equal, round, and reactive to light.  Cardiovascular:     Rate and Rhythm: Normal rate and regular rhythm.     Pulses: Normal  pulses.     Heart sounds: Normal heart sounds.  Pulmonary:     Effort: Pulmonary effort is normal.     Breath sounds: Normal breath sounds.  Abdominal:     General: Abdomen is flat. Bowel sounds are normal.     Palpations: Abdomen is soft.  Musculoskeletal:        General: Normal range of motion.     Cervical back: Normal range of motion and neck supple.  Skin:    General: Skin is warm and dry.  Neurological:     General: No focal deficit present.     Mental Status: She is alert and oriented to person, place, and time.  Psychiatric:        Mood and Affect: Mood normal.        Behavior: Behavior normal.        Thought Content: Thought content normal.        Judgment: Judgment normal.        Assessment & Plan:  Encounter for annual physical exam Assessment & Plan: Age-appropriate screening and counseling performed today. Labs updated annually with  rheumatology. Preventive measures discussed and printed in AVS for patient. She is doing a great job taking care of herself. Flu shot updated today.  Patient Counseling: [x]   Nutrition: Stressed importance of moderation in sodium/caffeine intake, saturated fat and cholesterol, caloric balance, sufficient intake of fresh fruits, vegetables, and fiber.  [x]   Stressed the importance of regular exercise.   []   Substance Abuse: Discussed cessation/primary prevention of tobacco, alcohol, or other drug use; driving or other dangerous activities under the influence; availability of treatment for abuse.   []   Injury prevention: Discussed safety belts, safety helmets, smoke detector, smoking near bedding or upholstery.   [x]   Sexuality: Discussed sexually transmitted diseases, partner selection, use of condoms, avoidance of unintended pregnancy  and contraceptive alternatives.   [x]   Dental health: Discussed importance of regular tooth brushing, flossing, and dental visits.  [x]   Health maintenance and immunizations reviewed. Please refer to Health maintenance section.        Need for immunization against influenza -     Flu Vaccine QUAD 43mo+IM (Fluarix, Fluzone & Alfiuria Quad PF)   F/up 1 year or prn    Noble Bodie M Saja Bartolini, PA-C

## 2021-10-27 ENCOUNTER — Other Ambulatory Visit (HOSPITAL_COMMUNITY): Payer: Self-pay

## 2021-10-27 ENCOUNTER — Other Ambulatory Visit: Payer: Self-pay | Admitting: Physician Assistant

## 2021-10-27 MED ORDER — SPIRONOLACTONE 25 MG PO TABS
25.0000 mg | ORAL_TABLET | Freq: Two times a day (BID) | ORAL | 0 refills | Status: DC
  Filled 2021-10-27 – 2021-10-28 (×2): qty 180, 90d supply, fill #0

## 2021-10-28 ENCOUNTER — Other Ambulatory Visit (HOSPITAL_COMMUNITY): Payer: Self-pay

## 2021-11-03 NOTE — Progress Notes (Signed)
Last pap 05/05/20- negative

## 2021-11-04 ENCOUNTER — Ambulatory Visit (INDEPENDENT_AMBULATORY_CARE_PROVIDER_SITE_OTHER): Payer: 59 | Admitting: Obstetrics and Gynecology

## 2021-11-04 ENCOUNTER — Encounter: Payer: Self-pay | Admitting: Obstetrics and Gynecology

## 2021-11-04 VITALS — BP 112/69 | HR 66 | Ht 66.0 in | Wt 143.0 lb

## 2021-11-04 DIAGNOSIS — Z01419 Encounter for gynecological examination (general) (routine) without abnormal findings: Secondary | ICD-10-CM

## 2021-11-04 DIAGNOSIS — Z23 Encounter for immunization: Secondary | ICD-10-CM

## 2021-11-04 NOTE — Addendum Note (Signed)
Addended by: Kathie Dike on: 11/04/2021 03:36 PM   Modules accepted: Orders

## 2021-11-04 NOTE — Progress Notes (Signed)
   ANNUAL EXAM Patient name: Debbie [name] MRN [id number]  Date of birth: [birth year] Chief Complaint:   Annual Exam  History of Present Illness:   Debbie [name] is a 31 y.o. G0P0000 female being seen today for a routine annual exam.   Current complaints: None  No LMP recorded (lmp unknown). (Menstrual status: IUD).  Current birth control: Liletta placed 2018  Last pap: 04/2020. Results were: NILM w/ HRHPV not done. H/O abnormal pap: no  There are no preventive care reminders to display for this patient.  Review of Systems:   Pertinent items are noted in HPI Denies any headaches, blurred vision, fatigue, shortness of breath, chest pain, abdominal pain, abnormal vaginal discharge/itching/odor/irritation, problems with periods, bowel movements, urination, or intercourse unless otherwise stated above.  Pertinent History Reviewed:  Reviewed past medical,surgical, social and family history.  Reviewed problem list, medications and allergies. Physical Assessment:   Vitals:   11/04/21 1506  BP: 112/69  Pulse: 66  Weight: 143 lb (64.9 kg)  Height: 5\' 6"  (1.676 m)  Body mass index is 23.08 kg/m.   Physical Examination:  General appearance - well appearing, and in no distress Mental status - alert, oriented to person, place, and time Psych:  She has a normal mood and affect Skin - warm and dry, normal color, no suspicious lesions noted Chest - effort normal Heart - normal rate  Breasts - breasts appear normal, no suspicious masses, no skin or nipple changes or axillary nodes Abdomen - soft, nontender, nondistended, no masses or organomegaly Pelvic -  VULVA: normal appearing vulva with no masses, tenderness or lesions  VAGINA: normal appearing vagina with normal color and discharge, no lesions  CERVIX: normal appearing cervix without discharge or lesions, no CMT UTERUS: uterus is felt to be normal size, shape, consistency and nontender  ADNEXA: No adnexal masses or  tenderness noted. Extremities:  No swelling or varicosities noted  Chaperone present for exam  No results found for this or any previous visit (from the past 24 hour(s)).  Assessment & Plan:  Debbie [name] was seen today for annual exam.  Diagnoses and all orders for this visit:  Encounter for annual routine gynecological examination  - Cervical cancer screening: Discussed screening Q3 years. Reviewed importance of annual exams and limits of pap smear. Pap with reflex HPV wnl 04/2020. Turns 32 next year - will do Pap with hpv next time.  - GC/CT: Discussed and recommended. Pt  declines - Gardasil: Has not yet had. Counseling provided and pt accepts - Birth Control: IUD - placed 2018 - Breast Health: Encouraged self breast awareness/exams. Teaching provided. - Follow-up: 12 months and prn  - She also got her flu shot today.   No orders of the defined types were placed in this encounter.   Meds: No orders of the defined types were placed in this encounter.   Follow-up: Return in about 1 year (around 11/05/2022) for annual.  11/07/2022, MD 11/04/2021 3:29 PM

## 2021-11-22 ENCOUNTER — Other Ambulatory Visit (HOSPITAL_COMMUNITY): Payer: Self-pay

## 2021-11-23 ENCOUNTER — Other Ambulatory Visit (HOSPITAL_COMMUNITY): Payer: Self-pay

## 2021-11-24 ENCOUNTER — Other Ambulatory Visit (HOSPITAL_COMMUNITY): Payer: Self-pay

## 2021-12-24 ENCOUNTER — Other Ambulatory Visit (HOSPITAL_COMMUNITY): Payer: Self-pay

## 2021-12-24 DIAGNOSIS — F330 Major depressive disorder, recurrent, mild: Secondary | ICD-10-CM | POA: Diagnosis not present

## 2021-12-24 DIAGNOSIS — F900 Attention-deficit hyperactivity disorder, predominantly inattentive type: Secondary | ICD-10-CM | POA: Diagnosis not present

## 2021-12-24 DIAGNOSIS — F418 Other specified anxiety disorders: Secondary | ICD-10-CM | POA: Diagnosis not present

## 2021-12-24 MED ORDER — FLUOXETINE HCL 40 MG PO CAPS
40.0000 mg | ORAL_CAPSULE | Freq: Every day | ORAL | 0 refills | Status: DC
  Filled 2021-12-24 – 2022-06-08 (×2): qty 90, 90d supply, fill #0

## 2021-12-24 MED ORDER — TRAZODONE HCL 100 MG PO TABS
100.0000 mg | ORAL_TABLET | Freq: Every day | ORAL | 0 refills | Status: DC
  Filled 2021-12-24 – 2022-03-14 (×2): qty 90, 90d supply, fill #0

## 2022-01-04 ENCOUNTER — Ambulatory Visit (INDEPENDENT_AMBULATORY_CARE_PROVIDER_SITE_OTHER): Payer: 59

## 2022-01-04 DIAGNOSIS — Z23 Encounter for immunization: Secondary | ICD-10-CM

## 2022-01-04 NOTE — Progress Notes (Signed)
Pt here for second Gardasil injection. Injection given and tolerated well. Pt will return on 05/05/22 for next injection.

## 2022-01-06 ENCOUNTER — Other Ambulatory Visit (HOSPITAL_COMMUNITY): Payer: Self-pay

## 2022-02-01 ENCOUNTER — Other Ambulatory Visit (HOSPITAL_COMMUNITY): Payer: Self-pay

## 2022-02-01 ENCOUNTER — Other Ambulatory Visit: Payer: Self-pay | Admitting: Physician Assistant

## 2022-02-01 MED ORDER — SPIRONOLACTONE 25 MG PO TABS
25.0000 mg | ORAL_TABLET | Freq: Two times a day (BID) | ORAL | 0 refills | Status: DC
  Filled 2022-02-01: qty 180, 90d supply, fill #0

## 2022-02-02 ENCOUNTER — Other Ambulatory Visit (HOSPITAL_COMMUNITY): Payer: Self-pay

## 2022-02-03 ENCOUNTER — Other Ambulatory Visit (HOSPITAL_COMMUNITY): Payer: Self-pay

## 2022-02-03 ENCOUNTER — Ambulatory Visit: Payer: 59 | Admitting: Physician Assistant

## 2022-02-03 ENCOUNTER — Encounter: Payer: Self-pay | Admitting: Physician Assistant

## 2022-02-03 VITALS — BP 106/70 | HR 91 | Temp 97.0°F | Ht 66.0 in | Wt 138.8 lb

## 2022-02-03 DIAGNOSIS — M79642 Pain in left hand: Secondary | ICD-10-CM | POA: Diagnosis not present

## 2022-02-03 DIAGNOSIS — M2142 Flat foot [pes planus] (acquired), left foot: Secondary | ICD-10-CM | POA: Diagnosis not present

## 2022-02-03 DIAGNOSIS — M2141 Flat foot [pes planus] (acquired), right foot: Secondary | ICD-10-CM

## 2022-02-03 DIAGNOSIS — M79641 Pain in right hand: Secondary | ICD-10-CM | POA: Diagnosis not present

## 2022-02-03 DIAGNOSIS — M329 Systemic lupus erythematosus, unspecified: Secondary | ICD-10-CM | POA: Insufficient documentation

## 2022-02-03 DIAGNOSIS — R8290 Unspecified abnormal findings in urine: Secondary | ICD-10-CM

## 2022-02-03 LAB — POC URINALSYSI DIPSTICK (AUTOMATED)
Bilirubin, UA: NEGATIVE
Blood, UA: NEGATIVE
Glucose, UA: NEGATIVE
Ketones, UA: NEGATIVE
Leukocytes, UA: NEGATIVE
Nitrite, UA: NEGATIVE
Protein, UA: NEGATIVE
Spec Grav, UA: <=1.005 — AB (ref 1.010–1.025)
Urobilinogen, UA: 0.2 E.U./dL
pH, UA: 7 (ref 5.0–8.0)

## 2022-02-03 NOTE — Progress Notes (Signed)
+  Subjective:    Patient ID: Debbie [name], female    DOB: August 27, 1990, 31 y.o.   MRN: [id number]  Chief Complaint  Patient presents with   pain in feet     Pt c/o pain in both feet when standing and walking; also hands turned red and tingly on Sunday; very cold and lasted about an hour, wasn't doing anything other than reading. Thinks issues with feet may be plantar fasciitis. Pt wanting UA; not showing signs of UTI but stating she never does and just wants to check and make sure no UTI or infection    HPI Patient is in today for different concerns:  -Foot issues - better yesterday and today. Worried about plantar fasciitis. Three weeks ago, went on longer hike, feet were so sore and limping. Left foot was worse than Right. Worse as the day went on. No known injury. No treatments tried.   -Hands were bright red a few days ago while reading (book on a table), then turned blue color. Some swelling and tingling that day. Cold hands that day.   -Urine darker / cloudier than normal. No burning or frequency. Wants urine checked.  Past Medical History:  Diagnosis Date   Anxiety    Depression    Lupus ([place])    Sjogren's syndrome ([place])     History reviewed. No pertinent surgical history.  Family History  Problem Relation Age of Onset   Thyroid disease Mother    Cancer Maternal Grandmother    Cancer Paternal Grandfather    Hashimoto's thyroiditis Sister    Thyroid disease Sister    Thyroid disease Brother     Social History   Tobacco Use   Smoking status: Some Days    Types: Cigarettes   Smokeless tobacco: Never   Tobacco comments:    Very rarely, social smoker  Vaping Use   Vaping Use: Some days  Substance Use Topics   Alcohol use: Yes    Comment: Rarely   Drug use: Yes    Types: Marijuana     Allergies  Allergen Reactions   Latex Rash   Sulfamethoxazole-Trimethoprim Rash and Swelling    Review of Systems NEGATIVE UNLESS OTHERWISE INDICATED IN HPI       Objective:     BP 106/70 (BP Location: Left Arm)   Pulse 91   Temp (!) 97 F (36.1 C) (Temporal)   Ht 5\' 6"  (1.676 m)   Wt 138 lb 12.8 oz (63 kg)   SpO2 98%   BMI 22.40 kg/m   Wt Readings from Last 3 Encounters:  02/03/22 138 lb 12.8 oz (63 kg)  11/04/21 143 lb (64.9 kg)  10/20/21 142 lb (64.4 kg)    BP Readings from Last 3 Encounters:  02/03/22 106/70  11/04/21 112/69  10/20/21 98/66     Physical Exam Vitals and nursing note reviewed.  Constitutional:      Appearance: Normal appearance.  Cardiovascular:     Rate and Rhythm: Normal rate and regular rhythm.     Pulses: Normal pulses.     Heart sounds: No murmur heard. Pulmonary:     Effort: Pulmonary effort is normal. No respiratory distress.     Breath sounds: Normal breath sounds. No wheezing or rhonchi.  Musculoskeletal:     Right lower leg: No edema.     Left lower leg: No edema.     Comments: Flat arches bilateral feet  Skin:    Findings: No lesion or rash.  Neurological:  General: No focal deficit present.     Mental Status: She is alert and oriented to person, place, and time.  Psychiatric:        Mood and Affect: Mood normal.        Behavior: Behavior normal.        Assessment & Plan:  Cloudy urine -     POCT Urinalysis Dipstick (Automated)  Pes planus of both feet  Pain in both hands   Several concerns addressed today. Pt was having pain in feet for several weeks after long hike, better now. No signs of plantar fasciitis today. She does have flatter feet. Encouraged her to go to [facility] for gait assessment / shoe fitting. Consider PT. Pt agreeable.   N/V intact both hands / wrists. She does have hx of SLE. Could be early signs of Raynaud's. Monitor at this time and then address with her rheum if persistent / recurrent / worsening.  Check UA today, treat if needed. Push fluids.     Return if symptoms worsen or fail to improve.  This note was prepared with assistance of Software engineer. Occasional wrong-word or sound-a-like substitutions may have occurred due to the inherent limitations of voice recognition software.     Wrenley Sayed M Anahid Eskelson, PA-C

## 2022-03-01 ENCOUNTER — Ambulatory Visit: Payer: 59 | Admitting: Internal Medicine

## 2022-03-14 ENCOUNTER — Other Ambulatory Visit (HOSPITAL_COMMUNITY): Payer: Self-pay

## 2022-03-15 ENCOUNTER — Other Ambulatory Visit (HOSPITAL_COMMUNITY): Payer: Self-pay

## 2022-03-18 ENCOUNTER — Other Ambulatory Visit (HOSPITAL_COMMUNITY): Payer: Self-pay

## 2022-03-18 DIAGNOSIS — F330 Major depressive disorder, recurrent, mild: Secondary | ICD-10-CM | POA: Diagnosis not present

## 2022-03-18 DIAGNOSIS — F418 Other specified anxiety disorders: Secondary | ICD-10-CM | POA: Diagnosis not present

## 2022-03-18 DIAGNOSIS — F900 Attention-deficit hyperactivity disorder, predominantly inattentive type: Secondary | ICD-10-CM | POA: Diagnosis not present

## 2022-03-18 MED ORDER — TRAZODONE HCL 100 MG PO TABS
100.0000 mg | ORAL_TABLET | Freq: Every evening | ORAL | 0 refills | Status: DC
  Filled 2022-03-18 – 2022-06-08 (×2): qty 90, 90d supply, fill #0

## 2022-03-18 MED ORDER — FLUOXETINE HCL 40 MG PO CAPS
40.0000 mg | ORAL_CAPSULE | Freq: Every day | ORAL | 0 refills | Status: DC
  Filled 2022-03-18: qty 90, 90d supply, fill #0

## 2022-03-19 ENCOUNTER — Other Ambulatory Visit (HOSPITAL_COMMUNITY): Payer: Self-pay

## 2022-03-23 ENCOUNTER — Encounter: Payer: Self-pay | Admitting: Physician Assistant

## 2022-03-23 ENCOUNTER — Ambulatory Visit (INDEPENDENT_AMBULATORY_CARE_PROVIDER_SITE_OTHER): Payer: 59 | Admitting: Physician Assistant

## 2022-03-23 VITALS — BP 104/68 | HR 62 | Temp 97.8°F | Ht 66.0 in | Wt 140.0 lb

## 2022-03-23 DIAGNOSIS — R300 Dysuria: Secondary | ICD-10-CM | POA: Diagnosis not present

## 2022-03-23 DIAGNOSIS — R8290 Unspecified abnormal findings in urine: Secondary | ICD-10-CM | POA: Diagnosis not present

## 2022-03-23 LAB — POCT URINALYSIS DIPSTICK
Bilirubin, UA: NEGATIVE
Blood, UA: NEGATIVE
Glucose, UA: NEGATIVE
Ketones, UA: NEGATIVE
Nitrite, UA: NEGATIVE
Protein, UA: NEGATIVE
Spec Grav, UA: 1.01 (ref 1.010–1.025)
Urobilinogen, UA: 0.2 E.U./dL
pH, UA: 7 (ref 5.0–8.0)

## 2022-03-23 NOTE — Progress Notes (Signed)
Subjective:    Patient ID: Debbie [name], female    DOB: Dec 13, 1990, 32 y.o.   MRN: [id number]  Chief Complaint  Patient presents with   Urinary Tract Infection    Pt c/o little burning when urinating but not all the time, urine mostly has bad odor per patient. No pain per patient, odor lasting longer than 1 week. Not been eating great and recently started taking fish oil 1 mon thago    Urinary Tract Infection    Patient is in today for change in urine odor x 1 week. Looks clear. Not drinking as much water lately. Some burning with urination. Not eating as well as she could be, no asparagus. Started taking fish oil about 1 month ago. LMP unknown because of IUD, some spotting rarely. No new partners.   Past Medical History:  Diagnosis Date   Anxiety    Depression    Lupus ([place])    Sjogren's syndrome ([place])     History reviewed. No pertinent surgical history.  Family History  Problem Relation Age of Onset   Thyroid disease Mother    Cancer Maternal Grandmother    Cancer Paternal Grandfather    Hashimoto's thyroiditis Sister    Thyroid disease Sister    Thyroid disease Brother     Social History   Tobacco Use   Smoking status: Some Days    Types: Cigarettes   Smokeless tobacco: Never   Tobacco comments:    Very rarely, social smoker  Vaping Use   Vaping Use: Some days  Substance Use Topics   Alcohol use: Yes    Comment: Rarely   Drug use: Yes    Types: Marijuana     Allergies  Allergen Reactions   Latex Rash   Sulfamethoxazole-Trimethoprim Rash and Swelling    Review of Systems NEGATIVE UNLESS OTHERWISE INDICATED IN HPI      Objective:     BP 104/68 (BP Location: Left Arm)   Pulse 62   Temp 97.8 F (36.6 C) (Temporal)   Ht 5\' 6"  (1.676 m)   Wt 140 lb (63.5 kg)   SpO2 98%   BMI 22.60 kg/m   Wt Readings from Last 3 Encounters:  03/23/22 140 lb (63.5 kg)  02/03/22 138 lb 12.8 oz (63 kg)  11/04/21 143 lb (64.9 kg)    BP Readings from  Last 3 Encounters:  03/23/22 104/68  02/03/22 106/70  11/04/21 112/69     Physical Exam Vitals and nursing note reviewed.  Constitutional:      Appearance: Normal appearance.  Cardiovascular:     Rate and Rhythm: Normal rate and regular rhythm.  Pulmonary:     Effort: Pulmonary effort is normal.     Breath sounds: Normal breath sounds.  Abdominal:     General: Abdomen is flat.     Palpations: Abdomen is soft.     Tenderness: There is no right CVA tenderness or left CVA tenderness.  Neurological:     Mental Status: She is alert.  Psychiatric:        Mood and Affect: Mood normal.        Assessment & Plan:  Dysuria -     POCT urinalysis dipstick -     Urine Culture  Abnormal urine odor -     Urine Culture    Trace leukocytes noted in urinalysis today.  Will send for culture and treat pending culture report if there is true infection present.  Most likely, patient is having  odorous urine from fish oil supplements.  Advised that she keep the fish oil in the fridge and take the medication that way or she may just want to switch manufacturers.  Patient knows to follow-up sooner if any changes in symptoms or concerns.    Return if symptoms worsen or fail to improve.  This note was prepared with assistance of Systems analyst. Occasional wrong-word or sound-a-like substitutions may have occurred due to the inherent limitations of voice recognition software.     Sammi Stolarz M Annalaya Wile, PA-C

## 2022-03-26 LAB — URINE CULTURE
MICRO NUMBER:: 14499676
SPECIMEN QUALITY:: ADEQUATE

## 2022-03-27 ENCOUNTER — Other Ambulatory Visit: Payer: Self-pay | Admitting: Physician Assistant

## 2022-03-27 MED ORDER — NITROFURANTOIN MONOHYD MACRO 100 MG PO CAPS
100.0000 mg | ORAL_CAPSULE | Freq: Two times a day (BID) | ORAL | 0 refills | Status: AC
  Filled 2022-03-27: qty 14, 7d supply, fill #0

## 2022-03-28 ENCOUNTER — Other Ambulatory Visit (HOSPITAL_COMMUNITY): Payer: Self-pay

## 2022-03-28 ENCOUNTER — Other Ambulatory Visit: Payer: Self-pay

## 2022-05-03 ENCOUNTER — Other Ambulatory Visit (HOSPITAL_COMMUNITY): Payer: Self-pay

## 2022-05-03 ENCOUNTER — Other Ambulatory Visit: Payer: Self-pay | Admitting: Physician Assistant

## 2022-05-04 ENCOUNTER — Other Ambulatory Visit (HOSPITAL_COMMUNITY): Payer: Self-pay

## 2022-05-04 MED ORDER — SPIRONOLACTONE 25 MG PO TABS
25.0000 mg | ORAL_TABLET | Freq: Two times a day (BID) | ORAL | 0 refills | Status: DC
  Filled 2022-05-04: qty 180, 90d supply, fill #0

## 2022-05-05 ENCOUNTER — Ambulatory Visit (INDEPENDENT_AMBULATORY_CARE_PROVIDER_SITE_OTHER): Payer: 59 | Admitting: *Deleted

## 2022-05-05 DIAGNOSIS — Z23 Encounter for immunization: Secondary | ICD-10-CM | POA: Diagnosis not present

## 2022-05-05 NOTE — Progress Notes (Signed)
Pt here for 3rd Gardasil.. Given IM in Lt deltoid.

## 2022-06-08 ENCOUNTER — Other Ambulatory Visit (HOSPITAL_COMMUNITY): Payer: Self-pay

## 2022-06-09 ENCOUNTER — Other Ambulatory Visit (HOSPITAL_COMMUNITY): Payer: Self-pay

## 2022-06-17 ENCOUNTER — Encounter: Payer: Self-pay | Admitting: Physician Assistant

## 2022-06-17 ENCOUNTER — Ambulatory Visit: Payer: 59 | Admitting: Physician Assistant

## 2022-06-17 VITALS — BP 110/76 | HR 68 | Temp 97.8°F | Ht 66.0 in | Wt 145.2 lb

## 2022-06-17 DIAGNOSIS — R194 Change in bowel habit: Secondary | ICD-10-CM | POA: Diagnosis not present

## 2022-06-17 DIAGNOSIS — D485 Neoplasm of uncertain behavior of skin: Secondary | ICD-10-CM | POA: Diagnosis not present

## 2022-06-17 NOTE — Progress Notes (Signed)
Subjective:    Patient ID: Debbie [name], female    DOB: 1991/01/22, 32 y.o.   MRN: [id number]  Chief Complaint  Patient presents with   Rash    Pt in office c/o skin issues and spots that have changed some and woud like to discuss, also discuss gastro issues the past two weeks, feeling uncomfortable after eating. Bowels have been irregular as well;     Rash   Patient is in today for a few concerns.   Skin -  Place on face, came up last year Place on back, now darker and has changed Neither are itchy or bothersome Grandfather with skin cancer hx No personal hx skin cancer  GI concerns x 2 weeks; feels generally uncomfortable, past three days have been okay. Some days no bowel movements. More loose stools recently. No blood in stool. No cramping. Some nausea in the mornings, usually goes away.  Still has IUD. No chance of pregnancy.  No recent illness.   Past Medical History:  Diagnosis Date   Anxiety    Depression    Lupus ([facility])    Sjogren's syndrome ([facility])     No past surgical history on file.  Family History  Problem Relation Age of Onset   Thyroid disease Mother    Cancer Maternal Grandmother    Cancer Paternal Grandfather    Hashimoto's thyroiditis Sister    Thyroid disease Sister    Thyroid disease Brother     Social History   Tobacco Use   Smoking status: Some Days    Types: Cigarettes   Smokeless tobacco: Never   Tobacco comments:    Very rarely, social smoker  Vaping Use   Vaping Use: Some days  Substance Use Topics   Alcohol use: Yes    Comment: Rarely   Drug use: Yes    Types: Marijuana     Allergies  Allergen Reactions   Latex Rash   Sulfamethoxazole-Trimethoprim Rash and Swelling    Review of Systems  Skin:  Positive for rash.   NEGATIVE UNLESS OTHERWISE INDICATED IN HPI      Objective:     BP 110/76 (BP Location: Left Arm)   Pulse 68   Temp 97.8 F (36.6 C) (Temporal)   Ht 5\' 6"  (1.676 m)   Wt 145 lb 3.2 oz (65.9  kg)   SpO2 98%   BMI 23.44 kg/m   Wt Readings from Last 3 Encounters:  06/17/22 145 lb 3.2 oz (65.9 kg)  03/23/22 140 lb (63.5 kg)  02/03/22 138 lb 12.8 oz (63 kg)    BP Readings from Last 3 Encounters:  06/17/22 110/76  03/23/22 104/68  02/03/22 106/70     Physical Exam Vitals and nursing note reviewed.  Constitutional:      Appearance: Normal appearance.  Eyes:     Extraocular Movements: Extraocular movements intact.     Conjunctiva/sclera: Conjunctivae normal.     Pupils: Pupils are equal, round, and reactive to light.  Cardiovascular:     Rate and Rhythm: Normal rate and regular rhythm.  Pulmonary:     Effort: Pulmonary effort is normal.     Breath sounds: Normal breath sounds.  Abdominal:     General: Abdomen is flat. Bowel sounds are normal. There is no distension.     Palpations: Abdomen is soft. There is no mass.     Tenderness: There is no abdominal tenderness. There is no left CVA tenderness or guarding.  Musculoskeletal:  Right lower leg: No edema.     Left lower leg: No edema.  Skin:    Findings: Lesion (see photos below of two lesions) present.  Neurological:     Mental Status: She is alert and oriented to person, place, and time.  Psychiatric:        Mood and Affect: Mood normal.   R cheek there is a flat pink irregular lesion, new per patient   R lower back, there is a raised flesh-colored lesion with hazy border and areas of darker brown colors       Assessment & Plan:  Neoplasm of uncertain behavior of skin -     Ambulatory referral to Dermatology  Change in bowel habits   1. Neoplasm of uncertain behavior of skin As described above on R cheek and R lower back Referral to dermatology for monitoring Offered to remove R lower back lesion anytime in the meantime  2. Change in bowel habits Symptoms improving in the last 3 days per patient Encouraged her to add probiotic and fiber, such as Activia, daily  She will reach out if any  changes or concerns     Debbie Mccartin M Madelon Welsch, PA-C

## 2022-07-05 ENCOUNTER — Ambulatory Visit (HOSPITAL_BASED_OUTPATIENT_CLINIC_OR_DEPARTMENT_OTHER)
Admission: RE | Admit: 2022-07-05 | Discharge: 2022-07-05 | Disposition: A | Discharge status: Home / Self Care | Payer: 59 | Source: Ambulatory Visit | Attending: Physician Assistant | Admitting: Physician Assistant

## 2022-07-05 ENCOUNTER — Encounter (HOSPITAL_BASED_OUTPATIENT_CLINIC_OR_DEPARTMENT_OTHER): Payer: Self-pay

## 2022-07-05 ENCOUNTER — Ambulatory Visit: Payer: 59 | Admitting: Physician Assistant

## 2022-07-05 ENCOUNTER — Ambulatory Visit (HOSPITAL_COMMUNITY): Payer: 59

## 2022-07-05 ENCOUNTER — Encounter: Payer: Self-pay | Admitting: Physician Assistant

## 2022-07-05 ENCOUNTER — Other Ambulatory Visit (HOSPITAL_BASED_OUTPATIENT_CLINIC_OR_DEPARTMENT_OTHER): Payer: Self-pay

## 2022-07-05 VITALS — BP 104/66 | HR 70 | Temp 97.7°F | Ht 66.0 in | Wt 148.0 lb

## 2022-07-05 DIAGNOSIS — R350 Frequency of micturition: Secondary | ICD-10-CM | POA: Diagnosis not present

## 2022-07-05 DIAGNOSIS — R109 Unspecified abdominal pain: Secondary | ICD-10-CM | POA: Diagnosis not present

## 2022-07-05 DIAGNOSIS — R339 Retention of urine, unspecified: Secondary | ICD-10-CM

## 2022-07-05 DIAGNOSIS — N390 Urinary tract infection, site not specified: Secondary | ICD-10-CM

## 2022-07-05 LAB — POC URINALSYSI DIPSTICK (AUTOMATED)
Bilirubin, UA: NEGATIVE
Blood, UA: NEGATIVE
Glucose, UA: NEGATIVE
Ketones, UA: NEGATIVE
Leukocytes, UA: NEGATIVE
Nitrite, UA: NEGATIVE
Protein, UA: NEGATIVE
Spec Grav, UA: 1.015 (ref 1.010–1.025)
Urobilinogen, UA: 0.2 E.U./dL
pH, UA: 6 (ref 5.0–8.0)

## 2022-07-05 LAB — POCT URINE PREGNANCY: Preg Test, Ur: NEGATIVE

## 2022-07-05 MED ORDER — CEFTRIAXONE SODIUM 1 G IJ SOLR
1.0000 g | Freq: Once | INTRAMUSCULAR | Status: AC
Start: 2022-07-05 — End: 2022-07-05
  Administered 2022-07-05: 1 g via INTRAMUSCULAR

## 2022-07-05 MED ORDER — NITROFURANTOIN MONOHYD MACRO 100 MG PO CAPS
100.0000 mg | ORAL_CAPSULE | Freq: Two times a day (BID) | ORAL | 0 refills | Status: AC
  Filled 2022-07-05: qty 14, 7d supply, fill #0

## 2022-07-05 NOTE — Addendum Note (Signed)
Addended by: Ila Mcgill on: 07/05/2022 11:10 AM   Modules accepted: Orders

## 2022-07-05 NOTE — Progress Notes (Signed)
Subjective:    Patient ID: Debbie [name], female    DOB: 1990-11-20, 32 y.o.   MRN: [id number]  Chief Complaint  Patient presents with   Urinary Tract Infection    Pt in office for possible UTI; pt has been having frequent urination since late last week with little output and urgency; then Sunday left kidney started hurting and felt severe, yesterday tolerable pain but frequent urination, last night pain started again effecting sleep, moving, coughing cause pain to be more severe    HPI Patient is in today for concern of possible UTI as described in CC.   Pain currently 6/10 left flank, lower back, this started two days ago. Urinary issues / frequency started last week.  Cough or movement worsens the pain. Urination worsens the pain. Normal force with urine. Feels like incomplete voiding though. No dysuria. No blood in urine.   No hx of kidney stones; parents have both had them.  Nausea this morning, vomiting this morning.   No vaginal discharge or other symptoms.   IUD in place; has not had cycles with IUD.   Past Medical History:  Diagnosis Date   Anxiety    Depression    Lupus ([facility])    Sjogren's syndrome ([facility])     No past surgical history on file.  Family History  Problem Relation Age of Onset   Thyroid disease Mother    Cancer Maternal Grandmother    Cancer Paternal Grandfather    Hashimoto's thyroiditis Sister    Thyroid disease Sister    Thyroid disease Brother     Social History   Tobacco Use   Smoking status: Some Days    Types: Cigarettes   Smokeless tobacco: Never   Tobacco comments:    Very rarely, social smoker  Vaping Use   Vaping Use: Some days  Substance Use Topics   Alcohol use: Yes    Comment: Rarely   Drug use: Yes    Types: Marijuana     Allergies  Allergen Reactions   Latex Rash   Sulfamethoxazole-Trimethoprim Rash and Swelling    Review of Systems NEGATIVE UNLESS OTHERWISE INDICATED IN HPI      Objective:     BP  104/66 (BP Location: Left Arm)   Pulse 70   Temp 97.7 F (36.5 C) (Temporal)   Ht 5\' 6"  (1.676 m)   Wt 148 lb (67.1 kg)   SpO2 100%   BMI 23.89 kg/m   Wt Readings from Last 3 Encounters:  07/05/22 148 lb (67.1 kg)  06/17/22 145 lb 3.2 oz (65.9 kg)  03/23/22 140 lb (63.5 kg)    BP Readings from Last 3 Encounters:  07/05/22 104/66  06/17/22 110/76  03/23/22 104/68     Physical Exam Vitals and nursing note reviewed.  Constitutional:      General: She is not in acute distress.    Appearance: Normal appearance. She is not ill-appearing.     Comments: Uncomfortable, holding her left flank  HENT:     Head: Normocephalic and atraumatic.  Cardiovascular:     Rate and Rhythm: Normal rate and regular rhythm.     Pulses: Normal pulses.     Heart sounds: Normal heart sounds.  Pulmonary:     Effort: Pulmonary effort is normal.     Breath sounds: Normal breath sounds.  Abdominal:     General: Abdomen is flat. Bowel sounds are normal. There is no distension.     Palpations: Abdomen is soft. There  is no mass.     Tenderness: There is no abdominal tenderness. There is left CVA tenderness. There is no right CVA tenderness or guarding.  Musculoskeletal:     Right lower leg: No edema.     Left lower leg: No edema.  Skin:    General: Skin is warm and dry.  Neurological:     General: No focal deficit present.     Mental Status: She is alert.  Psychiatric:        Mood and Affect: Mood normal.        Assessment & Plan:  Acute left flank pain -     POCT urine pregnancy -     CT RENAL STONE STUDY; Future  Urinary frequency -     POCT Urinalysis Dipstick (Automated) -     POCT urine pregnancy -     CT RENAL STONE STUDY; Future  Urinary retention -     CT RENAL STONE STUDY; Future   Urinalysis normal, but patient has acute worsening left flank pain, urinary retention / frequency, nausea, and vomiting; at this time, needs CT renal stone study to r/o underlying pathology such as  renal stone, pyelonephritis, cyst, etc. Pt will go to have CT scan done now, will wait there for me to call with results and further plan.     Akiva Brassfield M Tad Fancher, PA-C

## 2022-07-06 ENCOUNTER — Other Ambulatory Visit: Payer: Self-pay | Admitting: Physician Assistant

## 2022-07-06 ENCOUNTER — Other Ambulatory Visit (HOSPITAL_BASED_OUTPATIENT_CLINIC_OR_DEPARTMENT_OTHER): Payer: Self-pay

## 2022-07-06 ENCOUNTER — Telehealth: Payer: Self-pay | Admitting: Physician Assistant

## 2022-07-06 LAB — URINE CULTURE: SPECIMEN QUALITY:: ADEQUATE

## 2022-07-06 MED ORDER — ONDANSETRON HCL 4 MG PO TABS
4.0000 mg | ORAL_TABLET | Freq: Three times a day (TID) | ORAL | 0 refills | Status: DC | PRN
  Filled 2022-07-06: qty 20, 7d supply, fill #0

## 2022-07-06 NOTE — Telephone Encounter (Signed)
Called pt to advise, pt is going to let us know if she is continuing to get worse. Pt states running fever off and on since yesterday afternoon and in quite a bit of pain. Pt has also been vomiting as well. Advised to please keep PCP notified if symptoms persist or get worse. Pt verbalized understanding

## 2022-07-06 NOTE — Telephone Encounter (Signed)
Please see pt msg and advise 

## 2022-07-06 NOTE — Telephone Encounter (Signed)
Noted and agreed, thank you. 

## 2022-07-06 NOTE — Telephone Encounter (Signed)
Pt was seen yesterday and would like to know if she can get an RX for Zofran because of her constant vomiting and she has not eaten since Monday. Pleas advise.

## 2022-07-08 LAB — URINE CULTURE: MICRO NUMBER:: 14953926

## 2022-07-13 ENCOUNTER — Other Ambulatory Visit (HOSPITAL_COMMUNITY)
Admission: RE | Admit: 2022-07-13 | Discharge: 2022-07-13 | Disposition: A | Discharge status: Home / Self Care | Payer: 59 | Source: Ambulatory Visit | Attending: Physician Assistant | Admitting: Physician Assistant

## 2022-07-13 ENCOUNTER — Encounter: Payer: Self-pay | Admitting: Physician Assistant

## 2022-07-13 ENCOUNTER — Ambulatory Visit: Payer: 59 | Admitting: Physician Assistant

## 2022-07-13 VITALS — BP 102/64 | HR 59 | Temp 98.0°F | Ht 66.0 in | Wt 145.8 lb

## 2022-07-13 DIAGNOSIS — N390 Urinary tract infection, site not specified: Secondary | ICD-10-CM | POA: Diagnosis not present

## 2022-07-13 DIAGNOSIS — D485 Neoplasm of uncertain behavior of skin: Secondary | ICD-10-CM | POA: Insufficient documentation

## 2022-07-13 DIAGNOSIS — D225 Melanocytic nevi of trunk: Secondary | ICD-10-CM

## 2022-07-13 NOTE — Progress Notes (Signed)
Subjective:    Patient ID: Debbie [name], female    DOB: 11-Aug-1990, 32 y.o.   MRN: [id number]  Chief Complaint  Patient presents with   Skin Tag    Pt has skin tag/mole on back wanting to be looked at and/ or removed. Pt was unable to get in with dermatology and was told to come back for removal if unable to get in at previous visit with PCP; pt requesting yearly labs as well but forgot to fast    HPI Patient is in today for mole biopsy of lower back.  She is also requesting urine to be retested today after significant infection the other week. (Feels better, symptoms resolved with treatment).   Past Medical History:  Diagnosis Date   Anxiety    Depression    Lupus ([facility])    Sjogren's syndrome ([facility])     No past surgical history on file.  Family History  Problem Relation Age of Onset   Thyroid disease Mother    Cancer Maternal Grandmother    Cancer Paternal Grandfather    Hashimoto's thyroiditis Sister    Thyroid disease Sister    Thyroid disease Brother     Social History   Tobacco Use   Smoking status: Some Days    Types: Cigarettes   Smokeless tobacco: Never   Tobacco comments:    Very rarely, social smoker  Vaping Use   Vaping Use: Some days  Substance Use Topics   Alcohol use: Yes    Comment: Rarely   Drug use: Yes    Types: Marijuana     Allergies  Allergen Reactions   Latex Rash   Sulfamethoxazole-Trimethoprim Rash and Swelling    Review of Systems NEGATIVE UNLESS OTHERWISE INDICATED IN HPI      Objective:     BP 102/64 (BP Location: Left Arm)   Pulse (!) 59   Temp 98 F (36.7 C) (Temporal)   Ht 5\' 6"  (1.676 m)   Wt 145 lb 12.8 oz (66.1 kg)   SpO2 98%   BMI 23.53 kg/m   Wt Readings from Last 3 Encounters:  07/13/22 145 lb 12.8 oz (66.1 kg)  07/05/22 148 lb (67.1 kg)  06/17/22 145 lb 3.2 oz (65.9 kg)    BP Readings from Last 3 Encounters:  07/13/22 102/64  07/05/22 104/66  06/17/22 110/76     Physical Exam Vitals and  nursing note reviewed.  Constitutional:      Appearance: Normal appearance.  Skin:    Findings: Lesion (see photo below - Right lower back there is approx 3 mm slightly raised flesh-colored - brown nevus with hazy borders) present.  Neurological:     General: No focal deficit present.     Mental Status: She is alert.  Psychiatric:        Mood and Affect: Mood normal.        Behavior: Behavior normal.        Assessment & Plan:  Neoplasm of uncertain behavior of skin -     Surgical pathology  Recurrent UTI -     Urine Culture    Procedure note: Shave biopsy of nevus of lower back as described above.  Procedure explained and consent obtained from the patient today.  Verified that he had no allergies that would be involved in the procedure today.  The area was prepped in the usual manner with iodine cleansing solution.  Local anesthesia achieved with 2 cc of lidocaine with epinephrine using a 30-gauge  needle.  A 3 mm punch biopsy was performed on the border of the raised lesion and the area of new appearing change.  The sample was sent to pathology.  Bleeding controlled with electrocautery.  Healing will take place by secondary intention and this was explained to the patient.  She tolerated the procedure very well.  Band-Aid with Vaseline was placed.  Aftercare instructions provided to the patient.     Return if symptoms worsen or fail to improve.    Yasuo Phimmasone M Shakena Callari, PA-C

## 2022-07-14 LAB — URINE CULTURE
MICRO NUMBER:: 14990304
Result:: NO GROWTH
SPECIMEN QUALITY:: ADEQUATE

## 2022-07-15 LAB — SURGICAL PATHOLOGY

## 2022-08-04 ENCOUNTER — Ambulatory Visit (INDEPENDENT_AMBULATORY_CARE_PROVIDER_SITE_OTHER): Payer: 59 | Admitting: Obstetrics and Gynecology

## 2022-08-04 VITALS — BP 119/70 | HR 71 | Ht 66.0 in | Wt 144.0 lb

## 2022-08-04 DIAGNOSIS — Z3009 Encounter for other general counseling and advice on contraception: Secondary | ICD-10-CM | POA: Diagnosis not present

## 2022-08-04 NOTE — Progress Notes (Signed)
RETURN GYNECOLOGY VISIT  Subjective:  Debbie [name] is a 32 y.o. G0 with IUD  in place presenting for sterilization consultation  Does not desire future fertility. Does not think she ever wants to have children and if she did, she would adopt/foster. She has a Liletta IUD in place that she is happy with, but she is worried about the political climate and if she will be able to get an IUD in the future. Permanent sterilization would help alleviate those concerns. She feels certain that she does not desire future fertility and wants to get more information and sterilization today.  Past Medical History:  Diagnosis Date   Anxiety    Depression    Lupus ([facility])    Sjogren's syndrome ([facility])    No past surgical history on file.  Current Outpatient Medications on File Prior to Visit  Medication Sig Dispense Refill   cholecalciferol (VITAMIN D3) 25 MCG (1000 UNIT) tablet Take 1,000 Units by mouth daily.     FLUoxetine (PROZAC) 40 MG capsule Take 1 capsule (40 mg total) by mouth daily. 90 capsule 0   lactobacillus acidophilus (BACID) TABS tablet Take 2 tablets by mouth daily.     levonorgestrel (LILETTA, 52 MG,) 20.1 MCG/DAY IUD IUD 1 each by Intrauterine route once.     ondansetron (ZOFRAN) 4 MG tablet Take 1 tablet (4 mg total) by mouth every 8 (eight) hours as needed for nausea or vomiting. 20 tablet 0   spironolactone (ALDACTONE) 25 MG tablet Take 1 tablet by mouth 2 times daily. 180 tablet 0   traZODone (DESYREL) 100 MG tablet Take 1 tablet (100 mg total) by mouth at bedtime. 90 tablet 0   No current facility-administered medications on file prior to visit.   Allergies  Allergen Reactions   Latex Rash   Sulfamethoxazole-Trimethoprim Rash and Swelling   OB History     Gravida  0   Para  0   Term  0   Preterm  0   AB  0   Living  0      SAB  0   IAB  0   Ectopic  0   Multiple  0   Live Births  0          Social History   Socioeconomic History    Marital status: Married    Spouse name: Not on file   Number of children: Not on file   Years of education: Not on file   Highest education level: Not on file  Occupational History   Not on file  Tobacco Use   Smoking status: Some Days    Types: Cigarettes   Smokeless tobacco: Never   Tobacco comments:    Very rarely, social smoker  Vaping Use   Vaping Use: Some days  Substance and Sexual Activity   Alcohol use: Yes    Comment: Rarely   Drug use: Yes    Types: Marijuana   Sexual activity: Yes    Birth control/protection: I.U.D.  Other Topics Concern   Not on file  Social History Narrative   Not on file   Social Determinants of Health   Financial Resource Strain: Not on file  Food Insecurity: Not on file  Transportation Needs: Not on file  Physical Activity: Not on file  Stress: Not on file  Social Connections: Not on file  Intimate Partner Violence: Not on file   Objective:   Vitals:   08/04/22 1606  BP: 119/70  Pulse:  71  Weight: 144 lb (65.3 kg)  Height: 5\' 6"  (1.676 m)   General:  Alert, oriented and cooperative. Patient is in no acute distress.  Skin: Skin is warm and dry. No rash noted.   Cardiovascular: Normal heart rate noted  Respiratory: Normal respiratory effort, no problems with respiration noted   Assessment and Plan:  Debbie [name] is a 32 y.o. presenting for sterilization consult  Sterilization consult - Discussed surgery of salpingectomy vs tubal ligation. Recommended bilateral salpingectomy due to lower risk of contraceptive failure as well as potential risk reduction for ovarian cancer - Discussed alternatives including LARC options which have benefit of lower risk, reversibility, and equivalent efficacy - Risks of surgery include but are not limited to: bleeding, infection, injury to surrounding organs/tissues (i.e. bowel/bladder/ureters), need for additional procedures, wound complications, hospital re-admission, and conversion to open  surgery, VTE. We reviewed risk of contraceptive failure and risk of regret.  - Reviewed restrictions and recovery following surgery  She does not have time to get her surgery this year. Is interested in scheduling next year. She will call to request a virtual pre operative appointment when she is ready to schedule surgery.  I spent a total of 25 minutes reviewing the patient's chart, interviewing/counseling the patient, and documenting our encounter.   Future Appointments  Date Time Provider Department Center  10/25/2022  8:00 AM Allwardt, Crist Infante, PA-C [facility]   Lennart Pall, MD

## 2022-08-10 DIAGNOSIS — N3289 Other specified disorders of bladder: Secondary | ICD-10-CM | POA: Diagnosis not present

## 2022-08-10 DIAGNOSIS — N3020 Other chronic cystitis without hematuria: Secondary | ICD-10-CM | POA: Diagnosis not present

## 2022-08-31 ENCOUNTER — Other Ambulatory Visit: Payer: Self-pay | Admitting: Physician Assistant

## 2022-09-01 ENCOUNTER — Other Ambulatory Visit (HOSPITAL_COMMUNITY): Payer: Self-pay

## 2022-09-01 MED ORDER — SPIRONOLACTONE 25 MG PO TABS
25.0000 mg | ORAL_TABLET | Freq: Two times a day (BID) | ORAL | 0 refills | Status: DC
  Filled 2022-09-01: qty 180, 90d supply, fill #0

## 2022-09-12 ENCOUNTER — Other Ambulatory Visit (HOSPITAL_COMMUNITY): Payer: Self-pay

## 2022-09-14 ENCOUNTER — Other Ambulatory Visit (HOSPITAL_COMMUNITY): Payer: Self-pay

## 2022-09-14 DIAGNOSIS — F900 Attention-deficit hyperactivity disorder, predominantly inattentive type: Secondary | ICD-10-CM | POA: Diagnosis not present

## 2022-09-14 DIAGNOSIS — F330 Major depressive disorder, recurrent, mild: Secondary | ICD-10-CM | POA: Diagnosis not present

## 2022-09-14 DIAGNOSIS — F418 Other specified anxiety disorders: Secondary | ICD-10-CM | POA: Diagnosis not present

## 2022-09-14 MED ORDER — FLUOXETINE HCL 40 MG PO CAPS
40.0000 mg | ORAL_CAPSULE | Freq: Every day | ORAL | 0 refills | Status: DC
  Filled 2022-09-14: qty 90, 90d supply, fill #0

## 2022-09-14 MED ORDER — QUETIAPINE FUMARATE 50 MG PO TABS
50.0000 mg | ORAL_TABLET | Freq: Every day | ORAL | 0 refills | Status: DC
  Filled 2022-09-14: qty 30, 30d supply, fill #0

## 2022-10-01 ENCOUNTER — Other Ambulatory Visit (HOSPITAL_COMMUNITY): Payer: Self-pay

## 2022-10-12 ENCOUNTER — Other Ambulatory Visit (HOSPITAL_COMMUNITY): Payer: Self-pay

## 2022-10-12 DIAGNOSIS — F330 Major depressive disorder, recurrent, mild: Secondary | ICD-10-CM | POA: Diagnosis not present

## 2022-10-12 DIAGNOSIS — F900 Attention-deficit hyperactivity disorder, predominantly inattentive type: Secondary | ICD-10-CM | POA: Diagnosis not present

## 2022-10-12 DIAGNOSIS — F418 Other specified anxiety disorders: Secondary | ICD-10-CM | POA: Diagnosis not present

## 2022-10-12 MED ORDER — TRAZODONE HCL 50 MG PO TABS
50.0000 mg | ORAL_TABLET | Freq: Every day | ORAL | 0 refills | Status: DC
  Filled 2022-10-12: qty 90, 90d supply, fill #0

## 2022-10-12 MED ORDER — QUETIAPINE FUMARATE 25 MG PO TABS
25.0000 mg | ORAL_TABLET | Freq: Every day | ORAL | 0 refills | Status: DC
  Filled 2022-10-12: qty 90, 90d supply, fill #0

## 2022-10-13 ENCOUNTER — Other Ambulatory Visit (HOSPITAL_COMMUNITY): Payer: Self-pay

## 2022-10-17 ENCOUNTER — Telehealth (INDEPENDENT_AMBULATORY_CARE_PROVIDER_SITE_OTHER): Payer: 59 | Admitting: Obstetrics and Gynecology

## 2022-10-17 ENCOUNTER — Encounter: Payer: Self-pay | Admitting: Obstetrics and Gynecology

## 2022-10-17 DIAGNOSIS — Z3009 Encounter for other general counseling and advice on contraception: Secondary | ICD-10-CM

## 2022-10-17 DIAGNOSIS — Z302 Encounter for sterilization: Secondary | ICD-10-CM | POA: Insufficient documentation

## 2022-10-17 NOTE — Progress Notes (Signed)
RETURN GYNECOLOGY VISIT  Subjective:  Debbie [name] is a 32 y.o. G0P0000 with Liletta IUD in place presenting for pre operative discussion for bilateral salpingectomy  First met with patient 08/04/22 to discuss permanent sterilization.  Does not desire future fertility. Does not think she ever wants to have children and if she did, she would adopt/foster. Has concerns about ethics of bringing children into the world when many children in the world aren't cared for. She has a Liletta IUD in place that she is happy with, but she is worried about the political climate and if she will be able to get an IUD/good contraception in the future. Permanent sterilization would help alleviate those concerns.  She is confident in her decision for permanent sterilization and would like to move forward with scheduling surgery. She again confirms the above history and feels certain of her desire for surgical sterilization.   Past Medical History:  Diagnosis Date   Anxiety    Depression    Lupus (HCC)    Sjogren's syndrome (HCC)    No past surgical history on file.  Cystoscopy in childhood  Current Outpatient Medications on File Prior to Visit  Medication Sig Dispense Refill   cholecalciferol (VITAMIN D3) 25 MCG (1000 UNIT) tablet Take 1,000 Units by mouth daily.     FLUoxetine (PROZAC) 40 MG capsule Take 1 capsule (40 mg total) by mouth daily. 90 capsule 0   FLUoxetine (PROZAC) 40 MG capsule Take 1 capsule (40 mg total) by mouth daily. 90 capsule 0   lactobacillus acidophilus (BACID) TABS tablet Take 2 tablets by mouth daily.     levonorgestrel (LILETTA, 52 MG,) 20.1 MCG/DAY IUD IUD 1 each by Intrauterine route once.     spironolactone (ALDACTONE) 25 MG tablet Take 1 tablet by mouth 2 times daily. 180 tablet 0   traZODone (DESYREL) 100 MG tablet Take 1 tablet (100 mg total) by mouth at bedtime. 90 tablet 0   traZODone (DESYREL) 50 MG tablet Take 1 tablet (50 mg total) by mouth at bedtime. 90  tablet 0   No current facility-administered medications on file prior to visit.   Allergies  Allergen Reactions   Latex Rash   Sulfamethoxazole-Trimethoprim Rash and Swelling   OB History     Gravida  0   Para  0   Term  0   Preterm  0   AB  0   Living  0      SAB  0   IAB  0   Ectopic  0   Multiple  0   Live Births  0          Social History   Socioeconomic History   Marital status: Married    Spouse name: Not on file   Number of children: Not on file   Years of education: Not on file   Highest education level: Not on file  Occupational History   Not on file  Tobacco Use   Smoking status: Some Days    Types: Cigarettes   Smokeless tobacco: Never   Tobacco comments:    Very rarely, social smoker  Vaping Use   Vaping status: Some Days  Substance and Sexual Activity   Alcohol use: Yes    Comment: Rarely   Drug use: Yes    Types: Marijuana   Sexual activity: Yes    Birth control/protection: I.U.D.  Other Topics Concern   Not on file  Social History Narrative   Not on file  Social Determinants of Health   Financial Resource Strain: Not on file  Food Insecurity: Not on file  Transportation Needs: Not on file  Physical Activity: Not on file  Stress: Not on file  Social Connections: Not on file  Intimate Partner Violence: Not on file   UTD on pap, no hx abnormals Objective:  There were no vitals filed for this visit. - virtual visit  General:  Alert, oriented and cooperative. Patient is in no acute distress.  Skin: Skin is warm and dry. No rash noted.   Respiratory: Normal respiratory effort, no problems with respiration noted   Assessment and Plan:  Debbie [name] is a 32 y.o. with request for sterilization  Sterilization consult Request for sterilization - She desires permanent sterilization. Discussed alternatives including LARC options and vasectomy - reviewed that they are equally efficacious with lower risk and advantage  of being reversible. She declines these options.  - Discussed surgery of salpingectomy due to higher contraceptive efficacy and potentially lower risk for ovarian cancer. She would like to proceed with salpingectomy - Risks of surgery include but are not limited to: bleeding, infection, injury to surrounding organs/tissues (i.e. bowel/bladder/ureters), need for additional procedures, wound complications, hospital re-admission, and conversion to open surgery, VTE. We reviewed risk of contraceptive failure and risk of regret.  - Reviewed restrictions and recovery following surgery - Pre op testing per anesthesia  Message routed to surgery scheduling.   Future Appointments  Date Time Provider Department Center  11/17/2022  3:50 PM Lennart Pall, MD [facility]  12/08/2022  1:00 PM Allwardt, Milus Mallick [facility]  01/24/2023  3:30 PM Terri Piedra, DO [facility] None    Lennart Pall, MD

## 2022-10-18 ENCOUNTER — Other Ambulatory Visit (HOSPITAL_COMMUNITY): Payer: Self-pay

## 2022-10-25 ENCOUNTER — Encounter: Payer: 59 | Admitting: Physician Assistant

## 2022-10-28 ENCOUNTER — Telehealth: Payer: Self-pay

## 2022-10-28 NOTE — Telephone Encounter (Signed)
Called patient to schedule surgery w/ Dr. Berton Lan. Patient would like me to call her back once the December 2024 schedule is published. Patient is hoping to be scheduled at the end of December.

## 2022-11-17 ENCOUNTER — Encounter: Payer: Self-pay | Admitting: Obstetrics and Gynecology

## 2022-11-17 ENCOUNTER — Ambulatory Visit: Payer: 59 | Admitting: Obstetrics and Gynecology

## 2022-11-17 VITALS — BP 100/64 | HR 68 | Resp 16 | Ht 66.0 in | Wt 143.0 lb

## 2022-11-17 DIAGNOSIS — Z01419 Encounter for gynecological examination (general) (routine) without abnormal findings: Secondary | ICD-10-CM

## 2022-11-17 NOTE — Progress Notes (Signed)
ANNUAL EXAM Patient name: Debbie [name] MRN [id number]  Date of birth: 01-16-1991 Chief Complaint:   Annual Exam  History of Present Illness:   Debbie [name] is a 32 y.o. G0P0000 with No LMP recorded. (Menstrual status: IUD). being seen today for a routine annual exam.  Current complaints: None   Upstream - 11/17/22 1621       Pregnancy Intention Screening   Does the patient want to become pregnant in the next year? No    Does the patient's partner want to become pregnant in the next year? N/A    Would the patient like to discuss contraceptive options today? No      Contraception Wrap Up   Current Method IUD or IUS    End Method Female Sterilization;IUD or IUS    Contraception Counseling Provided No    How was the end contraceptive method provided? N/A            The pregnancy intention screening data noted above was reviewed. Potential methods of contraception were discussed. The patient elected to proceed with Female Sterilization; IUD or IUS.   Last pap 05/05/20. Results were: NILM Last mammogram: n/a. Results were: N/A. Family h/o breast cancer: no Last colonoscopy: n/a. Results were: N/A. Family h/o colorectal cancer: no HPV vaccine: completed     06/17/2022    1:01 PM 03/23/2022   11:40 AM 02/03/2022    2:08 PM 08/11/2021   11:51 AM 10/19/2020    9:38 AM  Depression screen PHQ 2/9  Decreased Interest 2 1 0 1 1  Down, Depressed, Hopeless 1 1 0 1 1  PHQ - 2 Score 3 2 0 2 2  Altered sleeping 0 1  2 2   Tired, decreased energy 2 1  2 2   Change in appetite 2 1  1 1   Feeling bad or failure about yourself  0 1  0 0  Trouble concentrating 1 1  0 1  Moving slowly or fidgety/restless 0 0  1 1  Suicidal thoughts 0 0  0 0  PHQ-9 Score 8 7  8 9   Difficult doing work/chores Somewhat difficult Somewhat difficult  Somewhat difficult Somewhat difficult        06/17/2022    1:01 PM 03/23/2022   11:41 AM 05/05/2020   11:44 AM  GAD 7 : Generalized Anxiety Score   Nervous, Anxious, on Edge 0 1 1  Control/stop worrying 0 0 1  Worry too much - different things 0 0 1  Trouble relaxing 0 1 0  Restless 0 0 1  Easily annoyed or irritable 1 1 1   Afraid - awful might happen 0 0 0  Total GAD 7 Score 1 3 5   Anxiety Difficulty Not difficult at all Not difficult at all Somewhat difficult     Review of Systems:   Pertinent items are noted in HPI Denies any headaches, blurred vision, fatigue, shortness of breath, chest pain, abdominal pain, abnormal vaginal discharge/itching/odor/irritation, problems with periods, bowel movements, urination, or intercourse unless otherwise stated above. Pertinent History Reviewed:  Reviewed past medical,surgical, social and family history.  Reviewed problem list, medications and allergies. Physical Assessment:   Vitals:   11/17/22 1558  BP: 100/64  Pulse: 68  Resp: 16  Weight: 143 lb (64.9 kg)  Height: 5\' 6"  (1.676 m)  Body mass index is 23.08 kg/m.        Physical Examination:   General appearance - well appearing, and in no distress  Mental status -  alert, oriented to person, place, and time  Chest - respiratory effort normal  Heart - normal peripheral perfusion  Breasts - breasts appear normal, no suspicious masses, no skin or nipple changes or axillary nodes  Abdomen - soft, nontender, nondistended, no masses or organomegaly  Pelvic - deferred  Chaperone present for exam  No results found for this or any previous visit (from the past 24 hour(s)).  Assessment & Plan:  1) Well-Woman Exam Mammogram: @ 30yo, or sooner if problems Colonoscopy: @ 30yo, or sooner if problems Pap: UTD Gardasil: Completed GC/CT: Declines HIV/HCV: Declines  Labs/procedures today: None  Meds: None  Follow-up: For pap 04/2023  Lennart Pall, MD 11/17/2022 4:22 PM

## 2022-12-02 ENCOUNTER — Telehealth: Payer: Self-pay

## 2022-12-02 NOTE — Telephone Encounter (Signed)
Second attempt to reach patient to schedule surgery w/ Dr. Berton Lan. Left voicemail asking patient to call me back at [phone].

## 2022-12-08 ENCOUNTER — Other Ambulatory Visit (HOSPITAL_COMMUNITY): Payer: Self-pay

## 2022-12-08 ENCOUNTER — Ambulatory Visit: Payer: 59 | Admitting: Physician Assistant

## 2022-12-08 ENCOUNTER — Other Ambulatory Visit (HOSPITAL_COMMUNITY)
Admission: RE | Admit: 2022-12-08 | Discharge: 2022-12-08 | Disposition: A | Discharge status: Home / Self Care | Payer: 59 | Source: Ambulatory Visit | Attending: Physician Assistant | Admitting: Physician Assistant

## 2022-12-08 ENCOUNTER — Encounter: Payer: Self-pay | Admitting: Physician Assistant

## 2022-12-08 VITALS — BP 100/64 | HR 60 | Temp 97.5°F | Ht 66.0 in | Wt 147.2 lb

## 2022-12-08 DIAGNOSIS — Z0000 Encounter for general adult medical examination without abnormal findings: Secondary | ICD-10-CM

## 2022-12-08 DIAGNOSIS — Z113 Encounter for screening for infections with a predominantly sexual mode of transmission: Secondary | ICD-10-CM | POA: Diagnosis not present

## 2022-12-08 DIAGNOSIS — N390 Urinary tract infection, site not specified: Secondary | ICD-10-CM

## 2022-12-08 LAB — COMPREHENSIVE METABOLIC PANEL
ALT: 13 U/L (ref 0–35)
AST: 18 U/L (ref 0–37)
Albumin: 4.7 g/dL (ref 3.5–5.2)
Alkaline Phosphatase: 38 U/L — ABNORMAL LOW (ref 39–117)
BUN: 13 mg/dL (ref 6–23)
CO2: 29 meq/L (ref 19–32)
Calcium: 9.8 mg/dL (ref 8.4–10.5)
Chloride: 102 meq/L (ref 96–112)
Creatinine, Ser: 0.88 mg/dL (ref 0.40–1.20)
GFR: 88.31 mL/min (ref >60.00)
Glucose, Bld: 84 mg/dL (ref 70–99)
Potassium: 3.8 meq/L (ref 3.5–5.1)
Sodium: 139 meq/L (ref 135–145)
Total Bilirubin: 0.5 mg/dL (ref 0.2–1.2)
Total Protein: 7.5 g/dL (ref 6.0–8.3)

## 2022-12-08 LAB — CBC WITH DIFFERENTIAL/PLATELET
Basophils Absolute: 0 10*3/uL (ref 0.0–0.1)
Basophils Relative: 0.6 % (ref 0.0–3.0)
Eosinophils Absolute: 0.1 10*3/uL (ref 0.0–0.7)
Eosinophils Relative: 1.2 % (ref 0.0–5.0)
HCT: 40.5 % (ref 36.0–46.0)
Hemoglobin: 13.4 g/dL (ref 12.0–15.0)
Lymphocytes Relative: 30.8 % (ref 12.0–46.0)
Lymphs Abs: 1.4 10*3/uL (ref 0.7–4.0)
MCHC: 33 g/dL (ref 30.0–36.0)
MCV: 89.3 fL (ref 78.0–100.0)
Monocytes Absolute: 0.3 10*3/uL (ref 0.1–1.0)
Monocytes Relative: 6.4 % (ref 3.0–12.0)
Neutro Abs: 2.8 10*3/uL (ref 1.4–7.7)
Neutrophils Relative %: 61 % (ref 43.0–77.0)
Platelets: 263 10*3/uL (ref 150.0–400.0)
RBC: 4.53 Mil/uL (ref 3.87–5.11)
RDW: 12.8 % (ref 11.5–15.5)
WBC: 4.6 10*3/uL (ref 4.0–10.5)

## 2022-12-08 LAB — LIPID PANEL
Cholesterol: 207 mg/dL — ABNORMAL HIGH (ref 0–200)
HDL: 82.5 mg/dL (ref >39.00)
LDL Cholesterol: 113 mg/dL — ABNORMAL HIGH (ref 0–99)
NonHDL: 124.42
Total CHOL/HDL Ratio: 3
Triglycerides: 57 mg/dL (ref 0.0–149.0)
VLDL: 11.4 mg/dL (ref 0.0–40.0)

## 2022-12-08 LAB — TSH: TSH: 2.68 u[IU]/mL (ref 0.35–5.50)

## 2022-12-08 MED ORDER — NITROFURANTOIN MONOHYD MACRO 100 MG PO CAPS
100.0000 mg | ORAL_CAPSULE | Freq: Every day | ORAL | 2 refills | Status: DC | PRN
Start: 2022-12-08 — End: 2023-02-13
  Filled 2022-12-08: qty 30, 30d supply, fill #0

## 2022-12-08 NOTE — Progress Notes (Signed)
Subjective:    Patient ID: Debbie [name], female    DOB: [birth year], 32 y.o.   MRN: [id number]  Chief Complaint  Patient presents with   Annual Exam    Fasting.   burning with urination    Pt would like to be tested for STD has new sexual partner.    HPI Patient is in today for annual exam.  Acute concerns: Check for STDs, no symptoms   Health maintenance: Lifestyle/ exercise: Hiking, bike-riding Nutrition: Vegan diet mostly, doing well Mental health: Doing well, exercise and being outdoors helps Caffeine: Coffee daily  Sleep: Trazodone 100 mg every night helps  Substance use: None ETOH: Occasional  Sexual activity: Active, new female partner  Immunizations: UTD  Colonoscopy: Low-risk, will start at age 32  Pap: UTD with GYN  Skin: no new concerns - follows with dermatology    Past Medical History:  Diagnosis Date   Anxiety    Depression    Lupus    Sjogren's syndrome (HCC)     Past Surgical History:  Procedure Laterality Date   CYSTOSCOPY      Family History  Problem Relation Age of Onset   Thyroid disease Mother    Cancer Maternal Grandmother    Cancer Paternal Grandfather    Hashimoto's thyroiditis Sister    Thyroid disease Sister    Thyroid disease Brother     Social History   Tobacco Use   Smoking status: Former    Types: Cigarettes   Smokeless tobacco: Never   Tobacco comments:    Very rarely, social smoker  Vaping Use   Vaping status: Some Days  Substance Use Topics   Alcohol use: Yes    Comment: Rarely   Drug use: Yes    Types: Marijuana     Allergies  Allergen Reactions   Latex Rash   Sulfamethoxazole-Trimethoprim Rash and Swelling    Review of Systems NEGATIVE UNLESS OTHERWISE INDICATED IN HPI      Objective:     BP 100/64   Pulse 60   Temp (!) 97.5 F (36.4 C)   Ht 5\' 6"  (1.676 m)   Wt 147 lb 3.2 oz (66.8 kg)   SpO2 98%   BMI 23.76 kg/m   Wt Readings from Last 3 Encounters:  12/08/22 147 lb 3.2 oz (66.8  kg)  11/17/22 143 lb (64.9 kg)  08/04/22 144 lb (65.3 kg)    BP Readings from Last 3 Encounters:  12/08/22 100/64  11/17/22 100/64  08/04/22 119/70     Physical Exam Vitals and nursing note reviewed.  Constitutional:      Appearance: Normal appearance. She is normal weight. She is not toxic-appearing.  HENT:     Head: Normocephalic and atraumatic.     Right Ear: Tympanic membrane, ear canal and external ear normal.     Left Ear: Tympanic membrane, ear canal and external ear normal.     Nose: Nose normal.     Mouth/Throat:     Mouth: Mucous membranes are moist.  Eyes:     Extraocular Movements: Extraocular movements intact.     Conjunctiva/sclera: Conjunctivae normal.     Pupils: Pupils are equal, round, and reactive to light.  Cardiovascular:     Rate and Rhythm: Normal rate and regular rhythm.     Pulses: Normal pulses.     Heart sounds: Normal heart sounds.  Pulmonary:     Effort: Pulmonary effort is normal.     Breath sounds: Normal breath sounds.  Abdominal:     General: Abdomen is flat. Bowel sounds are normal.     Palpations: Abdomen is soft.  Musculoskeletal:        General: Normal range of motion.     Cervical back: Normal range of motion and neck supple.  Skin:    General: Skin is warm and dry.  Neurological:     General: No focal deficit present.     Mental Status: She is alert and oriented to person, place, and time.  Psychiatric:        Mood and Affect: Mood normal.        Behavior: Behavior normal.        Thought Content: Thought content normal.        Judgment: Judgment normal.        Assessment & Plan:  Encounter for annual physical exam -     CBC with Differential/Platelet -     Comprehensive metabolic panel -     Lipid panel -     TSH  Recurrent UTI -     Nitrofurantoin Monohyd Macro; Take 1 capsule (100 mg total) by mouth daily as needed after intercourse.  Dispense: 30 capsule; Refill: 2  Screen for STD (sexually transmitted  disease) -     Urine cytology ancillary only -     HIV Antibody (routine testing w rflx) -     RPR   Age-appropriate screening and counseling performed today. Will check labs and call with results. Preventive measures discussed and printed in AVS for patient.   Patient Counseling: [x]   Nutrition: Stressed importance of moderation in sodium/caffeine intake, saturated fat and cholesterol, caloric balance, sufficient intake of fresh fruits, vegetables, and fiber.  [x]   Stressed the importance of regular exercise.   [x]   Substance Abuse: Discussed cessation/primary prevention of tobacco, alcohol, or other drug use; driving or other dangerous activities under the influence; availability of treatment for abuse.   []   Injury prevention: Discussed safety belts, safety helmets, smoke detector, smoking near bedding or upholstery.   [x]   Sexuality: Discussed sexually transmitted diseases, partner selection, use of condoms, avoidance of unintended pregnancy  and contraceptive alternatives.   [x]   Dental health: Discussed importance of regular tooth brushing, flossing, and dental visits.  [x]   Health maintenance and immunizations reviewed. Please refer to Health maintenance section.      F/up 1 year or prn    Brinleigh Tew M Tymia Streb, PA-C

## 2022-12-09 LAB — HIV ANTIBODY (ROUTINE TESTING W REFLEX): HIV 1&2 Ab, 4th Generation: NONREACTIVE

## 2022-12-09 LAB — RPR: RPR Ser Ql: NONREACTIVE

## 2022-12-12 LAB — URINE CYTOLOGY ANCILLARY ONLY
Chlamydia: NEGATIVE
Comment: NEGATIVE
Comment: NEGATIVE
Comment: NORMAL
Neisseria Gonorrhea: NEGATIVE
Trichomonas: NEGATIVE

## 2023-01-09 IMAGING — DX DG CHEST 2V
2 series · 2 of 2 positions shown · non-contrast
Comparison: None.

CLINICAL DATA: Chest pain

EXAM:
CHEST - 2 VIEW

[chest pa]
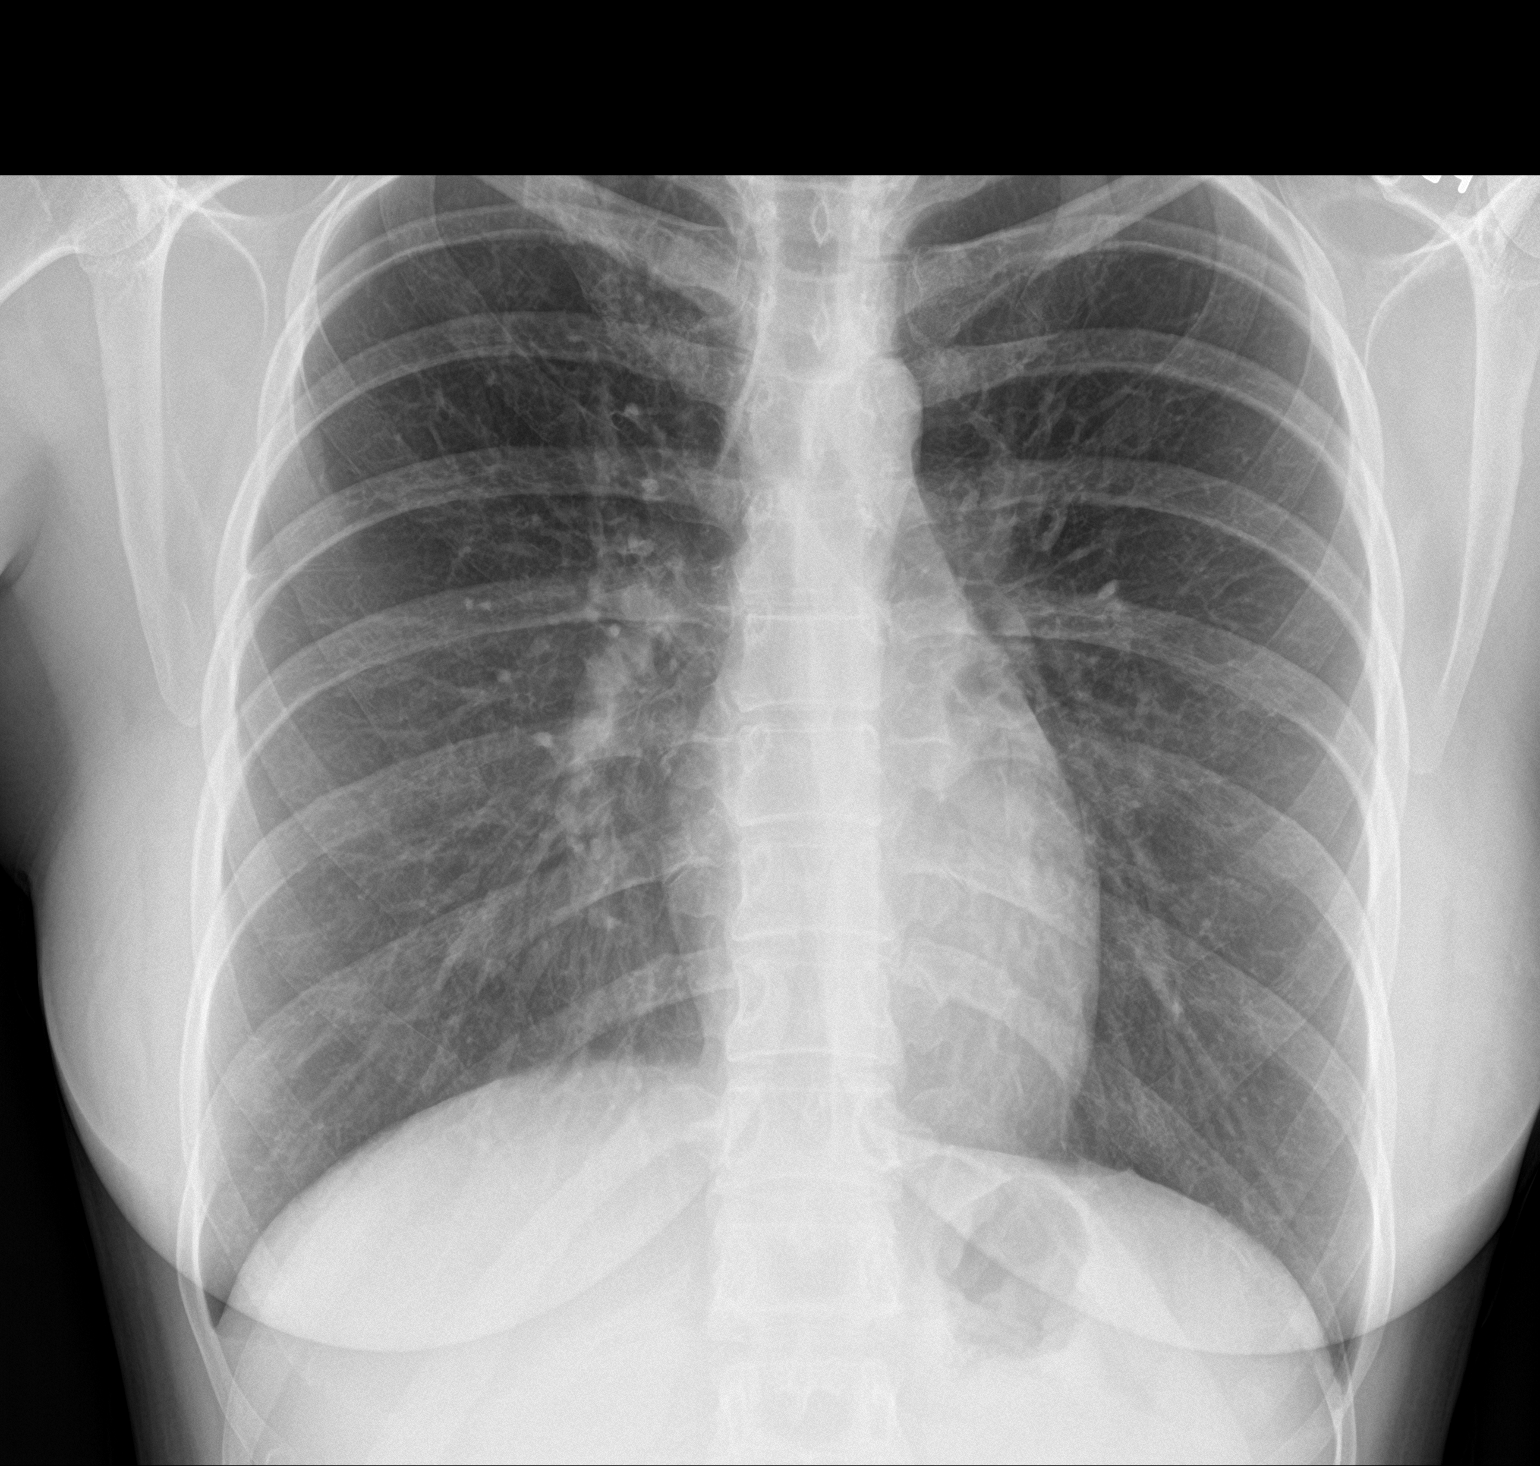

[chest lat]
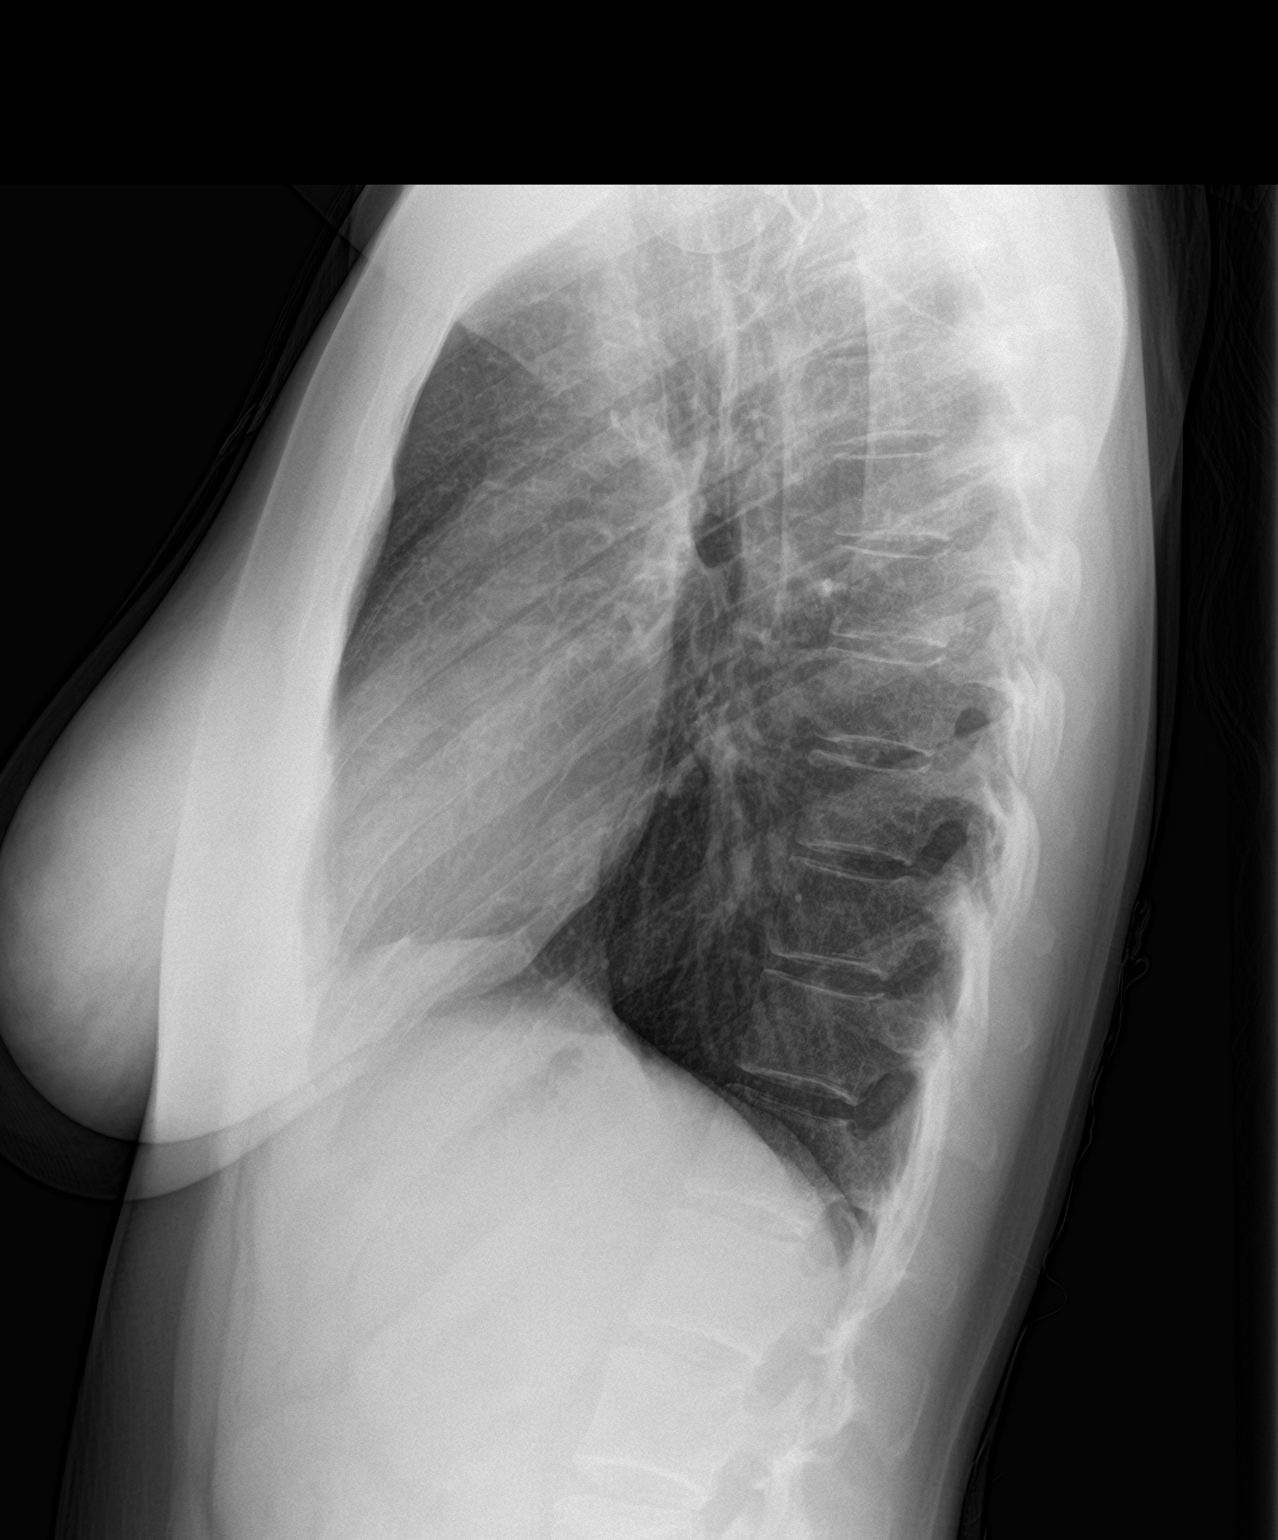

[2 of 2 positions shown; findings below may reference images not displayed]

FINDINGS: Heart size and mediastinal contours are within normal limits. No
suspicious pulmonary opacities identified.

No pleural effusion or pneumothorax visualized.

No acute osseous abnormality appreciated.
IMPRESSION: No acute intrathoracic process identified.

## 2023-01-10 ENCOUNTER — Encounter (INDEPENDENT_AMBULATORY_CARE_PROVIDER_SITE_OTHER): Payer: Self-pay | Admitting: *Deleted

## 2023-01-24 ENCOUNTER — Other Ambulatory Visit: Payer: Self-pay | Admitting: Obstetrics and Gynecology

## 2023-01-24 ENCOUNTER — Ambulatory Visit: Payer: 59 | Admitting: Dermatology

## 2023-01-24 VITALS — BP 106/69

## 2023-01-24 DIAGNOSIS — L74519 Primary focal hyperhidrosis, unspecified: Secondary | ICD-10-CM

## 2023-01-24 DIAGNOSIS — L750 Bromhidrosis: Secondary | ICD-10-CM | POA: Diagnosis not present

## 2023-01-24 DIAGNOSIS — R61 Generalized hyperhidrosis: Secondary | ICD-10-CM

## 2023-01-24 DIAGNOSIS — Z302 Encounter for sterilization: Secondary | ICD-10-CM

## 2023-01-24 MED ORDER — QBREXZA 2.4 % EX PADS: 1.0000 | MEDICATED_PAD | Freq: Every day | CUTANEOUS | 3 refills | Status: DC

## 2023-01-24 NOTE — Progress Notes (Signed)
   New Patient Visit   Subjective  Debbie [name] is a 32 y.o. 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 She goes through a lot of shoes due to sweating. She tried iontophoresis when she was younger. It made her hands cracked and dry and the sweating was the same or worse. She has tried OTC antiperspirants and prescription strength aluminum chloride. She would like to discuss Botox - if she has to choose an area to treat with Botox, she prefers treating her feet.    The following portions of the chart were reviewed this encounter and updated as appropriate: medications, allergies, medical history  Review of Systems:  No other skin or systemic complaints except as noted in HPI or Assessment and Plan.  Objective  Well appearing patient in no apparent distress; mood and affect are within normal limits.   A focused examination was performed of the following areas:   Relevant exam findings are noted in the Assessment and Plan.    Assessment & Plan   1. Hyperhidrosis (Axillary, Palmar, and Plantar) - Assessment: Patient presents with worsening hyperhidrosis affecting hands, feet, and armpits over the past year. Ineffectiveness of previous iontophoresis treatment noted, leading to skin dryness and cracking. No history of using glycopyrrolate, Qbrexza, CertainDry, or Drysol. Reports functional impairment and difficulty touching objects. - Plan: Prescribe Qbrexza (glycopyrronium) wipes for topical application. Instructions include application every other night before bed to hands, underarms, and feet. Monitor for side effects such as dry mouth or urinary retention. Refer to [facility] pharmacy for prescription processing and potential cost reduction. Schedule follow-up appointment in 8 weeks. Consider oral glycopyrrolate or Botox as alternative treatments if Debbie [name] is ineffective.  2.  Bromhidrosis (Feet) - Assessment: Patient reports foot odor causing rapid shoe deterioration. - Plan: Recommend daily use of benzoyl peroxide wash in shower. Provide samples of CeraVe benzoyl peroxide wash. Advise patient to purchase over-the-counter benzoyl peroxide wash for continued use.   No follow-ups on file.  I, Joanie Coddington, CMA, am acting as scribe for [employer], DO .   Documentation: I have reviewed the above documentation for accuracy and completeness, and I agree with the above.  Langston Reusing, DO

## 2023-01-24 NOTE — Patient Instructions (Signed)
Hello Debbie [name],  Thank you for visiting my office today.   Here is a summary of the key instructions and recommendations from today's consultation:  - Qbrexza Wipes: Begin using glycopyrronium wipes on your hands, underarms, and feet every other night before bed.   - Side Effects: Monitor for symptoms like dry mouth or urinary retention. Adjust usage as necessary based on these effects.  - Pharmacy Coordination: Your prescription will be sent to [facility]. They will process your insurance and mail the medication to you.  - Follow-Up Appointment: Please return in eight weeks so we can evaluate the effectiveness of the treatment. Depending on the outcome, we may consider oral medication or Botox as additional treatment options.  - Foot Care: Utilize CeraVe benzoyl peroxide wash daily in the shower. This will help reduce bacteria and address odor issues related to bromhidrosis. Samples were provided today, and more can be purchased over the counter.  - Potential Oral Medication: Should the topical treatment prove ineffective, we may consider starting oral glycopyrrolate at 1 mg, adjusting the dosage based on your response and tolerance.  I am eager to see your progress and am here to support you at every step. If you have any questions or concerns before our next meeting, please do not hesitate to reach out.  Warm regards,  Dr. Langston Reusing,  Dermatology

## 2023-02-08 ENCOUNTER — Encounter (HOSPITAL_BASED_OUTPATIENT_CLINIC_OR_DEPARTMENT_OTHER): Payer: Self-pay | Admitting: Obstetrics and Gynecology

## 2023-02-08 ENCOUNTER — Ambulatory Visit: Payer: Self-pay | Admitting: Physician Assistant

## 2023-02-08 NOTE — Progress Notes (Signed)
Spoke w/ via phone for pre-op interview--- Debbie [name] needs dos---- CBC, T&S and UPT per surgeon.        [name] results------ COVID test -----patient states asymptomatic no test needed Arrive at -------0530 NPO after MN NO Solid Food.   Med rec completed Medications to take morning of surgery -----NONE Diabetic medication ----- Patient instructed no nail polish to be worn day of surgery Patient instructed to bring photo id and insurance card day of surgery Patient aware to have Driver (ride ) / caregiver    for 24 hours after surgery - Mother Debbie [name] Patient Special Instructions ----- Pre-Op special Instructions ----- Patient verbalized understanding of instructions that were given at this phone interview. Patient denies chest pain, sob, fever, cough at the interview.

## 2023-02-08 NOTE — Telephone Encounter (Signed)
Chief Complaint: Abdominal pain Symptoms: Abdominal pain in middle of abdomen Frequency: intermittent, several days a week, has been occurring for almost a year but worsening in frequency. Pertinent Negatives: Patient denies NVD, blood in stool, difficulty urinating, back pain, chest pain, flank pain Disposition: [] ED /[x] Urgent Care (no appt availability in office) / [] Appointment(In office/virtual)/ []  [place] [place] [facility]/ [] Home Care/ [] Refused Recommended Disposition /[] [facility]/ []  Follow-up with PCP Additional Notes: Pt called stating she has had centralized non radiating abdominal pain intermittently over the last year, but has been worsening in frequency and pain is intolerable over the last week. States that pain is currently 1/10, but worse at night and crippling. States pain increases immediately after eating as well. Pt reports that pain is relieved with pressure to abdomen. Pt has IUD in place, last bowel movement yesterday x2, described as normal. Denies NVD, back pain, flank pain, difficulty urinating, blood in stool. Per protocol, pt to be evaluated within 4 hours. Next available with PCP 12/24 @0900 . Upgraded to urgent care, pt agreeable to plan. States she will walk in to urgent care. Care instructions reviewed with pt, verbalized understanding and denies further questions at this time. Alerting PCP for review.   Copied from CRM [id number]. Topic: Clinical - Red Word Triage >> Feb 08, 2023  2:20 PM Prudencio Pair wrote: Red Word that prompted transfer to Nurse Triage: Patient calling stating that she's been having GI issues. States that she's having a lot of pain in her abdomen and the pain keeps her up at night. Rated pain a 10 sometimes. States no nausea/vomiting. Pain is in the middle area of abdomen. Looking to schedule with pcp. Reason for Disposition  [1] MILD-MODERATE pain AND [2] constant AND [3] present > 2 hours  Answer Assessment - Initial Assessment  Questions 1. LOCATION: "Where does it hurt?"      Center of abdomen 2. RADIATION: "Does the pain shoot anywhere else?" (e.g., chest, back)     Non radiating 3. ONSET: "When did the pain begin?" (e.g., minutes, hours or days ago)      Has had this occasionally over the last few years.But sporadic, but now approx 3 days a week. Last week 3 days this week three. 4. SUDDEN: "Gradual or sudden onset?"     Gradual when beginning 5. PATTERN "Does the pain come and go, or is it constant?"    - If it comes and goes: "How long does it last?" "Do you have pain now?"     (Note: Comes and goes means the pain is intermittent. It goes away completely between bouts.)    - If constant: "Is it getting better, staying the same, or getting worse?"      (Note: Constant means the pain never goes away completely; most serious pain is constant and gets worse.)      Comes and goes, worse in evening, worse after eating. 6. SEVERITY: "How bad is the pain?"  (e.g., Scale 1-10; mild, moderate, or severe)    - MILD (1-3): Doesn't interfere with normal activities, abdomen soft and not tender to touch.     - MODERATE (4-7): Interferes with normal activities or awakens from sleep, abdomen tender to touch.     - SEVERE (8-10): Excruciating pain, doubled over, unable to do any normal activities.       1/10 now 7. RECURRENT SYMPTOM: "Have you ever had this type of stomach pain before?" If Yes, ask: "When was the last time?" and "What  happened that time?"      Yes, did not have a specific eval 8. CAUSE: "What do you think is causing the stomach pain?"     No idea 9. RELIEVING/AGGRAVATING FACTORS: "What makes it better or worse?" (e.g., antacids, bending or twisting motion, bowel movement)     Sometimes relieved with pressure on abdomen, medications have not helped 10. OTHER SYMPTOMS: "Do you have any other symptoms?" (e.g., back pain, diarrhea, fever, urination pain, vomiting)       Denies 11. PREGNANCY: "Is there any chance  you are pregnant?" "When was your last menstrual period?"       Denies, IUD in place  Protocols used: Abdominal Pain - [facility]

## 2023-02-08 NOTE — Telephone Encounter (Signed)
Please see pt triage note as FYI

## 2023-02-08 NOTE — Telephone Encounter (Signed)
Debbie [name], see message.

## 2023-02-09 NOTE — Telephone Encounter (Signed)
Spoke with pt - pt states that she did not go to Urgent Care due to feeling better, Pt expressed clear understanding that if she starts to have pain again that she would need to go to a Urgent Care to be seen.

## 2023-02-10 ENCOUNTER — Encounter (HOSPITAL_BASED_OUTPATIENT_CLINIC_OR_DEPARTMENT_OTHER): Payer: Self-pay | Admitting: Obstetrics and Gynecology

## 2023-02-10 NOTE — Anesthesia Preprocedure Evaluation (Signed)
Anesthesia Evaluation  Patient identified by MRN, date of birth, ID band Patient awake    Reviewed: Allergy & Precautions, NPO status , Patient's Chart, lab work & pertinent test results  Airway Mallampati: II       Dental no notable dental hx. (+) Dental Advisory Given   Pulmonary Current Smoker and Patient abstained from smoking.   Pulmonary exam normal breath sounds clear to auscultation       Cardiovascular negative cardio ROS Normal cardiovascular exam Rhythm:Regular Rate:Normal     Neuro/Psych  PSYCHIATRIC DISORDERS Anxiety Depression    negative neurological ROS     GI/Hepatic negative GI ROS, Neg liver ROS,,,  Endo/Other  negative endocrine ROS    Renal/GU negative Renal ROS     Musculoskeletal SLE Sjogren's syndrome   Abdominal   Peds  Hematology negative hematology ROS (+)   Anesthesia Other Findings   Reproductive/Obstetrics Undesired fertility                              Anesthesia Physical Anesthesia Plan  ASA: 2  Anesthesia Plan: General   Post-op Pain Management: Dilaudid IV, Tylenol PO (pre-op)* and Precedex   Induction: Intravenous  PONV Risk Score and Plan: 4 or greater and Treatment may vary due to age or medical condition, Midazolam, Dexamethasone and Ondansetron  Airway Management Planned: Oral ETT  Additional Equipment: None  Intra-op Plan:   Post-operative Plan: Extubation in OR  Informed Consent: I have reviewed the patients History and Physical, chart, labs and discussed the procedure including the risks, benefits and alternatives for the proposed anesthesia with the patient or authorized representative who has indicated his/her understanding and acceptance.     Dental advisory given  Plan Discussed with: CRNA and Anesthesiologist  Anesthesia Plan Comments:         Anesthesia Quick Evaluation

## 2023-02-13 ENCOUNTER — Ambulatory Visit (HOSPITAL_BASED_OUTPATIENT_CLINIC_OR_DEPARTMENT_OTHER)
Admission: RE | Admit: 2023-02-13 | Discharge: 2023-02-13 | Disposition: A | Discharge status: Home / Self Care | Payer: 59 | Attending: Obstetrics and Gynecology | Admitting: Obstetrics and Gynecology

## 2023-02-13 ENCOUNTER — Encounter (HOSPITAL_BASED_OUTPATIENT_CLINIC_OR_DEPARTMENT_OTHER)
Admission: RE | Disposition: A | Discharge status: Home / Self Care | Payer: Self-pay | Source: Home / Self Care | Attending: Obstetrics and Gynecology

## 2023-02-13 ENCOUNTER — Other Ambulatory Visit (HOSPITAL_COMMUNITY): Payer: Self-pay

## 2023-02-13 ENCOUNTER — Other Ambulatory Visit: Payer: Self-pay | Admitting: Obstetrics and Gynecology

## 2023-02-13 ENCOUNTER — Ambulatory Visit (HOSPITAL_BASED_OUTPATIENT_CLINIC_OR_DEPARTMENT_OTHER): Payer: Self-pay | Admitting: Anesthesiology

## 2023-02-13 ENCOUNTER — Encounter (HOSPITAL_BASED_OUTPATIENT_CLINIC_OR_DEPARTMENT_OTHER): Payer: Self-pay | Admitting: Obstetrics and Gynecology

## 2023-02-13 ENCOUNTER — Other Ambulatory Visit: Payer: Self-pay

## 2023-02-13 DIAGNOSIS — Z302 Encounter for sterilization: Secondary | ICD-10-CM

## 2023-02-13 DIAGNOSIS — F17210 Nicotine dependence, cigarettes, uncomplicated: Secondary | ICD-10-CM | POA: Diagnosis not present

## 2023-02-13 DIAGNOSIS — Z640 Problems related to unwanted pregnancy: Secondary | ICD-10-CM | POA: Diagnosis not present

## 2023-02-13 DIAGNOSIS — M329 Systemic lupus erythematosus, unspecified: Secondary | ICD-10-CM | POA: Insufficient documentation

## 2023-02-13 DIAGNOSIS — M3500 Sicca syndrome, unspecified: Secondary | ICD-10-CM | POA: Diagnosis not present

## 2023-02-13 HISTORY — PX: LAPAROSCOPIC BILATERAL SALPINGECTOMY: SHX5889

## 2023-02-13 LAB — ABO/RH: ABO/RH(D): A NEG

## 2023-02-13 LAB — TYPE AND SCREEN
ABO/RH(D): A NEG
Antibody Screen: NEGATIVE

## 2023-02-13 LAB — CBC
HCT: 44 % (ref 36.0–46.0)
Hemoglobin: 14.5 g/dL (ref 12.0–15.0)
MCH: 29.6 pg (ref 26.0–34.0)
MCHC: 33 g/dL (ref 30.0–36.0)
MCV: 89.8 fL (ref 80.0–100.0)
Platelets: 240 10*3/uL (ref 150–400)
RBC: 4.9 MIL/uL (ref 3.87–5.11)
RDW: 12 % (ref 11.5–15.5)
WBC: 5.3 10*3/uL (ref 4.0–10.5)
nRBC: 0 % (ref 0.0–0.2)

## 2023-02-13 LAB — POCT PREGNANCY, URINE: Preg Test, Ur: NEGATIVE

## 2023-02-13 SURGERY — SALPINGECTOMY, BILATERAL, LAPAROSCOPIC
Anesthesia: General | Site: Abdomen

## 2023-02-13 MED ORDER — LIDOCAINE 2% (20 MG/ML) 5 ML SYRINGE
INTRAMUSCULAR | Status: DC | PRN
  Administered 2023-02-13: 60 mg via INTRAVENOUS

## 2023-02-13 MED ORDER — FENTANYL CITRATE (PF) 100 MCG/2ML IJ SOLN
INTRAMUSCULAR | Status: DC | PRN
  Administered 2023-02-13 (×4): 50 ug via INTRAVENOUS

## 2023-02-13 MED ORDER — PROPOFOL 10 MG/ML IV BOLUS
INTRAVENOUS | Status: AC
  Filled 2023-02-13: qty 20

## 2023-02-13 MED ORDER — DEXAMETHASONE SODIUM PHOSPHATE 10 MG/ML IJ SOLN
INTRAMUSCULAR | Status: AC
  Filled 2023-02-13: qty 1

## 2023-02-13 MED ORDER — ACETAMINOPHEN 500 MG PO TABS
1000.0000 mg | ORAL_TABLET | ORAL | Status: AC
  Administered 2023-02-13: 1000 mg via ORAL

## 2023-02-13 MED ORDER — ROCURONIUM BROMIDE 10 MG/ML (PF) SYRINGE
PREFILLED_SYRINGE | INTRAVENOUS | Status: DC | PRN
  Administered 2023-02-13: 60 mg via INTRAVENOUS

## 2023-02-13 MED ORDER — SODIUM CHLORIDE 0.9 % IV SOLN
INTRAVENOUS | Status: DC
Start: 2023-02-13 — End: 2023-02-13

## 2023-02-13 MED ORDER — OXYCODONE HCL 5 MG PO TABS: 5.0000 mg | ORAL_TABLET | Freq: Once | ORAL | Status: DC | PRN

## 2023-02-13 MED ORDER — FENTANYL CITRATE (PF) 100 MCG/2ML IJ SOLN
INTRAMUSCULAR | Status: AC
  Filled 2023-02-13: qty 2

## 2023-02-13 MED ORDER — GLYCOPYRROLATE PF 0.2 MG/ML IJ SOSY
PREFILLED_SYRINGE | INTRAMUSCULAR | Status: AC
  Filled 2023-02-13: qty 1

## 2023-02-13 MED ORDER — ONDANSETRON HCL 4 MG/2ML IJ SOLN
INTRAMUSCULAR | Status: AC
  Filled 2023-02-13: qty 2

## 2023-02-13 MED ORDER — SENNOSIDES-DOCUSATE SODIUM 8.6-50 MG PO TABS
2.0000 | ORAL_TABLET | Freq: Every day | ORAL | 0 refills | Status: DC
  Filled 2023-02-13: qty 60, 30d supply, fill #0

## 2023-02-13 MED ORDER — SUGAMMADEX SODIUM 200 MG/2ML IV SOLN
INTRAVENOUS | Status: DC | PRN
  Administered 2023-02-13: 150 mg via INTRAVENOUS

## 2023-02-13 MED ORDER — IBUPROFEN 800 MG PO TABS
800.0000 mg | ORAL_TABLET | Freq: Three times a day (TID) | ORAL | 0 refills | Status: DC
  Filled 2023-02-13: qty 60, 20d supply, fill #0

## 2023-02-13 MED ORDER — BUPIVACAINE HCL (PF) 0.25 % IJ SOLN
INTRAMUSCULAR | Status: DC | PRN
  Administered 2023-02-13: 5.5 mL

## 2023-02-13 MED ORDER — OXYCODONE HCL 5 MG PO TABS
5.0000 mg | ORAL_TABLET | ORAL | 0 refills | Status: DC | PRN
  Filled 2023-02-13: qty 10, 2d supply, fill #0

## 2023-02-13 MED ORDER — HYDROMORPHONE HCL 1 MG/ML IJ SOLN: 0.2500 mg | INTRAMUSCULAR | Status: DC | PRN

## 2023-02-13 MED ORDER — OXYCODONE HCL 5 MG/5ML PO SOLN
5.0000 mg | Freq: Once | ORAL | Status: DC | PRN
Start: 2023-02-13 — End: 2023-02-13

## 2023-02-13 MED ORDER — KETOROLAC TROMETHAMINE 30 MG/ML IJ SOLN
INTRAMUSCULAR | Status: AC
  Filled 2023-02-13: qty 1

## 2023-02-13 MED ORDER — MIDAZOLAM HCL 2 MG/2ML IJ SOLN
INTRAMUSCULAR | Status: AC
  Filled 2023-02-13: qty 2

## 2023-02-13 MED ORDER — LIDOCAINE HCL (PF) 2 % IJ SOLN
INTRAMUSCULAR | Status: AC
  Filled 2023-02-13: qty 5

## 2023-02-13 MED ORDER — DROPERIDOL 2.5 MG/ML IJ SOLN: 0.6250 mg | Freq: Once | INTRAMUSCULAR | Status: DC | PRN

## 2023-02-13 MED ORDER — DEXAMETHASONE SODIUM PHOSPHATE 10 MG/ML IJ SOLN
INTRAMUSCULAR | Status: DC | PRN
  Administered 2023-02-13 (×2): 5 mg via INTRAVENOUS

## 2023-02-13 MED ORDER — PROPOFOL 10 MG/ML IV BOLUS
INTRAVENOUS | Status: DC | PRN
  Administered 2023-02-13: 200 mg via INTRAVENOUS

## 2023-02-13 MED ORDER — LACTATED RINGERS IV SOLN: INTRAVENOUS | Status: DC

## 2023-02-13 MED ORDER — KETOROLAC TROMETHAMINE 30 MG/ML IJ SOLN
INTRAMUSCULAR | Status: DC | PRN
  Administered 2023-02-13: 30 mg via INTRAVENOUS

## 2023-02-13 MED ORDER — GLYCOPYRROLATE PF 0.2 MG/ML IJ SOSY
PREFILLED_SYRINGE | INTRAMUSCULAR | Status: DC | PRN
  Administered 2023-02-13: .2 mg via INTRAVENOUS

## 2023-02-13 MED ORDER — DEXMEDETOMIDINE HCL IN NACL 80 MCG/20ML IV SOLN
INTRAVENOUS | Status: DC | PRN
  Administered 2023-02-13: 12 ug via INTRAVENOUS

## 2023-02-13 MED ORDER — WHITE PETROLATUM EX OINT
TOPICAL_OINTMENT | CUTANEOUS | Status: AC
  Filled 2023-02-13: qty 5

## 2023-02-13 MED ORDER — ONDANSETRON HCL 4 MG/2ML IJ SOLN
INTRAMUSCULAR | Status: DC | PRN
  Administered 2023-02-13: 4 mg via INTRAVENOUS

## 2023-02-13 MED ORDER — ACETAMINOPHEN 500 MG PO TABS
ORAL_TABLET | ORAL | Status: AC
  Filled 2023-02-13: qty 2

## 2023-02-13 MED ORDER — MIDAZOLAM HCL 2 MG/2ML IJ SOLN
INTRAMUSCULAR | Status: DC | PRN
  Administered 2023-02-13: 2 mg via INTRAVENOUS

## 2023-02-13 MED ORDER — ACETAMINOPHEN 500 MG PO TABS
1000.0000 mg | ORAL_TABLET | Freq: Three times a day (TID) | ORAL | 0 refills | Status: DC
  Filled 2023-02-13: qty 60, 10d supply, fill #0

## 2023-02-13 MED ORDER — ROCURONIUM BROMIDE 10 MG/ML (PF) SYRINGE
PREFILLED_SYRINGE | INTRAVENOUS | Status: AC
  Filled 2023-02-13: qty 10

## 2023-02-13 MED ORDER — ONDANSETRON HCL 4 MG/2ML IJ SOLN: 4.0000 mg | Freq: Once | INTRAMUSCULAR | Status: DC | PRN

## 2023-02-13 SURGICAL SUPPLY — 31 items
BAG COUNTER SPONGE SURGICOUNT (BAG) ×1 IMPLANT
DERMABOND ADVANCED .7 DNX12 (GAUZE/BANDAGES/DRESSINGS) ×1 IMPLANT
DRAPE SURG IRRIG POUCH 19X23 (DRAPES) ×1 IMPLANT
DURAPREP 26ML APPLICATOR (WOUND CARE) ×1 IMPLANT
GLOVE BIO SURGEON STRL SZ7 (GLOVE) ×2 IMPLANT
GLOVE BIOGEL PI IND STRL 7.0 (GLOVE) ×2 IMPLANT
GOWN STRL REUS W/TWL XL LVL3 (GOWN DISPOSABLE) ×3 IMPLANT
IRRIG SUCT STRYKERFLOW 2 WTIP (MISCELLANEOUS) IMPLANT
IRRIGATION SUCT STRKRFLW 2 WTP (MISCELLANEOUS) ×1 IMPLANT
KIT PINK PAD W/HEAD ARE REST (MISCELLANEOUS) ×1 IMPLANT
KIT PINK PAD W/HEAD ARM REST (MISCELLANEOUS) ×1 IMPLANT
KIT TURNOVER CYSTO (KITS) ×1 IMPLANT
LIGASURE VESSEL 5MM BLUNT TIP (ELECTROSURGICAL) IMPLANT
NDL INSUFFLATION 14GA 120MM (NEEDLE) ×1 IMPLANT
NEEDLE INSUFFLATION 14GA 120MM (NEEDLE) ×1 IMPLANT
PACK LAPAROSCOPY BASIN (CUSTOM PROCEDURE TRAY) ×1 IMPLANT
POUCH LAPAROSCOPIC INSTRUMENT (MISCELLANEOUS) ×1 IMPLANT
PROTECTOR NERVE ULNAR (MISCELLANEOUS) ×2 IMPLANT
SEALER TISSUE G2 CVD JAW 35 (ENDOMECHANICALS) IMPLANT
SLEEVE ADV FIXATION 5X100MM (TROCAR) IMPLANT
SUT VIC AB 4-0 PS2 27 (SUTURE) ×1 IMPLANT
SUT VICRYL 0 UR6 27IN ABS (SUTURE) IMPLANT
SYR 30ML LL (SYRINGE) IMPLANT
SYS BAG RETRIEVAL 10MM (BASKET) IMPLANT
SYSTEM BAG RETRIEVAL 10MM (BASKET) IMPLANT
SYSTEM CARTER THOMASON II (TROCAR) IMPLANT
TOWEL GREEN STERILE FF (TOWEL DISPOSABLE) ×2 IMPLANT
TRAY FOLEY W/BAG SLVR 14FR LF (SET/KITS/TRAYS/PACK) ×1 IMPLANT
TROCAR ADV FIXATION 5X100MM (TROCAR) ×1 IMPLANT
TROCAR BALLN 12MMX100 BLUNT (TROCAR) IMPLANT
WARMER LAPAROSCOPE (MISCELLANEOUS) ×1 IMPLANT

## 2023-02-13 NOTE — Op Note (Signed)
Debbie [name] PROCEDURE DATE: 02/13/2023   PREOPERATIVE DIAGNOSIS:  Undesired fertility  POSTOPERATIVE DIAGNOSIS:  Undesired fertility  PROCEDURE:  Laparoscopic Bilateral Salpingectomy   SURGEON:  Dr. Harvie Bridge  ASSISTANT:  Dr. Westway Bing. An experienced assistant was required given the standard of surgical care given the complexity of the case.  This assistant was needed for exposure, dissection, suctioning, retraction, instrument exchange, and for overall help during the procedure.  ANESTHESIA:  General endotracheal  COMPLICATIONS:  None immediate.  ESTIMATED BLOOD LOSS:  5 ml.  FLUIDS: 700 ml LR.  URINE OUTPUT:  50 ml of clear urine.  INDICATIONS: 32 y.o. G0P0000 with undesired fertility, desires permanent sterilization.  Other forms of contraception were discussed with patient and emphasized alternatives of vasectomy, IUDs and Nexplanon as they have equivalent contraceptive efficacy; she declines all other modalities.  Risks of procedure discussed with patient including permanence of method, risk of regret, bleeding, infection, injury to surrounding organs and need for additional procedures including laparotomy.  Failure risk less than 0.5% with increased risk of ectopic gestation if pregnancy occurs was also discussed with patient.  Written informed consent was obtained.    FINDINGS:  Normal uterus, fallopian tubes, and ovaries.  TECHNIQUE:  After all consents were signed and questions were answered the patient was taken to the operating room where general anesthesia was induced. She was placed in dorsal lithotomy position. A catheter was used to drain her bladder. She was prepped and draped in the usual sterile fashion in the dorsal supine position. A timeout was performed.  5mm incision made in the umbilicus and optical entry was attempted and unsuccessful. Incision was extended to 10mm. Fascia was grasped, elevated and incised with the scalpel. Peritoneum was  entered bluntly with the S retractor. The fascia was tagged with 0-vicryl. The 10mm Hasson port was placed in the umbilicus. After confirmation of intraperitoneal entry, the abdomen was insufflated. The area under the point of entry was carefully inspected and no injury was noted. The patient was then placed in Trendelenburg position.  The scalpel was then used to make two 5mm incsions, one in the LLQ and one in the RLQ. 5mm optiview ports were inserted. The fallopian tubes were elevated, cauterized, cut, and detached from their surrounding pelvic structures with the Ligasure. No bleeding was noted. The specimens were withdrawn through the 5mm ports. The pelvis was inspected and was hemostatic. The 5mm ports were then withdrawn under direct visualization. The gas was drained from abdomen. The umbilical fascia was closed with 0-vicryl and the initial tags were removed. The skin was then closed in a subcuticular fashion with 4-0 vicryl and covered with Dermabond  The patient will be discharged to home as per PACU criteria.  Routine postoperative instructions given.  She will follow up in the office in 4 wks for postoperative evaluation.  Harvie Bridge, MD Obstetrician & Gynecologist, [facility] for [employer], [facility]

## 2023-02-13 NOTE — Anesthesia Postprocedure Evaluation (Signed)
Anesthesia Post Note  Patient: Debbie [name]  Procedure(s) Performed: LAPAROSCOPIC BILATERAL SALPINGECTOMY (Abdomen)     Patient location during evaluation: PACU Anesthesia Type: General Level of consciousness: awake and alert and oriented Pain management: pain level controlled Vital Signs Assessment: post-procedure vital signs reviewed and stable Respiratory status: spontaneous breathing, nonlabored ventilation and respiratory function stable Cardiovascular status: blood pressure returned to baseline and stable Postop Assessment: no apparent nausea or vomiting Anesthetic complications: no   No notable events documented.  Last Vitals:  Vitals:   02/13/23 0845 02/13/23 0900  BP: 118/78 121/78  Pulse: 77 75  Resp: 17 16  Temp:    SpO2: 100% 100%    Last Pain:  Vitals:   02/13/23 0900  TempSrc:   PainSc: 1                  Lunell Robart A.

## 2023-02-13 NOTE — Discharge Instructions (Addendum)

## 2023-02-13 NOTE — H&P (Addendum)
PRE OPERATIVE HISTORY & PHYSICAL  Subjective:  Debbie [name] is a 32 y.o. G0P0000 with IUD in place presenting for scheduled laparoscopic bilateral salpingectomy.   First met with patient 08/04/22 to discuss permanent sterilization.  Does not desire future fertility. Does not think she ever wants to have children and if she did, she would adopt/foster. Has concerns about ethics of bringing children into the world when many children in the world aren't cared for. She has a Liletta IUD in place that she is happy with, but she is worried about the political climate and if she will be able to get an IUD/good contraception in the future. Permanent sterilization would help alleviate those concerns.  Today, she reports she is feeling well and ready for surgery. She confirms her desire for permanent sterilization. She denies any changes to medical or surgical history.  Past Medical History:  Diagnosis Date   Anxiety    Depression    Lupus    Sjogren's syndrome (HCC)    Past Surgical History:  Procedure Laterality Date   CYSTOSCOPY     No current facility-administered medications on file prior to encounter.   Current Outpatient Medications on File Prior to Encounter  Medication Sig Dispense Refill   magnesium oxide (MAG-OX) 400 (240 Mg) MG tablet Take 400 mg by mouth daily.     traZODone (DESYREL) 50 MG tablet Take 1 tablet (50 mg total) by mouth at bedtime. 90 tablet 0   cholecalciferol (VITAMIN D3) 25 MCG (1000 UNIT) tablet Take 1,000 Units by mouth daily. (Patient not taking: Reported on 02/08/2023)     FLUoxetine (PROZAC) 40 MG capsule Take 1 capsule (40 mg total) by mouth daily. (Patient not taking: Reported on 02/08/2023) 90 capsule 0   lactobacillus acidophilus (BACID) TABS tablet Take 2 tablets by mouth daily. (Patient not taking: Reported on 02/08/2023)     levonorgestrel (LILETTA, 52 MG,) 20.1 MCG/DAY IUD IUD 1 each by Intrauterine route once.     spironolactone (ALDACTONE) 25  MG tablet Take 1 tablet by mouth 2 times daily. (Patient not taking: Reported on 02/08/2023) 180 tablet 0   Allergies  Allergen Reactions   Latex Rash   Sulfamethoxazole-Trimethoprim Rash and Swelling   OB History     Gravida  0   Para  0   Term  0   Preterm  0   AB  0   Living  0      SAB  0   IAB  0   Ectopic  0   Multiple  0   Live Births  0          Social History   Socioeconomic History   Marital status: Married    Spouse name: Not on file   Number of children: Not on file   Years of education: Not on file   Highest education level: Not on file  Occupational History   Not on file  Tobacco Use   Smoking status: Some Days    Types: Cigarettes   Smokeless tobacco: Never   Tobacco comments:    Very rarely, social smoker  Vaping Use   Vaping status: Some Days  Substance and Sexual Activity   Alcohol use: Yes    Comment: Rarely   Drug use: Not Currently    Types: Marijuana   Sexual activity: Yes    Birth control/protection: I.U.D.  Other Topics Concern   Not on file  Social History Narrative   Not on file  Social Drivers of Corporate investment banker Strain: Not on file  Food Insecurity: Not on file  Transportation Needs: Not on file  Physical Activity: Not on file  Stress: Not on file  Social Connections: Not on file  Intimate Partner Violence: Not on file   Objective:   Vitals:   02/08/23 1104 02/13/23 0600  BP:  125/71  Pulse:  73  Resp:  17  Temp:  98.1 F (36.7 C)  TempSrc:  Oral  SpO2:  98%  Weight: 65.8 kg 65.3 kg  Height: 5\' 6"  (1.676 m) 5\' 6"  (1.676 m)    General:  Alert, oriented and cooperative. Patient is in no acute distress.  Skin: Skin is warm and dry. No rash noted.   Cardiovascular: Normal heart rate noted  Respiratory: Normal respiratory effort, no problems with respiration noted  Abdomen: Soft, non-tender, non-distended    Assessment and Plan:  Debbie [name] is a 32 y.o. presenting for  bilateral salpingectomy for permanent sterilization  Request for sterilization - She desires permanent sterilization. Discussed alternatives including LARC options and vasectomy - reviewed that they are equally efficacious with lower risk and advantage of being reversible. She declines these options.  - Discussed surgery of salpingectomy due to higher contraceptive efficacy and potentially lower risk for ovarian cancer. She would like to proceed with salpingectomy - Risks of surgery include but are not limited to: bleeding, infection, injury to surrounding organs/tissues (i.e. bowel/bladder/ureters), need for additional procedures, wound complications, hospital re-admission, and conversion to open surgery, VTE. We reviewed risk of contraceptive failure and risk of regret.  - Reviewed restrictions and recovery following surgery  To OR when ready  Future Appointments  Date Time Provider Department Center  03/28/2023  2:15 PM Terri Piedra, DO [facility] None    Lennart Pall, MD

## 2023-02-13 NOTE — Transfer of Care (Signed)
Immediate Anesthesia Transfer of Care Note  Patient: Debbie [name]  Procedure(s) Performed: Procedure(s) (LRB): LAPAROSCOPIC BILATERAL SALPINGECTOMY (N/A)  Patient Location: PACU  Anesthesia Type: General  Level of Consciousness: awake, oriented, sedated and patient cooperative  Airway & Oxygen Therapy: Patient Spontanous Breathing and Patient connected to face mask oxygen  Post-op Assessment: Report given to PACU RN and Post -op Vital signs reviewed and stable  Post vital signs: Reviewed and stable  Complications: No apparent anesthesia complications  Last Vitals:  Vitals Value Taken Time  BP 113/81 02/13/23 0832  Temp 36.8 C 02/13/23 0833  Pulse 83 02/13/23 0834  Resp 24 02/13/23 0834  SpO2 100 % 02/13/23 0834  Vitals shown include unfiled device data.  Last Pain:  Vitals:   02/13/23 0600  TempSrc: Oral  PainSc: 0-No pain      Patients Stated Pain Goal: 8 (02/13/23 0600)  Complications: No notable events documented.

## 2023-02-13 NOTE — Anesthesia Procedure Notes (Signed)
Procedure Name: Intubation Date/Time: 02/13/2023 7:38 AM  Performed by: Francie Massing, CRNAPre-anesthesia Checklist: Patient identified, Emergency Drugs available, Suction available and Patient being monitored Patient Re-evaluated:Patient Re-evaluated prior to induction Oxygen Delivery Method: Circle system utilized Preoxygenation: Pre-oxygenation with 100% oxygen Induction Type: IV induction Ventilation: Mask ventilation without difficulty Laryngoscope Size: Mac and 3 Grade View: Grade I Tube type: Oral Tube size: 7.0 mm Number of attempts: 1 Airway Equipment and Method: Stylet and Oral airway Placement Confirmation: ETT inserted through vocal cords under direct vision, positive ETCO2 and breath sounds checked- equal and bilateral Secured at: 22 cm Tube secured with: Tape Dental Injury: Teeth and Oropharynx as per pre-operative assessment

## 2023-02-14 ENCOUNTER — Encounter (HOSPITAL_BASED_OUTPATIENT_CLINIC_OR_DEPARTMENT_OTHER): Payer: Self-pay | Admitting: Obstetrics and Gynecology

## 2023-02-14 LAB — SURGICAL PATHOLOGY

## 2023-02-19 ENCOUNTER — Encounter: Payer: Self-pay | Admitting: Obstetrics and Gynecology

## 2023-02-20 ENCOUNTER — Encounter: Payer: Self-pay | Admitting: *Deleted

## 2023-03-13 NOTE — Progress Notes (Unsigned)
   POST OPERATIVE GYNECOLOGY VISIT  Subjective:  Debbie [name] is a 33 y.o. G0P0000 4 weeks s/p lsc bilateral salpingectomy for permanent sterilization  Recovery is going ***  IUD in place to help keep periods light/short  Path & images reviewed: - path benign tubes - images unremarkable, show complete removal of both tubes  Objective:  There were no vitals filed for this visit.  General:  Alert, oriented and cooperative. Patient is in no acute distress.  Skin: Skin is warm and dry. No rash noted.   Cardiovascular: Normal heart rate noted  Respiratory: Normal respiratory effort, no problems with respiration noted  Abdomen: Soft, non-tender, non-distended   Pelvic: NEFG.   Exam performed in the presence of a chaperone  Assessment and Plan:  Debbie [name] is a 33 y.o. 4 weeks s/p lsc BS for permanent sterilization  Recovering appropriately *** May return to usual activities without restriction Due for pap in March  No follow-ups on file.  Future Appointments  Date Time Provider Department Center  03/15/2023  1:30 PM Lennart Pall, MD [facility]  03/28/2023  2:15 PM Terri Piedra, DO [facility] None    Lennart Pall, MD

## 2023-03-15 ENCOUNTER — Telehealth: Payer: 59 | Admitting: Obstetrics and Gynecology

## 2023-03-15 DIAGNOSIS — Z09 Encounter for follow-up examination after completed treatment for conditions other than malignant neoplasm: Secondary | ICD-10-CM

## 2023-03-28 ENCOUNTER — Ambulatory Visit (INDEPENDENT_AMBULATORY_CARE_PROVIDER_SITE_OTHER): Payer: 59 | Admitting: Dermatology

## 2023-03-28 ENCOUNTER — Encounter: Payer: Self-pay | Admitting: Dermatology

## 2023-03-28 VITALS — BP 123/81 | HR 73

## 2023-03-28 DIAGNOSIS — L74519 Primary focal hyperhidrosis, unspecified: Secondary | ICD-10-CM

## 2023-03-28 DIAGNOSIS — L750 Bromhidrosis: Secondary | ICD-10-CM | POA: Diagnosis not present

## 2023-03-28 DIAGNOSIS — L74510 Primary focal hyperhidrosis, axilla: Secondary | ICD-10-CM | POA: Diagnosis not present

## 2023-03-28 DIAGNOSIS — L74512 Primary focal hyperhidrosis, palms: Secondary | ICD-10-CM | POA: Diagnosis not present

## 2023-03-28 DIAGNOSIS — L74513 Primary focal hyperhidrosis, soles: Secondary | ICD-10-CM | POA: Diagnosis not present

## 2023-03-28 NOTE — Patient Instructions (Addendum)
 Hello Elliott,  Thank you for visiting us  today.   Here is a summary of the key instructions from today's consultation:  Qbrexza  Usage: Begin or continue using Qbrexza  daily to effectively manage your symptoms.   Application: To extend the usage of the product, you may cut the wipes in half. Use one half for your hands and feet, and store the other half in a Ziploc bag for later use.   Side Effects: Should you experience any side effects such as dry mouth or urinary retention, please adjust the usage accordingly.  Benzoyl Peroxide: Please continue with your current regimen as it has proven effective for you.  Refills: It's important to ensure you have adequate refills for Qbrexza . We will review your prescription status and issue additional scripts if necessary.  Follow-Up Appointment: We will you again in mid to late July to assess your condition during the summertime. This visit will also allow us  to discuss potential treatments, including Botox, if deemed necessary.  Should you have any questions or notice any changes in your condition, please do not hesitate to reach out to our office.  We wish you a wonderful day ahead and look forward to your continued health improvement.  Warm regards,  Dr. Delon Lenis, Dermatology    Important Information  Due to recent changes in healthcare laws, you may see results of your pathology and/or laboratory studies on MyChart before the doctors have had a chance to review them. We understand that in some cases there may be results that are confusing or concerning to you. Please understand that not all results are received at the same time and often the doctors may need to interpret multiple results in order to provide you with the best plan of care or course of treatment. Therefore, we ask that you please give us  2 business days to thoroughly review all your results before contacting the office for clarification. Should we see a critical lab  result, you will be contacted sooner.   If You Need Anything After Your Visit  If you have any questions or concerns for your doctor, please call our main line at [phone] If no one answers, please leave a voicemail as directed and we will return your call as soon as possible. Messages left after 4 pm will be answered the following business day.   You may also send us  a message via MyChart. We typically respond to MyChart messages within 1-2 business days.  For prescription refills, please ask your pharmacy to contact our office. Our fax number is [phone].  If you have an urgent issue when the clinic is closed that cannot wait until the next business day, you can page your doctor at the number below.    Please note that while we do our best to be available for urgent issues outside of office hours, we are not available 24/7.   If you have an urgent issue and are unable to reach us , you may choose to seek medical care at your doctor's office, retail clinic, urgent care center, or emergency room.  If you have a medical emergency, please immediately call 911 or go to the emergency department. In the event of inclement weather, please call our main line at [phone] for an update on the status of any delays or closures.  Dermatology Medication Tips: Please keep the boxes that topical medications come in in order to help keep track of the instructions about where and how to use these. Pharmacies typically print the  medication instructions only on the boxes and not directly on the medication tubes.   If your medication is too expensive, please contact our office at [phone] or send us  a message through MyChart.   We are unable to tell what your co-pay for medications will be in advance as this is different depending on your insurance coverage. However, we may be able to find a substitute medication at lower cost or fill out paperwork to get insurance to cover a needed medication.    If a prior authorization is required to get your medication covered by your insurance company, please allow us  1-2 business days to complete this process.  Drug prices often vary depending on where the prescription is filled and some pharmacies may offer cheaper prices.  The website www.goodrx.com contains coupons for medications through different pharmacies. The prices here do not account for what the cost may be with help from insurance (it may be cheaper with your insurance), but the website can give you the price if you did not use any insurance.  - You can print the associated coupon and take it with your prescription to the pharmacy.  - You may also stop by our office during regular business hours and pick up a GoodRx coupon card.  - If you need your prescription sent electronically to a different pharmacy, notify our office through [facility] or by phone at [phone]

## 2023-03-28 NOTE — Progress Notes (Signed)
   Follow-Up Visit   Subjective  Debbie [name] is a 33 y.o. female who presents for the following: Bromhidrosis - hyperhidrosis  Patient present today for follow up visit. Patient was last evaluated on 01/24/24. Patient reports sxs are better. She has been using the Qbrexa wipes on the hands, feet, and axilla - but she notes that the wipes does not hold. She stated that she is also using the benzole peroxide wash on the affected areas also. Patient denies medication changes.  The following portions of the chart were reviewed this encounter and updated as appropriate: medications, allergies, medical history  Review of Systems:  No other skin or systemic complaints except as noted in HPI or Assessment and Plan.  Objective  Well appearing patient in no apparent distress; mood and affect are within normal limits.   A focused examination was performed of the following areas: hands, feet, and bilateral axilla   Relevant exam findings are noted in the Assessment and Plan.    Assessment & Plan   Hyperhidrosis (Axillary, Palmar, and Plantar)  Bromhidrosis (Feet)  and Bromohidrosis  Assessment: Patient has been utilizing Qbrexza  wipes every 2-3 days for hyperhidrosis affecting the hands, feet, and underarms. The current regimen offers partial relief but fails to maintain symptom control throughout the day. No adverse effects have been reported with the current usage. The patient has also experimented with daily applications for a brief period previously.  Plan:   Increase the frequency of Qbrexza  application to daily or every other day, based on tolerance.   monitor for potential side effects, with a focus on dry mouth and urinary retention.   Advise the patient to cut the wipes in half to maximize their utility, using one half for the hands and feet and storing the remaining half in a sealed bag for future use.   Continue the use of benzoyl peroxide as previously directed.   Schedule  a follow-up appointment in mid to late July to evaluate the treatment's effectiveness.   Should Qbrexza  prove ineffective, consider Botox injections as an alternative treatment strategy.   Ensure the patient has a sufficient supply of Qbrexza  for the next four months; refill prescription as necessary.   Return in about 5 months (around 08/25/2023) for hyperhidrosis & bromhidrosis.    Documentation: I have reviewed the above documentation for accuracy and completeness, and I agree with the above.   I, Shirron Maranda, CMA, am acting as scribe for [employer], DO.   Delon Lenis, DO

## 2023-03-31 DIAGNOSIS — H5704 Mydriasis: Secondary | ICD-10-CM | POA: Diagnosis not present

## 2023-04-06 DIAGNOSIS — H5704 Mydriasis: Secondary | ICD-10-CM | POA: Diagnosis not present

## 2023-04-10 DIAGNOSIS — F331 Major depressive disorder, recurrent, moderate: Secondary | ICD-10-CM | POA: Diagnosis not present

## 2023-04-17 DIAGNOSIS — F331 Major depressive disorder, recurrent, moderate: Secondary | ICD-10-CM | POA: Diagnosis not present

## 2023-04-24 DIAGNOSIS — F331 Major depressive disorder, recurrent, moderate: Secondary | ICD-10-CM | POA: Diagnosis not present

## 2023-04-27 ENCOUNTER — Ambulatory Visit (INDEPENDENT_AMBULATORY_CARE_PROVIDER_SITE_OTHER): Admitting: Physician Assistant

## 2023-04-27 ENCOUNTER — Encounter: Payer: Self-pay | Admitting: Physician Assistant

## 2023-04-27 VITALS — BP 104/76 | HR 91 | Temp 98.1°F | Ht 66.0 in | Wt 141.0 lb

## 2023-04-27 DIAGNOSIS — L74513 Primary focal hyperhidrosis, soles: Secondary | ICD-10-CM | POA: Diagnosis not present

## 2023-04-27 DIAGNOSIS — L74512 Primary focal hyperhidrosis, palms: Secondary | ICD-10-CM | POA: Diagnosis not present

## 2023-04-27 DIAGNOSIS — Z23 Encounter for immunization: Secondary | ICD-10-CM

## 2023-04-27 DIAGNOSIS — G479 Sleep disorder, unspecified: Secondary | ICD-10-CM | POA: Diagnosis not present

## 2023-04-27 DIAGNOSIS — H5704 Mydriasis: Secondary | ICD-10-CM

## 2023-04-27 NOTE — Progress Notes (Signed)
 Patient ID: Debbie [name], female    DOB: 1991/02/04, 33 y.o.   MRN: [id number]   Assessment & Plan:  Mydriasis -     Ambulatory referral to Neurology  Sleep difficulties -     Ambulatory referral to Neurology  Hyperhidrosis of palms and soles  Need for pneumococcal vaccine -     Pneumococcal conjugate vaccine 20-valent    Assessment and Plan    Anisocoria New onset, alternating, associated with use of Trazodone, Seroquel, and Qbrexza. No associated headache, weakness, or sensory changes. Suspected anticholinergic effect from Qbrexza. -Discontinue Qbrexza. Recommend she consult with her dermatologist as well. -Obtain neurology consultation for further evaluation. -Trial of discontinuing all medications for a few weeks to observe for resolution of symptoms.  Insomnia History of Trazodone and Seroquel use for sleep. Currently off both due to suspected association with anisocoria. -Consider restarting Trazodone or alternative sleep aid after discontinuation trial and resolution of anisocoria.  Hyperhidrosis On Qbrexza, with partial response. Discontinuing due to suspected association with anisocoria. -Consider alternative treatments such as Botox injections after discontinuation trial and resolution of anisocoria. She will talk with Dr. Onalee Hua.     Return if symptoms worsen or fail to improve.    Subjective:    Chief Complaint  Patient presents with   Referral    Pt in office for Neuro referral and discuss sleep issues/medication; pt also states been having some issues with vision with one eye dilated and the other wasn't. Was told this was due to taking trazadone. Prescribed Seroquel and same thing happened again. Has taken these meds in the past with issues so wanting to see Neuro to discuss.      HPI  Discussed the use of AI scribe software for clinical note transcription with the patient, who gave verbal consent to proceed.  History of Present Illness    Debbie [name] is a 33 year old female who presents with anisocoria and fatigue.  She has been experiencing anisocoria, characterized by one pupil being more dilated than the other, for about a month. Initially, it occurred without any accompanying headache and did not cause concern. However, it recurred, leading to light sensitivity and temporary blindness in bright conditions, preventing her from going outside.  She consulted an eye doctor who recommended discontinuing trazodone, an antidepressant she had been using for years without prior issues. After stopping trazodone, the anisocoria resolved. Despite this, she experienced a recurrence of anisocoria after taking Seroquel, which she had previously used for sleep without problems. The anisocoria persisted for a day, and she has felt fatigued over the past week.  She has been using Qbrexza, a topical medication for hyperhidrosis on her hands and feet, since December. While it reduces sweating, it does not completely stop it. She questions whether Qbrexza, which contains an anticholinergic agent, might be interacting with trazodone or Seroquel to cause her symptoms. The anisocoria began after starting this medication.  No headaches, weakness on one side, slurred speech, or changes in sensation. She reports difficulty sleeping.        Past Medical History:  Diagnosis Date   Anxiety    Depression    Lupus    Sjogren's syndrome (HCC)     Past Surgical History:  Procedure Laterality Date   CYSTOSCOPY     LAPAROSCOPIC BILATERAL SALPINGECTOMY N/A 02/13/2023   Procedure: LAPAROSCOPIC BILATERAL SALPINGECTOMY;  Surgeon: Lennart Pall, MD;  Location: [facility];  Service: Gynecology;  Laterality: N/A;  Family History  Problem Relation Age of Onset   Thyroid disease Mother    Cancer Maternal Grandmother    Cancer Paternal Grandfather    Hashimoto's thyroiditis Sister    Thyroid disease Sister    Thyroid disease  Brother     Social History   Tobacco Use   Smoking status: Some Days    Types: Cigarettes   Smokeless tobacco: Never   Tobacco comments:    Very rarely, social smoker  Vaping Use   Vaping status: Some Days  Substance Use Topics   Alcohol use: Yes    Comment: Rarely   Drug use: Not Currently    Types: Marijuana     Allergies  Allergen Reactions   Latex Rash   Sulfamethoxazole-Trimethoprim Rash and Swelling    Review of Systems NEGATIVE UNLESS OTHERWISE INDICATED IN HPI      Objective:     BP 104/76 (BP Location: Left Arm, Patient Position: Sitting, Cuff Size: Normal)   Pulse 91   Temp 98.1 F (36.7 C) (Temporal)   Ht 5\' 6"  (1.676 m)   Wt 141 lb (64 kg)   SpO2 98%   BMI 22.76 kg/m   Wt Readings from Last 3 Encounters:  04/27/23 141 lb (64 kg)  02/13/23 144 lb (65.3 kg)  12/08/22 147 lb 3.2 oz (66.8 kg)    BP Readings from Last 3 Encounters:  04/27/23 104/76  03/28/23 123/81  02/13/23 111/72     Physical Exam Vitals and nursing note reviewed.  Constitutional:      Appearance: Normal appearance.  Eyes:     Extraocular Movements: Extraocular movements intact.     Conjunctiva/sclera: Conjunctivae normal.     Pupils: Pupils are equal, round, and reactive to light.     Comments: Left pupil slightly larger than right  Cardiovascular:     Rate and Rhythm: Normal rate and regular rhythm.  Pulmonary:     Effort: Pulmonary effort is normal.     Breath sounds: Normal breath sounds.  Neurological:     General: No focal deficit present.     Mental Status: She is alert and oriented to person, place, and time.     Cranial Nerves: No cranial nerve deficit.     Sensory: No sensory deficit.     Motor: No weakness.     Coordination: Coordination normal.     Gait: Gait normal.  Psychiatric:        Mood and Affect: Mood normal.        Behavior: Behavior normal.      Foy Mungia M Kimm Ungaro, PA-C

## 2023-08-03 ENCOUNTER — Encounter (HOSPITAL_COMMUNITY): Payer: Self-pay

## 2023-08-03 ENCOUNTER — Other Ambulatory Visit (HOSPITAL_COMMUNITY): Payer: Self-pay

## 2023-08-03 ENCOUNTER — Ambulatory Visit (HOSPITAL_COMMUNITY)
Admission: EM | Admit: 2023-08-03 | Discharge: 2023-08-03 | Disposition: A | Discharge status: Home / Self Care | Attending: Family Medicine | Admitting: Family Medicine

## 2023-08-03 ENCOUNTER — Telehealth: Payer: Self-pay | Admitting: Physician Assistant

## 2023-08-03 ENCOUNTER — Ambulatory Visit: Payer: Self-pay

## 2023-08-03 DIAGNOSIS — K6289 Other specified diseases of anus and rectum: Secondary | ICD-10-CM | POA: Diagnosis not present

## 2023-08-03 MED ORDER — KETOROLAC TROMETHAMINE 30 MG/ML IJ SOLN
INTRAMUSCULAR | Status: AC
  Filled 2023-08-03: qty 1

## 2023-08-03 MED ORDER — KETOROLAC TROMETHAMINE 10 MG PO TABS
10.0000 mg | ORAL_TABLET | Freq: Four times a day (QID) | ORAL | 0 refills | Status: DC | PRN
  Filled 2023-08-03: qty 20, 5d supply, fill #0

## 2023-08-03 MED ORDER — KETOROLAC TROMETHAMINE 30 MG/ML IJ SOLN
30.0000 mg | Freq: Once | INTRAMUSCULAR | Status: AC
Start: 2023-08-03 — End: 2023-08-03
  Administered 2023-08-03: 30 mg via INTRAMUSCULAR

## 2023-08-03 NOTE — Telephone Encounter (Signed)
 FYI Only or Action Required?: FYI only for provider  Patient was last seen in primary care on 12/08/22. Called Nurse Triage reporting Rectal Pain. Symptoms began several days ago. Interventions attempted: Nothing. Symptoms are: gradually worsening.  Triage Disposition: See Physician Within 24 Hours  Patient/caregiver understands and will follow disposition?: Yes, but will wait  Copied from CRM [id number]. Topic: Clinical - Red Word Triage >> Aug 03, 2023 10:52 AM Alethia Huxley E wrote: [facility] that prompted transfer to Nurse Triage: Possible hemorrhoid. Patient is having pain in her anus where she is struggling to walk or even sit down. Been going on for a week, but everyday the pain is getting worse. Reason for Disposition  MODERATE-SEVERE rectal pain (i.e., interferes with school, work, or sleep)  Answer Assessment - Initial Assessment Questions 1. SYMPTOM:  What's the main symptom you're concerned about? (e.g., pain, itching, swelling, rash)     Rectal pain 2. ONSET: When did the rectal pain  start?     One week 3. RECTAL PAIN: Do you have any pain around your rectum? How bad is the pain?  (Scale 0-10; or mild, moderate, severe)   - NONE (0): no pain   - MILD (1-3): doesn't interfere with normal activities    - MODERATE (4-7): interferes with normal activities or awakens from sleep, limping    - SEVERE (8-10): excruciating pain, unable to have a bowel movement      5/10  4. RECTAL ITCHING: Do you have any itching in this area? How bad is the itching?  (Scale 0-10; or mild, moderate, severe)   - NONE: no itching   - MILD: doesn't interfere with normal activities    - MODERATE-SEVERE: interferes with normal activities or awakens from sleep     Denies 5. CONSTIPATION: Do you have constipation? If Yes, ask: How bad is it?     Denies, daily 6. CAUSE: What do you think is causing the anus symptoms?     Unsure, maybe hemorrhoid  7. OTHER SYMPTOMS: Do you have any other  symptoms?  (e.g., abdomen pain, fever, rectal bleeding, vomiting)    Blood when she wipes, none on stool.  Protocols used: Rectal Symptoms-A-AH

## 2023-08-03 NOTE — Discharge Instructions (Signed)
 You have been given a shot of Toradol  30 mg today.  Ketorolac  10 mg tablets--take 1 tablet every 6 hours as needed for pain.  This is the same medicine that is in the shot we just gave you  If the treatments provided do not help you at all, or if you worsen in any way, please go to the emergency room for further evaluation.

## 2023-08-03 NOTE — Telephone Encounter (Signed)
 Noted and agreed, thank you.

## 2023-08-03 NOTE — Telephone Encounter (Signed)
 Please see triage note regarding patient; pt scheduled for 08/08/23

## 2023-08-03 NOTE — Telephone Encounter (Signed)
 Pt called and wanted to let you know will be going to UC after work

## 2023-08-03 NOTE — Telephone Encounter (Signed)
 LVM for patient with PCP recommendations; advised to please return my call or send a MyChart message to PCP so we know she got our message and confirm she will be going to be seen. If patient returns call, please send myself message so I can take this call to speak with patient directly.

## 2023-08-03 NOTE — Telephone Encounter (Signed)
 FYI  Copied from CRM [id number]. Topic: General - Other >> Aug 03, 2023 12:54 PM Kita Perish H wrote: Reason for CRM: Patient wanted to advise provider she did receive message and will be going to urgent care after work.

## 2023-08-03 NOTE — ED Provider Notes (Signed)
 [facility]    CSN: [id number] Arrival date & time: 08/03/23  1559      History   Chief Complaint Chief Complaint  Patient presents with   Rectal Pain    HPI Debbie [name] is a 33 y.o. female.   HPI Here for pain around her rectum.  She feels it externally but does not feel any swelling there.  She has had a little blood when she is wiping.  No fever or chills and no vomiting or nausea.  The discomfort has been going on for about 1 week but it worsened in the last 48 hours.  Her primary care office will be seeing her on June 17.  They asked her to be seen in urgent care to make sure she did not have an abscess inside of the hemorrhoid.    Last menstrual period was sometime ago; she has an IUD  She is allergic to sulfa Past Medical History:  Diagnosis Date   Anxiety    Depression    Lupus    Sjogren's syndrome [facility])     Patient Active Problem List   Diagnosis Date Noted   Request for sterilization 10/17/2022   Hyperhidrosis of palms and soles 06/10/2020   Sjogren's syndrome (HCC) 06/12/2017   Lupus erythematosus 06/12/2017   Sjogren syndrome, unspecified (HCC) 06/12/2017    Past Surgical History:  Procedure Laterality Date   CYSTOSCOPY     LAPAROSCOPIC BILATERAL SALPINGECTOMY N/A 02/13/2023   Procedure: LAPAROSCOPIC BILATERAL SALPINGECTOMY;  Surgeon: Izell Marsh, MD;  Location: [facility];  Service: Gynecology;  Laterality: N/A;    OB History     Gravida  0   Para  0   Term  0   Preterm  0   AB  0   Living  0      SAB  0   IAB  0   Ectopic  0   Multiple  0   Live Births  0            Home Medications    Prior to Admission medications   Medication Sig Start Date End Date Taking? Authorizing Provider  ketorolac  (TORADOL ) 10 MG tablet Take 1 tablet (10 mg total) by mouth every 6 (six) hours as needed (pain). 08/03/23  Yes Ann Keto, MD  Glycopyrronium Tosylate  (QBREXZA ) 2.4 % PADS  Apply 1 application  topically at bedtime. 01/24/23   Dellar Fenton, DO  levonorgestrel (LILETTA, 52 MG,) 20.1 MCG/DAY IUD IUD 1 each by Intrauterine route once.    [provider]  magnesium oxide (MAG-OX) 400 (240 Mg) MG tablet Take 400 mg by mouth daily.    [provider]  senna-docusate (SENOKOT-S) 8.6-50 MG tablet Take 2 tablets by mouth at bedtime. Patient not taking: Reported on 04/27/2023 02/13/23   Izell Marsh, MD  traZODone  (DESYREL ) 50 MG tablet Take 1 tablet (50 mg total) by mouth at bedtime. Patient not taking: Reported on 04/27/2023 10/12/22       Family History Family History  Problem Relation Age of Onset   Thyroid disease Mother    Cancer Maternal Grandmother    Cancer Paternal Grandfather    Hashimoto's thyroiditis Sister    Thyroid disease Sister    Thyroid disease Brother     Social History Social History   Tobacco Use   Smoking status: Some Days    Types: Cigarettes   Smokeless tobacco: Never   Tobacco comments:  Very rarely, social smoker  Vaping Use   Vaping status: Some Days  Substance Use Topics   Alcohol use: Yes    Comment: Rarely   Drug use: Not Currently    Types: Marijuana     Allergies   Latex and Sulfamethoxazole-trimethoprim   Review of Systems Review of Systems   Physical Exam Triage Vital Signs ED Triage Vitals [08/03/23 1623]  Encounter Vitals Group     BP 125/87     Girls Systolic BP Percentile      Girls Diastolic BP Percentile      Boys Systolic BP Percentile      Boys Diastolic BP Percentile      Pulse Rate 84     Resp 16     Temp 98.4 F (36.9 C)     Temp Source Oral     SpO2 98 %     Weight      Height      Head Circumference      Peak Flow      Pain Score 3     Pain Loc      Pain Education      Exclude from Growth Chart    No data found.  Updated Vital Signs BP 125/87 (BP Location: Left Arm)   Pulse 84   Temp 98.4 F (36.9 C) (Oral)   Resp 16   SpO2 98%   Visual  Acuity Right Eye Distance:   Left Eye Distance:   Bilateral Distance:    Right Eye Near:   Left Eye Near:    Bilateral Near:     Physical Exam Vitals reviewed.  Constitutional:      General: She is not in acute distress.    Appearance: She is not toxic-appearing or diaphoretic.  Genitourinary:    Comments: Chaperone present during the time of exam.  There are no external hemorrhoids or masses.  There is no swelling or fissures.  No rash.  Digital exam is done and I maybe feel an internal hemorrhoid, but it is small and soft.  Do not feel any other masses or any indication of an abscess on that exam.  Skin:    Coloration: Skin is not jaundiced or pale.   Neurological:     Mental Status: She is alert and oriented to person, place, and time.   Psychiatric:        Behavior: Behavior normal.      UC Treatments / Results  Labs (all labs ordered are listed, but only abnormal results are displayed) Labs Reviewed - No data to display  EKG   Radiology No results found.  Procedures Procedures (including critical care time)  Medications Ordered in UC Medications  ketorolac  (TORADOL ) 30 MG/ML injection 30 mg (30 mg Intramuscular Given 08/03/23 1713)    Initial Impression / Assessment and Plan / UC Course  I have reviewed the triage vital signs and the nursing notes.  Pertinent labs & imaging results that were available during my care of the patient were reviewed by me and considered in my medical decision making (see chart for details).     I discussed with her I cannot tell why she is hurting.  Toradol  injections given here and Toradol  tablets are sent to the pharmacy.  If the treatments provided do not give her any relief or if she worsens, she is to go to the emergency room for further evaluation Final Clinical Impressions(s) / UC Diagnoses   Final diagnoses:  Rectal pain  Discharge Instructions      You have been given a shot of Toradol  30 mg  today.  Ketorolac  10 mg tablets--take 1 tablet every 6 hours as needed for pain.  This is the same medicine that is in the shot we just gave you  If the treatments provided do not help you at all, or if you worsen in any way, please go to the emergency room for further evaluation.    ED Prescriptions     Medication Sig Dispense Auth. Provider   ketorolac  (TORADOL ) 10 MG tablet Take 1 tablet (10 mg total) by mouth every 6 (six) hours as needed (pain). 20 tablet Jett Fukuda K, MD      PDMP not reviewed this encounter.   Ann Keto, MD 08/03/23 1726

## 2023-08-03 NOTE — ED Triage Notes (Signed)
 Pt c/o rectal pain with BRB with wiping only x1wk. States feels no hemorrhoids. States has a PCP appt Tuesday but was referred to get checked to r/o abscess.

## 2023-08-04 ENCOUNTER — Other Ambulatory Visit (HOSPITAL_COMMUNITY): Payer: Self-pay

## 2023-08-08 ENCOUNTER — Ambulatory Visit: Payer: Self-pay | Admitting: Physician Assistant

## 2023-08-08 ENCOUNTER — Encounter: Payer: Self-pay | Admitting: Physician Assistant

## 2023-08-08 ENCOUNTER — Ambulatory Visit (INDEPENDENT_AMBULATORY_CARE_PROVIDER_SITE_OTHER): Admitting: Physician Assistant

## 2023-08-08 VITALS — BP 100/60 | HR 65 | Temp 97.7°F | Ht 66.0 in | Wt 135.0 lb

## 2023-08-08 DIAGNOSIS — K6289 Other specified diseases of anus and rectum: Secondary | ICD-10-CM | POA: Diagnosis not present

## 2023-08-08 LAB — COMPREHENSIVE METABOLIC PANEL WITH GFR
ALT: 10 U/L (ref 0–35)
AST: 17 U/L (ref 0–37)
Albumin: 4.4 g/dL (ref 3.5–5.2)
Alkaline Phosphatase: 44 U/L (ref 39–117)
BUN: 17 mg/dL (ref 6–23)
CO2: 26 meq/L (ref 19–32)
Calcium: 9.4 mg/dL (ref 8.4–10.5)
Chloride: 104 meq/L (ref 96–112)
Creatinine, Ser: 0.84 mg/dL (ref 0.40–1.20)
GFR: 92.95 mL/min (ref >60.00)
Glucose, Bld: 77 mg/dL (ref 70–99)
Potassium: 4.1 meq/L (ref 3.5–5.1)
Sodium: 138 meq/L (ref 135–145)
Total Bilirubin: 0.6 mg/dL (ref 0.2–1.2)
Total Protein: 7.4 g/dL (ref 6.0–8.3)

## 2023-08-08 LAB — CBC WITH DIFFERENTIAL/PLATELET
Basophils Absolute: 0 10*3/uL (ref 0.0–0.1)
Basophils Relative: 0.5 % (ref 0.0–3.0)
Eosinophils Absolute: 0 10*3/uL (ref 0.0–0.7)
Eosinophils Relative: 1 % (ref 0.0–5.0)
HCT: 38.4 % (ref 36.0–46.0)
Hemoglobin: 13 g/dL (ref 12.0–15.0)
Lymphocytes Relative: 21.4 % (ref 12.0–46.0)
Lymphs Abs: 1 10*3/uL (ref 0.7–4.0)
MCHC: 33.9 g/dL (ref 30.0–36.0)
MCV: 85.4 fl (ref 78.0–100.0)
Monocytes Absolute: 0.4 10*3/uL (ref 0.1–1.0)
Monocytes Relative: 9 % (ref 3.0–12.0)
Neutro Abs: 3 10*3/uL (ref 1.4–7.7)
Neutrophils Relative %: 68.1 % (ref 43.0–77.0)
Platelets: 231 10*3/uL (ref 150.0–400.0)
RBC: 4.5 Mil/uL (ref 3.87–5.11)
RDW: 12.9 % (ref 11.5–15.5)
WBC: 4.5 10*3/uL (ref 4.0–10.5)

## 2023-08-08 LAB — C-REACTIVE PROTEIN: CRP: 4.7 mg/dL (ref 0.5–20.0)

## 2023-08-08 NOTE — Patient Instructions (Signed)
  VISIT SUMMARY: Today, you were seen for left-sided perineal pain and pressure that you've been experiencing for about one and a half to two weeks. We discussed your symptoms, performed an examination, and outlined a plan to address your concerns.  YOUR PLAN: LEFT PERINEAL PAIN: You have been experiencing left perineal pain and pressure, which initially was severe but is now dull and bearable. There is a sensation of pressure and a mass on the left side of your perineum. -We will order blood tests (CBC, CMP, and CRP) to check for any signs of infection. -If the blood tests show signs of infection, we will start you on antibiotics. -You will be referred to Dr. Lacey Pian, a gynecologist at the [facility], for further evaluation. -We may consider a pelvic CT scan to rule out other potential issues. -We may also consider referring you to a gastroenterologist for further evaluation. -Continue taking Toradol  for pain management as needed. -Watch for any red flag symptoms such as high fever or severe pain, and go to the ER if these occur.                       Contains text generated by Abridge.                                 Contains text generated by Abridge.

## 2023-08-08 NOTE — Progress Notes (Signed)
 Patient ID: Debbie [name], female    DOB: 07/14/1990, 33 y.o.   MRN: [id number]   Assessment & Plan:  Rectal pain -     CBC with Differential/Platelet -     Comprehensive metabolic panel with GFR -     C-reactive protein      Assessment and Plan Assessment & Plan Left Perineal Pain Presents with left perineal pain for 1.5 to 2 weeks, initially severe, now dull and bearable. Reports pressure and mass sensation on left perineum. Examination reveals deep induration and tenderness in left perineal area, no external bruising or redness. Internal vaginal and rectal exams normal, no pain elicited. Differential includes possible abscess or pelvic floor muscle strain. Experienced low-grade fevers, possibly indicating infection. - Order CBC, CMP, and CRP to assess for infection. - If leukocytosis is present, initiate antibiotic therapy for possible infection. - Refer to gynecologist Dr. Lacey Pian at [facility]. - Consider pelvic CT to rule out other issues. - Consider gastroenterology referral for further evaluation. - Advise continuation of Toradol  for pain management. - Instruct to watch for red flag symptoms such as high fever or severe pain, which would necessitate an ER visit.   General Health Maintenance Has an IUD and had fallopian tubes removed in December with no complications. No current issues with vaginal discharge or menstrual irregularities. - Ensure regular follow-up for IUD maintenance and any gynecological concerns.      No follow-ups on file.    Subjective:    Chief Complaint  Patient presents with   Rectal Pain    Started about a week ago; started to get more painful, couldn't walk or sit; bleeding when wiping; went to UC on Thursday, was told had tiny hemorrhoid but not enough to cause so much pain. Was given toradol  that helped a little but still painful; think I feel a mass on left side of peroneum     HPI Discussed the use of  AI scribe software for clinical note transcription with the patient, who gave verbal consent to proceed.  History of Present Illness Debbie [name] is a 33 year old female who presents with left-sided perineal pain and pressure.  She has been experiencing left-sided perineal ?rectal pain and pressure for approximately one and a half to two weeks. Initially, there was pain and blood when wiping, which she thought might be due to hemorrhoids or a fissure. The pain worsened, making it difficult to walk or stand, but has since become dull and bearable, rated as one out of ten. She describes a sensation of pressure and something being present on the left side of her perineum, which feels different from the right side when pressed. No pain during bowel movements, which are normal without straining.  She went to UC on 08/03/23 - note reviewed.  She has not taken Toradol  today, but it has been effective in managing her pain previously. She experienced low-grade fevers on Sunday and Monday nights, along with congestion, but is unsure if these are related to her current condition. No recent falls, intercourse, or activities that might have contributed to her symptoms.  Her past medical history includes gastrointestinal issues, with episodes of stomach pain and burning that disrupt her sleep once or twice a week. Her dentist suspects acid reflux and recommended discussing it with her primary care provider. She also has lupus, which sometimes causes skin flare-ups, but she has not had any skin infections requiring drainage. She has an IUD  and had her fallopian tubes removed in December, with no complications reported since then. No vaginal discharge or abnormal bleeding.     Past Medical History:  Diagnosis Date   Anxiety    Depression    Lupus    Sjogren's syndrome (HCC)     Past Surgical History:  Procedure Laterality Date   CYSTOSCOPY     LAPAROSCOPIC BILATERAL SALPINGECTOMY N/A 02/13/2023    Procedure: LAPAROSCOPIC BILATERAL SALPINGECTOMY;  Surgeon: Izell Marsh, MD;  Location: [facility];  Service: Gynecology;  Laterality: N/A;    Family History  Problem Relation Age of Onset   Thyroid disease Mother    Cancer Maternal Grandmother    Cancer Paternal Grandfather    Hashimoto's thyroiditis Sister    Thyroid disease Sister    Thyroid disease Brother     Social History   Tobacco Use   Smoking status: Some Days    Types: Cigarettes   Smokeless tobacco: Never   Tobacco comments:    Very rarely, social smoker  Vaping Use   Vaping status: Some Days  Substance Use Topics   Alcohol use: Yes    Comment: Rarely   Drug use: Not Currently    Types: Marijuana     Allergies  Allergen Reactions   Latex Rash   Sulfamethoxazole-Trimethoprim Rash and Swelling    Review of Systems NEGATIVE UNLESS OTHERWISE INDICATED IN HPI      Objective:     BP 100/60   Pulse 65   Temp 97.7 F (36.5 C)   Ht 5' 6 (1.676 m)   Wt 135 lb (61.2 kg)   SpO2 98%   BMI 21.79 kg/m   Wt Readings from Last 3 Encounters:  08/08/23 135 lb (61.2 kg)  04/27/23 141 lb (64 kg)  02/13/23 144 lb (65.3 kg)    BP Readings from Last 3 Encounters:  08/08/23 100/60  08/03/23 125/87  04/27/23 104/76     Physical Exam Vitals and nursing note reviewed. Exam conducted with a chaperone present.  Constitutional:      Appearance: Normal appearance.   Cardiovascular:     Rate and Rhythm: Normal rate and regular rhythm.  Pulmonary:     Effort: Pulmonary effort is normal.  Genitourinary:    Vagina: Normal.     Cervix: Normal.     Uterus: Normal.      Rectum: Normal. No anal fissure, external hemorrhoid or internal hemorrhoid. Normal anal tone.    Neurological:     General: No focal deficit present.     Mental Status: She is alert and oriented to person, place, and time.   Psychiatric:        Mood and Affect: Mood normal.        Behavior: Behavior normal.           Time Spent: 30 minutes of total time was spent on the date of the encounter performing the following actions: chart review prior to seeing the patient, obtaining history, performing a medically necessary exam, counseling on the treatment plan, placing orders, and documenting in our EHR.     Janece Laidlaw M Zamyra Allensworth, PA-C

## 2023-08-16 ENCOUNTER — Other Ambulatory Visit (HOSPITAL_COMMUNITY)
Admission: RE | Admit: 2023-08-16 | Discharge: 2023-08-16 | Disposition: A | Discharge status: Home / Self Care | Source: Ambulatory Visit | Attending: Obstetrics and Gynecology | Admitting: Obstetrics and Gynecology

## 2023-08-16 ENCOUNTER — Ambulatory Visit (INDEPENDENT_AMBULATORY_CARE_PROVIDER_SITE_OTHER): Admitting: Obstetrics and Gynecology

## 2023-08-16 ENCOUNTER — Encounter: Payer: Self-pay | Admitting: Obstetrics and Gynecology

## 2023-08-16 VITALS — BP 129/82 | HR 72 | Ht 66.0 in | Wt 136.0 lb

## 2023-08-16 DIAGNOSIS — R102 Pelvic and perineal pain: Secondary | ICD-10-CM

## 2023-08-16 DIAGNOSIS — Z124 Encounter for screening for malignant neoplasm of cervix: Secondary | ICD-10-CM | POA: Insufficient documentation

## 2023-08-16 NOTE — Progress Notes (Signed)
   RETURN GYNECOLOGY VISIT  Subjective:  Debbie [name] is a 33 y.o. G0P0000 with IUD in place presenting for vaginal/rectal/perineal pain  Went to UC 2 weeks ago with rectal, perineal, and vaginal pain. Exam was non focal. She was given toradol  and started feeling better. Saw PCP ~ 1 week ago and pain was improving, now localized to the left side of her perineum and radiating up her left buttock. PCP noted an area of induration & tenderness deep along the left perineum/buttock. CBC, CMP, and CRP were all normal. Her pain then stopped 4 days ago and has not returned. Has not had something like this before. The only potential inciting event was that she had gone to [place] and was jumping/dancing a lot.   I reviewed UC note by Dr. Vonna on 08/03/23, PCP note by Mardy Allwardt on 08/08/23, CBC 08/08/23, CMP 08/08/23, and CRP 08/08/23.   Objective:   Vitals:   08/16/23 1606  BP: 129/82  Pulse: 72  Weight: 136 lb (61.7 kg)  Height: 5' 6 (1.676 m)    General:  Alert, oriented and cooperative. Patient is in no acute distress.  Skin: Skin is warm and dry. No rash noted.   Cardiovascular: Normal heart rate noted  Respiratory: Normal respiratory effort, no problems with respiration noted  Abdomen: Soft, non-tender, non-distended   Pelvic: NEFG. No rash, no levator/obturator tenderness. Cervix visually normal, IUD strings were not visualized.   Exam performed in the presence of a chaperone  Assessment and Plan:  Debbie [name] is a 33 y.o. with perineal pain, now resolved  1. Cervical cancer screening (Primary) - Cytology - PAP( [place])  2. Perineal pain Unremarkable exam today Suspect MSK pain, but hard to say etiology of her symptoms now that they have resolved and her exam is normal  Return in about 1 year (around 08/15/2024) for annual exam or sooner as needed.  Future Appointments  Date Time Provider Department Center  08/29/2023  4:15 PM Alm Delon SAILOR, DO [facility] None    Kieth JAYSON Carolin, MD

## 2023-08-22 LAB — CYTOLOGY - PAP
Comment: NEGATIVE
Diagnosis: NEGATIVE
Diagnosis: REACTIVE
High risk HPV: NEGATIVE

## 2023-08-23 ENCOUNTER — Ambulatory Visit: Payer: Self-pay | Admitting: Obstetrics and Gynecology

## 2023-08-28 ENCOUNTER — Telehealth: Payer: Self-pay | Admitting: *Deleted

## 2023-08-28 NOTE — Telephone Encounter (Signed)
 Returned call from 1:38 PM. Left patient a message with available appointments for a follow up.

## 2023-08-29 ENCOUNTER — Ambulatory Visit: Payer: 59 | Admitting: Dermatology

## 2023-08-29 ENCOUNTER — Encounter: Payer: Self-pay | Admitting: Dermatology

## 2023-08-29 VITALS — BP 91/62

## 2023-08-29 DIAGNOSIS — L750 Bromhidrosis: Secondary | ICD-10-CM

## 2023-08-29 DIAGNOSIS — B353 Tinea pedis: Secondary | ICD-10-CM

## 2023-08-29 DIAGNOSIS — R61 Generalized hyperhidrosis: Secondary | ICD-10-CM

## 2023-08-29 MED ORDER — SOFDRA 12.45 % EX GEL: 1.0000 | Freq: Every evening | CUTANEOUS | 4 refills | Status: DC

## 2023-08-29 NOTE — Patient Instructions (Addendum)
 Date: Tue Aug 29 2023  Hello Debbie [name],  Thank you for visiting today. Here is a summary of the key instructions:  - Medications: Try Drysol (aluminum chloride) for excessive sweating   - Apply at nighttime on hands and feet   - Can be purchased on [employer]  - New Prescription: Sofdra  gel for excessive sweating   - Apply overnight   - If using on hands, wear cotton gloves covered by another pair of gloves  - Skin Care:   - Continue using benzoyl peroxide as prescribed   - Use clotrimazole for athlete's foot   - Try ZeaSorb antifungal powder for feet (make sure it doesn't contain steroids)   - Use fungal wash in the shower   - Lifestyle Changes:   - Wash hands thoroughly after applying Sofdra  to avoid side effects   - Keep feet dry to prevent fungal infections  - Follow-up: Return for follow-up appointment in 3 months  Please reach out if you have any questions or concerns.  Warm regards,  Dr. Delon Lenis [facility]        Important Information  Due to recent changes in healthcare laws, you may see results of your pathology and/or laboratory studies on MyChart before the doctors have had a chance to review them. We understand that in some cases there may be results that are confusing or concerning to you. Please understand that not all results are received at the same time and often the doctors may need to interpret multiple results in order to provide you with the best plan of care or course of treatment. Therefore, we ask that you please give us  2 business days to thoroughly review all your results before contacting the office for clarification. Should we see a critical lab result, you will be contacted sooner.   If You Need Anything After Your Visit  If you have any questions or concerns for your doctor, please call our main line at [phone] If no one answers, please leave a voicemail as directed and we will return your call as soon as possible. Messages left  after 4 pm will be answered the following business day.   You may also send us  a message via MyChart. We typically respond to MyChart messages within 1-2 business days.  For prescription refills, please ask your pharmacy to contact our office. Our fax number is [phone].  If you have an urgent issue when the clinic is closed that cannot wait until the next business day, you can page your doctor at the number below.    Please note that while we do our best to be available for urgent issues outside of office hours, we are not available 24/7.   If you have an urgent issue and are unable to reach us , you may choose to seek medical care at your doctor's office, retail clinic, urgent care center, or emergency room.  If you have a medical emergency, please immediately call 911 or go to the emergency department. In the event of inclement weather, please call our main line at [phone] for an update on the status of any delays or closures.  Dermatology Medication Tips: Please keep the boxes that topical medications come in in order to help keep track of the instructions about where and how to use these. Pharmacies typically print the medication instructions only on the boxes and not directly on the medication tubes.   If your medication is too expensive, please contact our office at [phone] or send  us  a message through MyChart.   We are unable to tell what your co-pay for medications will be in advance as this is different depending on your insurance coverage. However, we may be able to find a substitute medication at lower cost or fill out paperwork to get insurance to cover a needed medication.   If a prior authorization is required to get your medication covered by your insurance company, please allow us  1-2 business days to complete this process.  Drug prices often vary depending on where the prescription is filled and some pharmacies may offer cheaper prices.  The website  www.goodrx.com contains coupons for medications through different pharmacies. The prices here do not account for what the cost may be with help from insurance (it may be cheaper with your insurance), but the website can give you the price if you did not use any insurance.  - You can print the associated coupon and take it with your prescription to the pharmacy.  - You may also stop by our office during regular business hours and pick up a GoodRx coupon card.  - If you need your prescription sent electronically to a different pharmacy, notify our office through [facility] or by phone at [phone]

## 2023-08-29 NOTE — Progress Notes (Signed)
   Follow-Up Visit   Subjective  Debbie [name] is a 33 y.o. female who presents for follow up of: Hyperhidrosis  Patient was last evaluated on 03/28/23.   Hyperhidrosis (Axillary, Palmar, and Plantar)  Bromhidrosis (Feet)  and Bromohidrosis: Prescribed Qbrexa to use on the hands, feet and axilla nightly. Recommended BPO wash in areas for odor control. Patient reports odor is better as she has been using BPO wash. However, she has D/C Qbrexa wipes as she had a reaction where here eyes were not dilating properly.    The following portions of the chart were reviewed this encounter and updated as appropriate: medications, allergies, medical history  Review of Systems:  No other skin or systemic complaints except as noted in HPI or Assessment and Plan.  Objective  Well appearing patient in no apparent distress; mood and affect are within normal limits.   A focused examination was performed of the following areas: feet, hands & axilla   Relevant exam findings are noted in the Assessment and Plan.    Assessment & Plan    1. Hyperhidrosis / Bromohidrosis - Assessment: Patient previously tried Qbrexza  for excessive sweating, which caused pupil dilation as a side effect. This side effect is unusual when applied correctly to the axillae, suggesting possible facial contamination. Drysol (aluminum chloride) was previously used and is being reconsidered as a treatment option.  - Plan:    Discontinue Qbrexza     Restart Drysol (aluminum chloride) to hands at bedtime     - Apply to affected areas, including hands and feet     - Instruct patient to purchase over-the-counter   Prescribe Sofdra  gel for feet     - Apply nightly to prevent excessive moisture     - For hands: consider wearing cotton gloves covered by regular gloves after application    Follow up in 3 months to assess treatment efficacy  2. Tinea pedis (Athlete's foot) - Assessment: Patient reports a history of athlete's foot  due to excessive sweating. Currently using clotrimazole with good effect, as evidenced by reduced itching and absence of interdigital cracks.  - Plan:    Continue clotrimazole as needed    Recommend ZeaSorb antifungal powder (ensure no steroids)      Advise use of antifungal wash in shower    Education on importance of keeping feet dry to prevent maceration, cracks, and fungal infections   Return for Hyperhidrosis & Bromhidrosis .   Documentation: I have reviewed the above documentation for accuracy and completeness, and I agree with the above.  I, Shirron Maranda, CMA, am acting as scribe for [employer], DO.   Delon Lenis, DO

## 2023-09-07 ENCOUNTER — Encounter: Payer: Self-pay | Admitting: Family

## 2023-09-07 ENCOUNTER — Ambulatory Visit: Admitting: Family

## 2023-09-07 VITALS — BP 112/70 | HR 56 | Temp 98.2°F | Ht 66.0 in | Wt 137.5 lb

## 2023-09-07 DIAGNOSIS — K219 Gastro-esophageal reflux disease without esophagitis: Secondary | ICD-10-CM

## 2023-09-07 DIAGNOSIS — K6289 Other specified diseases of anus and rectum: Secondary | ICD-10-CM | POA: Diagnosis not present

## 2023-09-07 DIAGNOSIS — R350 Frequency of micturition: Secondary | ICD-10-CM

## 2023-09-07 LAB — POCT URINALYSIS DIPSTICK
Bilirubin, UA: NEGATIVE
Blood, UA: NEGATIVE
Glucose, UA: NEGATIVE
Ketones, UA: NEGATIVE
Leukocytes, UA: NEGATIVE
Nitrite, UA: NEGATIVE
Protein, UA: NEGATIVE
Spec Grav, UA: 1.015 (ref 1.010–1.025)
Urobilinogen, UA: 0.2 U/dL
pH, UA: 6.5 (ref 5.0–8.0)

## 2023-09-07 NOTE — Progress Notes (Signed)
 Patient ID: Debbie [name], female    DOB: 03-Feb-1991, 33 y.o.   MRN: [id number]  Chief Complaint  Patient presents with   Rectal Pain    Pt c/o Rectal pain, present for 1 month.   Discussed the use of AI scribe software for clinical note transcription with the patient, who gave verbal consent to proceed.  History of Present Illness Debbie [name] is a 33 year old female with Sjogren's syndrome who presents with pelvic and gastrointestinal pain.  Perineum pain - Present for approximately one month - Initially severe, now persistent pressure in the perineum radiating to the left buttock - Pain is uncomfortable, especially with pressure on the perineum and lower left buttock - No hemorrhoids or significant findings on evaluation by urgent care, PCP and gynecologist  Gastrointestinal pain - Occurs two to three nights per week - Severe enough to disrupt sleep - Located in the stomach area - Bowel movements are normal without pain or constipation - Advised by dentist she had evidence of acid reflux - Sporadic use of over-the-counter medication for acid reflux - Attempts to reduce acidic foods and caffeine  Urinary symptoms - Frequent urination - No pain or foul odor with urination, denies all other sx, states does not normally have w/UTIs in past  Sjogren's syndrome symptoms - Mild disease activity - Dry eyes and dry mouth - Occasional joint pain - No medication taken for Sjogren's syndrome  Assessment & Plan Perineum Pain Intermittent  pain for one month, localized to perineum and left lower buttock. Previous evaluations showed no significant findings. Differential includes muscular strain versus gastrointestinal etiology. Pain possibly related to physical activity. - Refer to gastroenterology for further evaluation.  Frequent Urination Increased urination frequency without typical UTI symptoms. Urine light in color, indicating hydration. Differential includes other  causes. Discussed cranberry products' potential benefits. - Perform urinalysis to check for UTI, will notify via MyChart results. - Advise increased water intake. - Discuss potential use of OTC AZO in future if needed. *UA negative in office today  Acid Reflux Symptoms confirmed by dentist, not consistently managed with OTC medications. Dietary modifications attempted. - Continue OTC acid reflux medication as needed. - Advise dietary modifications to reduce acid intake, including reducing caffeine.  Subjective:    Outpatient Medications Prior to Visit  Medication Sig Dispense Refill   levonorgestrel (LILETTA, 52 MG,) 20.1 MCG/DAY IUD IUD 1 each by Intrauterine route once.     magnesium oxide (MAG-OX) 400 (240 Mg) MG tablet Take 400 mg by mouth daily.     Sofpironium  Bromide (SOFDRA ) 12.45 % GEL Apply 1 Application topically at bedtime. Apply to both feet and between toes. 40.2 mL 4   traZODone  (DESYREL ) 50 MG tablet Take 50 mg by mouth at bedtime.     ketorolac  (TORADOL ) 10 MG tablet Take 1 tablet (10 mg total) by mouth every 6 (six) hours as needed (pain). (Patient not taking: Reported on 09/07/2023) 20 tablet 0   No facility-administered medications prior to visit.   Past Medical History:  Diagnosis Date   Anxiety    Depression    Lupus    Sjogren's syndrome (HCC)    Past Surgical History:  Procedure Laterality Date   CYSTOSCOPY     LAPAROSCOPIC BILATERAL SALPINGECTOMY N/A 02/13/2023   Procedure: LAPAROSCOPIC BILATERAL SALPINGECTOMY;  Surgeon: Erik Kieth BROCKS, MD;  Location: [facility];  Service: Gynecology;  Laterality: N/A;   Allergies  Allergen Reactions   Latex Rash   Sulfamethoxazole-Trimethoprim  Rash and Swelling      Objective:    Physical Exam Vitals and nursing note reviewed.  Constitutional:      Appearance: Normal appearance.  Cardiovascular:     Rate and Rhythm: Normal rate and regular rhythm.  Pulmonary:     Effort: Pulmonary effort  is normal.     Breath sounds: Normal breath sounds.  Musculoskeletal:        General: Normal range of motion.  Skin:    General: Skin is warm and dry.  Neurological:     Mental Status: She is alert.  Psychiatric:        Mood and Affect: Mood normal.        Behavior: Behavior normal.    Temp 98.2 F (36.8 C) (Temporal)   Ht 5' 6 (1.676 m)   Wt 137 lb 8 oz (62.4 kg)   BMI 22.19 kg/m  Wt Readings from Last 3 Encounters:  09/07/23 137 lb 8 oz (62.4 kg)  08/16/23 136 lb (61.7 kg)  08/08/23 135 lb (61.2 kg)      Lucius Krabbe, NP

## 2023-09-13 ENCOUNTER — Encounter (HOSPITAL_BASED_OUTPATIENT_CLINIC_OR_DEPARTMENT_OTHER): Payer: Self-pay | Admitting: Emergency Medicine

## 2023-09-13 ENCOUNTER — Emergency Department (HOSPITAL_BASED_OUTPATIENT_CLINIC_OR_DEPARTMENT_OTHER)
Admission: EM | Admit: 2023-09-13 | Discharge: 2023-09-14 | Disposition: A | Discharge status: Home / Self Care | Attending: Emergency Medicine | Admitting: Emergency Medicine

## 2023-09-13 ENCOUNTER — Other Ambulatory Visit: Payer: Self-pay

## 2023-09-13 ENCOUNTER — Emergency Department (HOSPITAL_BASED_OUTPATIENT_CLINIC_OR_DEPARTMENT_OTHER)

## 2023-09-13 DIAGNOSIS — K6289 Other specified diseases of anus and rectum: Secondary | ICD-10-CM | POA: Diagnosis not present

## 2023-09-13 DIAGNOSIS — Z91040 Latex allergy status: Secondary | ICD-10-CM | POA: Insufficient documentation

## 2023-09-13 LAB — CBC WITH DIFFERENTIAL/PLATELET
Abs Immature Granulocytes: 0.01 K/uL (ref 0.00–0.07)
Basophils Absolute: 0 K/uL (ref 0.0–0.1)
Basophils Relative: 0 %
Eosinophils Absolute: 0 K/uL (ref 0.0–0.5)
Eosinophils Relative: 1 %
HCT: 38.2 % (ref 36.0–46.0)
Hemoglobin: 12.8 g/dL (ref 12.0–15.0)
Immature Granulocytes: 0 %
Lymphocytes Relative: 25 %
Lymphs Abs: 2.1 K/uL (ref 0.7–4.0)
MCH: 28.5 pg (ref 26.0–34.0)
MCHC: 33.5 g/dL (ref 30.0–36.0)
MCV: 85.1 fL (ref 80.0–100.0)
Monocytes Absolute: 0.5 K/uL (ref 0.1–1.0)
Monocytes Relative: 6 %
Neutro Abs: 5.7 K/uL (ref 1.7–7.7)
Neutrophils Relative %: 68 %
Platelets: 329 K/uL (ref 150–400)
RBC: 4.49 MIL/uL (ref 3.87–5.11)
RDW: 12.6 % (ref 11.5–15.5)
WBC: 8.3 K/uL (ref 4.0–10.5)
nRBC: 0 % (ref 0.0–0.2)

## 2023-09-13 LAB — BASIC METABOLIC PANEL WITH GFR
Anion gap: 14 (ref 5–15)
BUN: 14 mg/dL (ref 6–20)
CO2: 23 mmol/L (ref 22–32)
Calcium: 10.1 mg/dL (ref 8.9–10.3)
Chloride: 103 mmol/L (ref 98–111)
Creatinine, Ser: 0.96 mg/dL (ref 0.44–1.00)
GFR, Estimated: >60 mL/min (ref >60)
Glucose, Bld: 95 mg/dL (ref 70–99)
Potassium: 4 mmol/L (ref 3.5–5.1)
Sodium: 140 mmol/L (ref 135–145)

## 2023-09-13 LAB — HCG, SERUM, QUALITATIVE: Preg, Serum: NEGATIVE

## 2023-09-13 MED ORDER — IOHEXOL 300 MG/ML  SOLN
100.0000 mL | Freq: Once | INTRAMUSCULAR | Status: AC | PRN
  Administered 2023-09-13: 85 mL via INTRAVENOUS

## 2023-09-13 MED ORDER — KETOROLAC TROMETHAMINE 30 MG/ML IJ SOLN
30.0000 mg | Freq: Once | INTRAMUSCULAR | Status: AC
  Administered 2023-09-13: 30 mg via INTRAVENOUS
  Filled 2023-09-13: qty 1

## 2023-09-13 NOTE — ED Notes (Signed)
 Patient transported to CT

## 2023-09-13 NOTE — ED Provider Notes (Signed)
 [place] EMERGENCY DEPARTMENT AT [facility] Provider Note   CSN: [id number] Arrival date & time: 09/13/23  1731     Patient presents with: Rectal Pain   Debbie [name] is a 33 y.o. female.  {Add pertinent medical, surgical, social history, OB history to HPI:32950} HPI   33 year old female presents to the emergency department with rectal/perineal pain.  Patient states that this has been ongoing for the past 6 weeks.  She is seen seen by her primary doctor and OB/GYN without significant etiology identified.  She is working on follow-up with gastroenterology.  She comes tonight with worsening rectal pain that radiates along her perineum and up into her buttocks.  She also at this time has focal firmness/swelling around the rectum and into the perineum.  When all of her symptoms started she did have an episode of bloody stool as well as 1 episode of mucus.  But more recently her bowel movements have been normal.  She has an IUD so does not get menstrual periods.  OB evaluation was unremarkable.  She denies any fever, no urinary symptoms.  Prior to Admission medications   Medication Sig Start Date End Date Taking? Authorizing Provider  ketorolac  (TORADOL ) 10 MG tablet Take 1 tablet (10 mg total) by mouth every 6 (six) hours as needed (pain). Patient not taking: Reported on 09/07/2023 08/03/23   Banister, Pamela K, MD  levonorgestrel (LILETTA, 52 MG,) 20.1 MCG/DAY IUD IUD 1 each by Intrauterine route once.    [provider]  magnesium oxide (MAG-OX) 400 (240 Mg) MG tablet Take 400 mg by mouth daily.    [provider]  Sofpironium  Bromide (SOFDRA ) 12.45 % GEL Apply 1 Application topically at bedtime. Apply to both feet and between toes. 08/29/23   Alm Delon SAILOR, DO  traZODone  (DESYREL ) 50 MG tablet Take 50 mg by mouth at bedtime.    [provider]    Allergies: Latex and Sulfamethoxazole-trimethoprim    Review of Systems  Constitutional:  Negative  for fever.  Respiratory:  Negative for shortness of breath.   Cardiovascular:  Negative for chest pain.  Gastrointestinal:  Positive for rectal pain. Negative for abdominal pain, diarrhea and vomiting.  Genitourinary:  Negative for dysuria, vaginal bleeding, vaginal discharge and vaginal pain.  Skin:  Negative for rash.  Neurological:  Negative for headaches.    Updated Vital Signs BP 112/81 (BP Location: Right Arm)   Pulse 62   Temp 98 F (36.7 C) (Oral)   Resp 18   SpO2 100%   Physical Exam Vitals and nursing note reviewed.  Constitutional:      Appearance: Normal appearance.  HENT:     Head: Normocephalic.     Mouth/Throat:     Mouth: Mucous membranes are moist.  Cardiovascular:     Rate and Rhythm: Normal rate.  Pulmonary:     Effort: Pulmonary effort is normal. No respiratory distress.  Abdominal:     Palpations: Abdomen is soft.     Tenderness: There is no abdominal tenderness.  Genitourinary:    Comments: No external hemorrhoids, indurated and firm palpation around the rectum and towards perineum  Skin:    General: Skin is warm.  Neurological:     Mental Status: She is alert and oriented to person, place, and time. Mental status is at baseline.  Psychiatric:        Mood and Affect: Mood normal.     (all labs ordered are listed, but only abnormal results are displayed)  Labs Reviewed  CBC WITH DIFFERENTIAL/PLATELET  BASIC METABOLIC PANEL WITH GFR  HCG, SERUM, QUALITATIVE    EKG: None  Radiology: No results found.  {Document cardiac monitor, telemetry assessment procedure when appropriate:32947} Procedures   Medications Ordered in the ED  ketorolac  (TORADOL ) 30 MG/ML injection 30 mg (30 mg Intravenous Given 09/13/23 2118)      {Click here for ABCD2, HEART and other calculators REFRESH Note before signing:1}                              Medical Decision Making Amount and/or Complexity of Data Reviewed Labs: ordered. Radiology:  ordered.  Risk Prescription drug management.   ***  {Document critical care time when appropriate  Document review of labs and clinical decision tools ie CHADS2VASC2, etc  Document your independent review of radiology images and any outside records  Document your discussion with family members, caretakers and with consultants  Document social determinants of health affecting pt's care  Document your decision making why or why not admission, treatments were needed:32947:::1}   Final diagnoses:  None    ED Discharge Orders     None

## 2023-09-13 NOTE — ED Triage Notes (Signed)
 Rectal pain x 6 weeks Seen at [facility] and pcp. Patient-  I think I have an internal prolapse 1 episode of bleeding while wiping 6 weeks ago  Swelling around perineum  Normal BM

## 2023-09-14 ENCOUNTER — Encounter: Payer: Self-pay | Admitting: Obstetrics and Gynecology

## 2023-09-14 ENCOUNTER — Other Ambulatory Visit (HOSPITAL_COMMUNITY): Payer: Self-pay

## 2023-09-14 ENCOUNTER — Other Ambulatory Visit: Payer: Self-pay

## 2023-09-14 MED ORDER — CEPHALEXIN 250 MG PO CAPS
500.0000 mg | ORAL_CAPSULE | Freq: Once | ORAL | Status: AC
  Administered 2023-09-14: 500 mg via ORAL
  Filled 2023-09-14: qty 2

## 2023-09-14 MED ORDER — CEPHALEXIN 500 MG PO CAPS
500.0000 mg | ORAL_CAPSULE | Freq: Four times a day (QID) | ORAL | 0 refills | Status: DC
  Filled 2023-09-14 (×2): qty 20, 5d supply, fill #0

## 2023-09-14 MED ORDER — KETOROLAC TROMETHAMINE 10 MG PO TABS
10.0000 mg | ORAL_TABLET | Freq: Four times a day (QID) | ORAL | 0 refills | Status: DC | PRN
  Filled 2023-09-14 (×2): qty 20, 5d supply, fill #0

## 2023-09-14 NOTE — Discharge Instructions (Signed)
 You have been seen and discharged from the emergency department.  You were found to have area of soft tissue swelling/inflammation.  We will treat this as potentially infectious.  It is very important to follow-up with OB/GYN and gastroenterology for further evaluation and treatment of this area.   Follow-up with your primary provider for further evaluation and further care. Take home medications as prescribed. If you have any worsening symptoms or further concerns for your health please return to an emergency department for further evaluation.

## 2023-09-19 ENCOUNTER — Encounter: Payer: Self-pay | Admitting: Pediatrics

## 2023-09-19 ENCOUNTER — Other Ambulatory Visit (INDEPENDENT_AMBULATORY_CARE_PROVIDER_SITE_OTHER)

## 2023-09-19 ENCOUNTER — Ambulatory Visit: Admitting: Pediatrics

## 2023-09-19 ENCOUNTER — Other Ambulatory Visit (HOSPITAL_COMMUNITY): Payer: Self-pay

## 2023-09-19 VITALS — BP 110/76 | HR 80 | Ht 66.0 in | Wt 141.0 lb

## 2023-09-19 DIAGNOSIS — K625 Hemorrhage of anus and rectum: Secondary | ICD-10-CM

## 2023-09-19 DIAGNOSIS — R1010 Upper abdominal pain, unspecified: Secondary | ICD-10-CM

## 2023-09-19 DIAGNOSIS — R935 Abnormal findings on diagnostic imaging of other abdominal regions, including retroperitoneum: Secondary | ICD-10-CM | POA: Diagnosis not present

## 2023-09-19 DIAGNOSIS — K6289 Other specified diseases of anus and rectum: Secondary | ICD-10-CM

## 2023-09-19 DIAGNOSIS — R109 Unspecified abdominal pain: Secondary | ICD-10-CM | POA: Diagnosis not present

## 2023-09-19 LAB — C-REACTIVE PROTEIN: CRP: 1.4 mg/dL (ref 0.5–20.0)

## 2023-09-19 MED ORDER — CIPROFLOXACIN HCL 500 MG PO TABS
500.0000 mg | ORAL_TABLET | Freq: Two times a day (BID) | ORAL | 0 refills | Status: DC
Start: 2023-09-19 — End: 2023-10-06
  Filled 2023-09-19: qty 28, 14d supply, fill #0

## 2023-09-19 MED ORDER — PANTOPRAZOLE SODIUM 40 MG PO TBEC
40.0000 mg | DELAYED_RELEASE_TABLET | Freq: Every day | ORAL | 1 refills | Status: AC
  Filled 2023-09-19: qty 90, 90d supply, fill #0

## 2023-09-19 MED ORDER — METRONIDAZOLE 500 MG PO TABS
500.0000 mg | ORAL_TABLET | Freq: Two times a day (BID) | ORAL | 0 refills | Status: DC
  Filled 2023-09-19: qty 28, 14d supply, fill #0

## 2023-09-19 NOTE — Patient Instructions (Addendum)
 Your provider has requested that you go to the basement level for lab work before leaving today. Press B on the elevator. The lab is located at the first door on the left as you exit the elevator.  Due to recent changes in healthcare laws, you may see the results of your imaging and laboratory studies on MyChart before your provider has had a chance to review them.  We understand that in some cases there may be results that are confusing or concerning to you. Not all laboratory results come back in the same time frame and the provider may be waiting for multiple results in order to interpret others.  Please give us  48 hours in order for your provider to thoroughly review all the results before contacting the office for clarification of your results.    You have been scheduled for an MR Enterography at [facility] on 09/29/23. Your appointment time is 8:00 am. Please arrive to admitting (at main entrance of the hospital) 1 hour prior to your appointment time for registration purposes. Please make certain not to have anything to eat or drink 6 hours prior to your test. In addition, if you have any metal in your body, have a pacemaker or defibrillator, please be sure to let your ordering physician know. This test typically takes 45 minutes to 1 hour to complete. Should you need to reschedule, please call [phone] to do so.  We have sent the following medications to your pharmacy for you to pick up at your convenience: Ciprofloxacin  500 mg twice a day for 14 days Flagyl  500 mg twice a day for 14 days. Pantoprazole  40 mg once daily.  Thank you for entrusting me with your care and for choosing [facility], Dr. Inocente Hausen  _______________________________________________________  If your blood pressure at your visit was 140/90 or greater, please contact your primary care physician to follow up on this.  _______________________________________________________  If you are age 10 or  older, your body mass index should be between 23-30. Your Body mass index is 22.76 kg/m. If this is out of the aforementioned range listed, please consider follow up with your Primary Care Provider.  If you are age 21 or younger, your body mass index should be between 19-25. Your Body mass index is 22.76 kg/m. If this is out of the aformentioned range listed, please consider follow up with your Primary Care Provider.   ________________________________________________________  The [place] [facility] providers would like to encourage you to use MYCHART to communicate with providers for non-urgent requests or questions.  Due to long hold times on the telephone, sending your provider a message by [facility] may be a faster and more efficient way to get a response.  Please allow 48 business hours for a response.  Please remember that this is for non-urgent requests.  _______________________________________________________  [facility] is using a team-based approach to care.  Your team is made up of your doctor and two to three APPS. Our APPS (Nurse Practitioners and Physician Assistants) work with your physician to ensure care continuity for you. They are fully qualified to address your health concerns and develop a treatment plan. They communicate directly with your gastroenterologist to care for you. Seeing the Advanced Practice Practitioners on your physician's team can help you by facilitating care more promptly, often allowing for earlier appointments, access to diagnostic testing, procedures, and other specialty referrals.

## 2023-09-20 ENCOUNTER — Other Ambulatory Visit

## 2023-09-20 ENCOUNTER — Other Ambulatory Visit: Payer: Self-pay

## 2023-09-20 DIAGNOSIS — K6289 Other specified diseases of anus and rectum: Secondary | ICD-10-CM | POA: Diagnosis not present

## 2023-09-20 LAB — HEPATITIS B SURFACE ANTIBODY,QUALITATIVE: Hep B S Ab: NONREACTIVE

## 2023-09-20 LAB — HEPATITIS B SURFACE ANTIGEN: Hepatitis B Surface Ag: NONREACTIVE

## 2023-09-20 LAB — HEPATITIS A ANTIBODY, TOTAL: Hepatitis A AB,Total: NONREACTIVE

## 2023-09-20 LAB — HEPATITIS B CORE ANTIBODY, TOTAL: Hep B Core Total Ab: NONREACTIVE

## 2023-09-20 LAB — HEPATITIS C ANTIBODY: Hepatitis C Ab: NONREACTIVE

## 2023-09-20 LAB — SEDIMENTATION RATE: Sed Rate: 19 mm/h (ref 0–20)

## 2023-09-21 ENCOUNTER — Encounter: Payer: Self-pay | Admitting: Pediatrics

## 2023-09-21 ENCOUNTER — Ambulatory Visit: Payer: Self-pay | Admitting: Pediatrics

## 2023-09-21 NOTE — Progress Notes (Signed)
 [facility] Initial Consultation   Referring Provider Allwardt, Mardy HERO, PA-C [address] [place],  [place] [address]  Primary Care Provider Allwardt, Mardy HERO, PA-C  Patient Profile: Debbie [name] is a 33 y.o. female who is seen in consultation in the [facility] at the request of Dr. Kathrene for evaluation and management of the problem(s) noted below.  Problem List: Perianal/perineal pain Abdominal pain Rectal bleeding Abnormal CT showing bilateral perianal phlegmon   History of Present Illness   Debbie [name] is a 33 y.o. female with a history of  SLE vs. Sjogren's syndrome anxiety and depression who presents to the gastroenterology office for evaluation and management of perianal/perineal pain and abdominal pain.  Debbie [name] reports first experiencing pain in her perineal region on 07/29/2023 Around that time saw a small amount of blood on the tissue paper after wiping and assumed she had an anal fissure Also reports prior history of burning abdominal pain in her mid abdomen lasting for 2-3 nights Endorsed mild nausea but no vomiting Abdominal symptoms subsequently spontaneous resolved without intervention  Seen in urgent care 08/03/2023 -no external hemorrhoids, masses, swelling or fissures Possible internal hemorrhoid on DRE, no abscess Given Toradol  with some relief of discomfort  Evaluated by PCP 08/08/2023 -internal vaginal and rectal exams normal CBC, CMP, CRP normal  OB/GYN evaluation 08/16/2023-suspect musculoskeletal pain as symptoms had improved  Presented to ED 09/13/2023 due to worsening rectal/perineal pain Labs unremarkable CTAP -multiple areas of soft tissue attenuation in the bilateral ischial rectal fossa with surrounding soft tissue attenuation and inflammatory fat stranding suggestive of bilateral perianal phlegmonous change Prescribed a course of Keflex   In the office today, Debbie [name] endorses ongoing perineal  discomfort Describes a noise in the perianal region that queries her for extrusion of air or potential fistula No change in bowel habits -1-2 formed stools a day without blood or mucus No diarrhea Previous abdominal pain described above has not recurred Denies upper GI tract symptoms of GERD, nausea, vomiting, dysphagia or odynophagia  Denies unexplained fevers, chills, joint pain, skin rashes or oral ulcers  No known family history of inflammatory bowel disease or other GI disorders Strong family history of thyroid disease   Last colonoscopy: None Last endoscopy: None  Last Abd CT/CTE/MRE: CTAP 08/2023 - multiple areas of soft tissue attenuation in the bilateral ischial rectal fossa with surrounding soft tissue attenuation and inflammatory fat stranding suggestive of bilateral perianal phlegmonous change  GI Review of Symptoms Significant for perineal/perianal pain, abdominal pain, rectal bleeding. Otherwise negative.  General Review of Systems  Review of systems is significant for the pertinent positives and negatives as listed per the HPI.  Full ROS is otherwise negative.  Past Medical History   Past Medical History:  Diagnosis Date   Anxiety    Depression    Lupus    Sjogren's syndrome (HCC)      Past Surgical History   Past Surgical History:  Procedure Laterality Date   CYSTOSCOPY     LAPAROSCOPIC BILATERAL SALPINGECTOMY N/A 02/13/2023   Procedure: LAPAROSCOPIC BILATERAL SALPINGECTOMY;  Surgeon: Erik Kieth BROCKS, MD;  Location: [facility];  Service: Gynecology;  Laterality: N/A;     Allergies and Medications   Allergies  Allergen Reactions   Latex Rash   Sulfamethoxazole-Trimethoprim Rash and Swelling   Current Meds  Medication Sig   ciprofloxacin  (CIPRO ) 500 MG tablet Take 1 tablet (500 mg total) by mouth 2 (two) times daily.   ketorolac  (TORADOL ) 10 MG tablet Take  1 tablet (10 mg total) by mouth every 6 (six) hours as needed (pain).    levonorgestrel (LILETTA, 52 MG,) 20.1 MCG/DAY IUD IUD 1 each by Intrauterine route once.   magnesium oxide (MAG-OX) 400 (240 Mg) MG tablet Take 400 mg by mouth daily.   metroNIDAZOLE  (FLAGYL ) 500 MG tablet Take 1 tablet (500 mg total) by mouth 2 (two) times daily.   pantoprazole  (PROTONIX ) 40 MG tablet Take 1 tablet (40 mg total) by mouth daily. Take 20-30 minutes before meal.   Sofpironium  Bromide (SOFDRA ) 12.45 % GEL Apply 1 Application topically at bedtime. Apply to both feet and between toes.   traZODone  (DESYREL ) 50 MG tablet Take 50 mg by mouth at bedtime.     Family History   Family History  Problem Relation Age of Onset   Thyroid disease Mother    Cancer Maternal Grandmother    Cancer Paternal Grandfather    Hashimoto's thyroiditis Sister    Thyroid disease Sister    Thyroid disease Brother      Social History   Social History   Tobacco Use   Smoking status: Some Days    Types: Cigarettes   Smokeless tobacco: Never   Tobacco comments:    Very rarely, social smoker  Vaping Use   Vaping status: Some Days  Substance Use Topics   Alcohol use: Yes    Comment: Rarely   Drug use: Not Currently    Types: Marijuana   Debbie [name] reports that she has been smoking cigarettes. She has never used smokeless tobacco. She reports current alcohol use. She reports that she does not currently use drugs after having used the following drugs: Marijuana.  Vital Signs and Physical Examination   Vitals:   09/19/23 1601  BP: 110/76  Pulse: 80   Body mass index is 22.76 kg/m. Weight: 141 lb (64 kg)  General: Well developed, well nourished, no acute distress Head: Normocephalic and atraumatic Eyes: Sclerae anicteric, EOMI Ears: Normal auditory acuity Mouth: No deformities or lesions noted Lungs: Clear throughout to auscultation Heart: Regular rate and rhythm; No murmurs, rubs or bruits Abdomen: Soft, non tender and non distended. No masses, hepatosplenomegaly or hernias noted.  Normal Bowel sounds Rectal: Diffuse induration and tenderness over the left perianal region without discrete abscess, erythema or fluctuance; no perianal fissures, fistula or skin tags; tenderness on DRE but no abscesses palpated Musculoskeletal: Symmetrical with no gross deformities    Review of Data  The following data was reviewed at the time of this encounter:  Laboratory Studies      Latest Ref Rng & Units 09/13/2023    9:17 PM 08/08/2023    9:30 AM 02/13/2023    6:10 AM  CBC  WBC 4.0 - 10.5 K/uL 8.3  4.5  5.3   Hemoglobin 12.0 - 15.0 g/dL 87.1  86.9  85.4   Hematocrit 36.0 - 46.0 % 38.2  38.4  44.0   Platelets 150 - 400 K/uL 329  231.0  240     No results found for: LIPASE    Latest Ref Rng & Units 09/13/2023    9:17 PM 08/08/2023    9:30 AM 12/08/2022    1:44 PM  CMP  Glucose 70 - 99 mg/dL 95  77  84   BUN 6 - 20 mg/dL 14  17  13    Creatinine 0.44 - 1.00 mg/dL 9.03  9.15  9.11   Sodium 135 - 145 mmol/L 140  138  139   Potassium 3.5 -  5.1 mmol/L 4.0  4.1  3.8   Chloride 98 - 111 mmol/L 103  104  102   CO2 22 - 32 mmol/L 23  26  29    Calcium 8.9 - 10.3 mg/dL 89.8  9.4  9.8   Total Protein 6.0 - 8.3 g/dL  7.4  7.5   Total Bilirubin 0.2 - 1.2 mg/dL  0.6  0.5   Alkaline Phos 39 - 117 U/L  44  38   AST 0 - 37 U/L  17  18   ALT 0 - 35 U/L  10  13    Lab Results  Component Value Date   ESRSEDRATE 19 09/19/2023   Lab Results  Component Value Date   CRP 1.4 09/19/2023     Imaging Studies  CTAP 09/13/2023 FINDINGS: Lower chest: No acute abnormality.   Hepatobiliary: No focal liver abnormality is seen. No gallstones, gallbladder wall thickening, or biliary dilatation.   Pancreas: Unremarkable. No pancreatic ductal dilatation or surrounding inflammatory changes.   Spleen: Normal in size without focal abnormality.   Adrenals/Urinary Tract: Adrenal glands are unremarkable. Kidneys are normal, without renal calculi, focal lesion, or hydronephrosis. Bladder is  unremarkable.   Stomach/Bowel: Stomach is within normal limits. Appendix appears normal. No evidence of bowel wall thickening, distention, or inflammatory changes.   Vascular/Lymphatic: No significant vascular findings are present. No enlarged abdominal or pelvic lymph nodes.   Reproductive: An IUD is in place. The uterus and bilateral adnexa are otherwise unremarkable.   Other: No abdominal wall hernia or abnormality. No abdominopelvic ascites.   Musculoskeletal: Multiple small areas of toe tissue attenuation are seen within the bilateral ischial rectal fossa (the largest on the right measures approximately 15 mm, while the largest on the left measures approximately 9 mm). A mild amount of surrounding soft tissue attenuation and inflammatory fat stranding is noted. No associated fluid collections are seen.   No acute osseous abnormalities are identified.   IMPRESSION: Findings consistent with bilateral perianal phlegmonous changes.    GI Procedures and Studies  No prior EGD or colonoscopy   Clinical Impression  It is my clinical impression that Debbie [name] is a 33 y.o. female with;  Perianal/perineal pain Abdominal pain Rectal bleeding Abnormal CT scan showing bilateral perianal phlegmon  Adiba presents to the office today for a 7-week history of perianal/perineal pain as well as transient abdominal pain and rectal bleeding.  Previous evaluations have not disclosed any evidence of hemorrhoids or gynecologic abnormalities.  She was recently seen in the emergency department where a CT scan of the abdomen pelvis disclosed bilateral perianal phlegmonous change.  On my physical examination today I appreciate significant induration along the left perianal region.  There is no discrete abscess or fluctuance.  No visible fistula.  She has mild tenderness on digital rectal examination but no palpable internal abscess.  Debbie [name]'s physical exam findings and recent imaging in the  setting of her history of underlying autoimmune disease raises concern for the possibility of evolving perianal Crohn's disease.  I have recommended additional laboratory testing today, imaging with an MR enterography and perianal protocol.  As I have a high suspicion for a potential diagnosis of perianal Crohn's disease I will also discussed with her proceeding with scheduling a future colonoscopy for additional evaluation.  In the short-term I have recommended antibiotic management targeted towards gastrointestinal microbes that can complicate Crohn's abscess as well as sitz bath's.  Plan  Labs today: Prebiologic laboratory studies, CRP, fecal calprotectin and H. pylori  stool antigen Schedule MR enterography with perianal protocol to evaluate for perianal Crohn's disease Schedule EGD and colonoscopy for investigation of abdominal pain and potential Crohn's disease Start ciprofloxacin  500 mg p.o. twice daily and metronidazole  500 mg p.o. twice daily x 14 days -May consider extending course if needed Start pantoprazole  40 mg orally daily for upper abdominal pain Daily sitz bath's Tylenol  as needed for pain-advised limiting exposure to NSAIDs in the setting of potential Crohn's disease Counseled patient that if symptoms acutely worsen or if an abscess evolves she should notify our office as she may require urgent colorectal surgery evaluation  Planned Follow Up  6 weeks  The patient or caregiver verbalized understanding of the material covered, with no barriers to understanding. All questions were answered. Patient or caregiver is agreeable with the plan outlined above.    It was a pleasure to see Debbie [name].  If you have any questions or concerns regarding this evaluation, do not hesitate to contact me.  Inocente Hausen, MD [facility]

## 2023-09-22 ENCOUNTER — Other Ambulatory Visit: Payer: Self-pay

## 2023-09-22 ENCOUNTER — Other Ambulatory Visit (HOSPITAL_COMMUNITY): Payer: Self-pay

## 2023-09-22 DIAGNOSIS — K625 Hemorrhage of anus and rectum: Secondary | ICD-10-CM

## 2023-09-22 DIAGNOSIS — R935 Abnormal findings on diagnostic imaging of other abdominal regions, including retroperitoneum: Secondary | ICD-10-CM

## 2023-09-22 DIAGNOSIS — R1010 Upper abdominal pain, unspecified: Secondary | ICD-10-CM

## 2023-09-22 DIAGNOSIS — K6289 Other specified diseases of anus and rectum: Secondary | ICD-10-CM

## 2023-09-22 LAB — QUANTIFERON-TB GOLD PLUS
Mitogen-NIL: 7.93 [IU]/mL
NIL: 0.02 [IU]/mL
QuantiFERON-TB Gold Plus: NEGATIVE
TB1-NIL: 0.01 [IU]/mL
TB2-NIL: 0 [IU]/mL

## 2023-09-22 LAB — CALPROTECTIN, FECAL: Calprotectin, Fecal: 32 ug/g (ref 0–120)

## 2023-09-22 LAB — H. PYLORI ANTIGEN, STOOL: H pylori Ag, Stl: NEGATIVE

## 2023-09-22 MED ORDER — NA SULFATE-K SULFATE-MG SULF 17.5-3.13-1.6 GM/177ML PO SOLN
1.0000 | Freq: Once | ORAL | 0 refills | Status: AC
  Filled 2023-09-22: qty 354, 1d supply, fill #0

## 2023-09-25 ENCOUNTER — Other Ambulatory Visit: Payer: Self-pay

## 2023-09-29 ENCOUNTER — Ambulatory Visit (HOSPITAL_COMMUNITY)

## 2023-09-29 ENCOUNTER — Ambulatory Visit (HOSPITAL_COMMUNITY)
Admission: RE | Admit: 2023-09-29 | Discharge: 2023-09-29 | Disposition: A | Discharge status: Home / Self Care | Source: Ambulatory Visit | Attending: Pediatrics | Admitting: Pediatrics

## 2023-09-29 DIAGNOSIS — K6289 Other specified diseases of anus and rectum: Secondary | ICD-10-CM | POA: Insufficient documentation

## 2023-09-29 DIAGNOSIS — Z0389 Encounter for observation for other suspected diseases and conditions ruled out: Secondary | ICD-10-CM | POA: Diagnosis not present

## 2023-09-29 MED ORDER — GADOBUTROL 1 MMOL/ML IV SOLN
6.5000 mL | Freq: Once | INTRAVENOUS | Status: AC | PRN
  Administered 2023-09-29: 6.5 mL via INTRAVENOUS

## 2023-10-02 ENCOUNTER — Encounter: Payer: Self-pay | Admitting: Pediatrics

## 2023-10-02 ENCOUNTER — Other Ambulatory Visit (HOSPITAL_COMMUNITY): Payer: Self-pay

## 2023-10-02 ENCOUNTER — Other Ambulatory Visit: Payer: Self-pay | Admitting: Medical Genetics

## 2023-10-03 ENCOUNTER — Other Ambulatory Visit (HOSPITAL_COMMUNITY)
Admission: RE | Admit: 2023-10-03 | Discharge: 2023-10-03 | Disposition: A | Discharge status: Home / Self Care | Payer: Self-pay | Source: Ambulatory Visit | Attending: Oncology | Admitting: Oncology

## 2023-10-06 ENCOUNTER — Other Ambulatory Visit (HOSPITAL_COMMUNITY): Payer: Self-pay

## 2023-10-06 MED ORDER — CIPROFLOXACIN HCL 500 MG PO TABS
500.0000 mg | ORAL_TABLET | Freq: Two times a day (BID) | ORAL | 0 refills | Status: DC
  Filled 2023-10-06: qty 60, 30d supply, fill #0

## 2023-10-06 MED ORDER — CIPROFLOXACIN HCL 500 MG PO TABS
500.0000 mg | ORAL_TABLET | Freq: Two times a day (BID) | ORAL | 0 refills | Status: DC
Start: 2023-10-06 — End: 2023-11-08
  Filled 2023-10-06: qty 60, 30d supply, fill #0

## 2023-10-06 MED ORDER — ONDANSETRON 8 MG PO TBDP
8.0000 mg | ORAL_TABLET | Freq: Three times a day (TID) | ORAL | 1 refills | Status: AC | PRN
  Filled 2023-10-06: qty 20, 7d supply, fill #0

## 2023-10-07 ENCOUNTER — Other Ambulatory Visit (HOSPITAL_COMMUNITY): Payer: Self-pay

## 2023-10-09 ENCOUNTER — Other Ambulatory Visit (HOSPITAL_COMMUNITY): Payer: Self-pay

## 2023-10-09 MED ORDER — METRONIDAZOLE 500 MG PO TABS
500.0000 mg | ORAL_TABLET | Freq: Two times a day (BID) | ORAL | 0 refills | Status: DC
  Filled 2023-10-09: qty 60, 30d supply, fill #0

## 2023-10-09 NOTE — Addendum Note (Signed)
 Addended by: Sims Laday N on: 10/09/2023 11:27 AM   Modules accepted: Orders

## 2023-10-09 NOTE — Progress Notes (Deleted)
 [facility] History and Physical   Primary Care Physician:  Allwardt, Mardy HERO, PA-C   Reason for Procedure:  Nausea, perianal/perineal pain, abdominal pain, rectal bleeding, abnormal CT scan demonstrating perianal phlegmon and concern for Crohn's disease  Plan:    Upper endoscopy and colonoscopy     HPI: Debbie [name] is a 33 y.o. female undergoing upper endoscopy and colonoscopy for investigation of nausea, perianal/perineal pain, abdominal pain, rectal bleeding and abnormal CT scan demonstrating perianal phlegmon and concern for Crohn's disease.  Patient first noted symptoms of perineal pain and rectal bleeding in June 2025.  Symptoms persisted and CT scan in July 2025 showed multiple areas of soft tissue attenuation in the bilateral ischial rectal fossa with surrounding soft tissue attenuation and inflammatory fat stranding suggestive of perineal phlegmonous change.  Tyrianna has noted improvement in symptoms with ciprofloxacin  and metronidazole .  Clinical findings are concerning for possible Crohn's disease which will be evaluated endoscopically.  She is also endorsed ongoing symptoms of nausea.  EGD will be performed to assess for upper GI tract Crohn's disease and investigate nausea.   Past Medical History:  Diagnosis Date   Anxiety    Depression    Lupus    Sjogren's syndrome (HCC)     Past Surgical History:  Procedure Laterality Date   CYSTOSCOPY     LAPAROSCOPIC BILATERAL SALPINGECTOMY N/A 02/13/2023   Procedure: LAPAROSCOPIC BILATERAL SALPINGECTOMY;  Surgeon: Erik Kieth BROCKS, MD;  Location: [facility];  Service: Gynecology;  Laterality: N/A;    Prior to Admission medications   Medication Sig Start Date End Date Taking? Authorizing Provider  cephALEXin  (KEFLEX ) 500 MG capsule Take 1 capsule (500 mg total) by mouth 4 (four) times daily. Patient not taking: Reported on 09/19/2023 09/14/23   Horton, Roxie HERO, DO  ciprofloxacin  (CIPRO ) 500  MG tablet Take 1 tablet (500 mg total) by mouth 2 (two) times daily. 10/06/23 11/08/23  Suzann Inocente HERO, MD  ciprofloxacin  (CIPRO ) 500 MG tablet Take 1 tablet (500 mg total) by mouth 2 (two) times daily. 10/06/23 11/05/23  Suzann Inocente HERO, MD  ketorolac  (TORADOL ) 10 MG tablet Take 1 tablet (10 mg total) by mouth every 6 (six) hours as needed (pain). 09/14/23   Horton, Kristie M, DO  levonorgestrel (LILETTA, 52 MG,) 20.1 MCG/DAY IUD IUD 1 each by Intrauterine route once.    [provider]  magnesium oxide (MAG-OX) 400 (240 Mg) MG tablet Take 400 mg by mouth daily.    [provider]  metroNIDAZOLE  (FLAGYL ) 500 MG tablet Take 1 tablet (500 mg total) by mouth 2 (two) times daily. 10/09/23   Suzann Inocente HERO, MD  ondansetron  (ZOFRAN -ODT) 8 MG disintegrating tablet Take 1 tablet (8 mg total) by mouth every 8 (eight) hours as needed for nausea or vomiting. 10/06/23   Aranda Bihm, Inocente HERO, MD  pantoprazole  (PROTONIX ) 40 MG tablet Take 1 tablet (40 mg total) by mouth daily. Take 20-30 minutes before meal. 09/19/23   Osiah Haring, Inocente HERO, MD  Sofpironium  Bromide (SOFDRA ) 12.45 % GEL Apply 1 Application topically at bedtime. Apply to both feet and between toes. 08/29/23   Alm Delon SAILOR, DO  traZODone  (DESYREL ) 50 MG tablet Take 50 mg by mouth at bedtime.    [provider]    Current Outpatient Medications  Medication Sig Dispense Refill   cephALEXin  (KEFLEX ) 500 MG capsule Take 1 capsule (500 mg total) by mouth 4 (four) times daily. (Patient not taking: Reported on 09/19/2023) 20 capsule 0  ciprofloxacin  (CIPRO ) 500 MG tablet Take 1 tablet (500 mg total) by mouth 2 (two) times daily. 60 tablet 0   ciprofloxacin  (CIPRO ) 500 MG tablet Take 1 tablet (500 mg total) by mouth 2 (two) times daily. 60 tablet 0   ketorolac  (TORADOL ) 10 MG tablet Take 1 tablet (10 mg total) by mouth every 6 (six) hours as needed (pain). 20 tablet 0   levonorgestrel (LILETTA, 52 MG,) 20.1 MCG/DAY IUD IUD 1 each by  Intrauterine route once.     magnesium oxide (MAG-OX) 400 (240 Mg) MG tablet Take 400 mg by mouth daily.     metroNIDAZOLE  (FLAGYL ) 500 MG tablet Take 1 tablet (500 mg total) by mouth 2 (two) times daily. 60 tablet 0   ondansetron  (ZOFRAN -ODT) 8 MG disintegrating tablet Take 1 tablet (8 mg total) by mouth every 8 (eight) hours as needed for nausea or vomiting. 20 tablet 1   pantoprazole  (PROTONIX ) 40 MG tablet Take 1 tablet (40 mg total) by mouth daily. Take 20-30 minutes before meal. 90 tablet 1   Sofpironium  Bromide (SOFDRA ) 12.45 % GEL Apply 1 Application topically at bedtime. Apply to both feet and between toes. 40.2 mL 4   traZODone  (DESYREL ) 50 MG tablet Take 50 mg by mouth at bedtime.     No current facility-administered medications for this visit.    Allergies as of 10/10/2023 - Unable to Assess 10/03/2023  Allergen Reaction Noted   Latex Rash 02/23/2021   Sulfamethoxazole-trimethoprim Rash and Swelling 11/15/2017    Family History  Problem Relation Age of Onset   Thyroid disease Mother    Cancer Maternal Grandmother    Cancer Paternal Grandfather    Hashimoto's thyroiditis Sister    Thyroid disease Sister    Thyroid disease Brother     Social History   Socioeconomic History   Marital status: Divorced    Spouse name: Not on file   Number of children: 0   Years of education: Not on file   Highest education level: Not on file  Occupational History   Occupation: Child psychotherapist  Tobacco Use   Smoking status: Some Days    Types: Cigarettes   Smokeless tobacco: Never   Tobacco comments:    Very rarely, social smoker  Vaping Use   Vaping status: Some Days  Substance and Sexual Activity   Alcohol use: Yes    Comment: Rarely   Drug use: Not Currently    Types: Marijuana   Sexual activity: Yes    Birth control/protection: I.U.D., Surgical  Other Topics Concern   Not on file  Social History Narrative   Not on file   Social Drivers of Health   Financial Resource  Strain: Not on file  Food Insecurity: Not on file  Transportation Needs: Not on file  Physical Activity: Not on file  Stress: Not on file  Social Connections: Not on file  Intimate Partner Violence: Not on file    Review of Systems:  All other review of systems negative except as mentioned in the HPI.  Physical Exam: Vital signs LMP  (LMP Unknown)   General:   Alert,  Well-developed, well-nourished, pleasant and cooperative in NAD Airway:  Mallampati  Lungs:  Clear throughout to auscultation.   Heart:  Regular rate and rhythm; no murmurs, clicks, rubs,  or gallops. Abdomen:  Soft, nontender and nondistended. Normal bowel sounds.   Neuro/Psych:  Normal mood and affect. A and O x 3  Inocente Hausen, MD [facility]

## 2023-10-10 ENCOUNTER — Other Ambulatory Visit: Payer: Self-pay

## 2023-10-10 ENCOUNTER — Inpatient Hospital Stay (HOSPITAL_COMMUNITY): Admitting: Registered Nurse

## 2023-10-10 ENCOUNTER — Inpatient Hospital Stay (HOSPITAL_BASED_OUTPATIENT_CLINIC_OR_DEPARTMENT_OTHER)
Admission: EM | Admit: 2023-10-10 | Discharge: 2023-10-11 | DRG: 345 | Disposition: A | Discharge status: Home / Self Care | Attending: Internal Medicine | Admitting: Internal Medicine

## 2023-10-10 ENCOUNTER — Encounter (HOSPITAL_COMMUNITY)
Admission: EM | Disposition: A | Discharge status: Home / Self Care | Payer: Self-pay | Source: Home / Self Care | Attending: Internal Medicine

## 2023-10-10 ENCOUNTER — Emergency Department (HOSPITAL_BASED_OUTPATIENT_CLINIC_OR_DEPARTMENT_OTHER)

## 2023-10-10 ENCOUNTER — Encounter: Admitting: Pediatrics

## 2023-10-10 ENCOUNTER — Encounter (HOSPITAL_BASED_OUTPATIENT_CLINIC_OR_DEPARTMENT_OTHER): Payer: Self-pay | Admitting: Emergency Medicine

## 2023-10-10 DIAGNOSIS — Z881 Allergy status to other antibiotic agents status: Secondary | ICD-10-CM

## 2023-10-10 DIAGNOSIS — K611 Rectal abscess: Secondary | ICD-10-CM | POA: Diagnosis not present

## 2023-10-10 DIAGNOSIS — G4700 Insomnia, unspecified: Secondary | ICD-10-CM | POA: Diagnosis present

## 2023-10-10 DIAGNOSIS — Z8349 Family history of other endocrine, nutritional and metabolic diseases: Secondary | ICD-10-CM

## 2023-10-10 DIAGNOSIS — I959 Hypotension, unspecified: Secondary | ICD-10-CM | POA: Diagnosis not present

## 2023-10-10 DIAGNOSIS — F419 Anxiety disorder, unspecified: Secondary | ICD-10-CM | POA: Diagnosis present

## 2023-10-10 DIAGNOSIS — Z882 Allergy status to sulfonamides status: Secondary | ICD-10-CM | POA: Diagnosis not present

## 2023-10-10 DIAGNOSIS — M329 Systemic lupus erythematosus, unspecified: Secondary | ICD-10-CM | POA: Diagnosis present

## 2023-10-10 DIAGNOSIS — K612 Anorectal abscess: Secondary | ICD-10-CM | POA: Diagnosis not present

## 2023-10-10 DIAGNOSIS — Z79899 Other long term (current) drug therapy: Secondary | ICD-10-CM

## 2023-10-10 DIAGNOSIS — Z975 Presence of (intrauterine) contraceptive device: Secondary | ICD-10-CM | POA: Diagnosis not present

## 2023-10-10 DIAGNOSIS — K6289 Other specified diseases of anus and rectum: Secondary | ICD-10-CM | POA: Diagnosis not present

## 2023-10-10 DIAGNOSIS — Z743 Need for continuous supervision: Secondary | ICD-10-CM | POA: Diagnosis not present

## 2023-10-10 DIAGNOSIS — K603 Anal fistula, unspecified: Secondary | ICD-10-CM

## 2023-10-10 DIAGNOSIS — Z91040 Latex allergy status: Secondary | ICD-10-CM

## 2023-10-10 DIAGNOSIS — F17210 Nicotine dependence, cigarettes, uncomplicated: Secondary | ICD-10-CM | POA: Diagnosis not present

## 2023-10-10 DIAGNOSIS — F32A Depression, unspecified: Secondary | ICD-10-CM | POA: Diagnosis not present

## 2023-10-10 DIAGNOSIS — F17290 Nicotine dependence, other tobacco product, uncomplicated: Secondary | ICD-10-CM | POA: Diagnosis present

## 2023-10-10 DIAGNOSIS — K50911 Crohn's disease, unspecified, with rectal bleeding: Secondary | ICD-10-CM | POA: Diagnosis present

## 2023-10-10 DIAGNOSIS — M3500 Sicca syndrome, unspecified: Secondary | ICD-10-CM | POA: Diagnosis present

## 2023-10-10 HISTORY — PX: INCISION AND DRAINAGE PERIRECTAL ABSCESS: SHX1804

## 2023-10-10 HISTORY — PX: RECTAL EXAM UNDER ANESTHESIA: SHX6399

## 2023-10-10 LAB — CBC WITH DIFFERENTIAL/PLATELET
Abs Immature Granulocytes: 0.06 K/uL (ref 0.00–0.07)
Basophils Absolute: 0 K/uL (ref 0.0–0.1)
Basophils Relative: 0 %
Eosinophils Absolute: 0 K/uL (ref 0.0–0.5)
Eosinophils Relative: 0 %
HCT: 36.9 % (ref 36.0–46.0)
Hemoglobin: 12.6 g/dL (ref 12.0–15.0)
Immature Granulocytes: 1 %
Lymphocytes Relative: 3 %
Lymphs Abs: 0.4 K/uL — ABNORMAL LOW (ref 0.7–4.0)
MCH: 29.2 pg (ref 26.0–34.0)
MCHC: 34.1 g/dL (ref 30.0–36.0)
MCV: 85.4 fL (ref 80.0–100.0)
Monocytes Absolute: 0.6 K/uL (ref 0.1–1.0)
Monocytes Relative: 5 %
Neutro Abs: 11.6 K/uL — ABNORMAL HIGH (ref 1.7–7.7)
Neutrophils Relative %: 91 %
Platelets: 217 K/uL (ref 150–400)
RBC: 4.32 MIL/uL (ref 3.87–5.11)
RDW: 12.9 % (ref 11.5–15.5)
WBC: 12.6 K/uL — ABNORMAL HIGH (ref 4.0–10.5)
nRBC: 0 % (ref 0.0–0.2)

## 2023-10-10 LAB — BASIC METABOLIC PANEL WITH GFR
Anion gap: 14 (ref 5–15)
BUN: 8 mg/dL (ref 6–20)
CO2: 21 mmol/L — ABNORMAL LOW (ref 22–32)
Calcium: 9.3 mg/dL (ref 8.9–10.3)
Chloride: 102 mmol/L (ref 98–111)
Creatinine, Ser: 1.13 mg/dL — ABNORMAL HIGH (ref 0.44–1.00)
GFR, Estimated: >60 mL/min (ref >60)
Glucose, Bld: 121 mg/dL — ABNORMAL HIGH (ref 70–99)
Potassium: 3.5 mmol/L (ref 3.5–5.1)
Sodium: 137 mmol/L (ref 135–145)

## 2023-10-10 LAB — HCG, SERUM, QUALITATIVE: Preg, Serum: NEGATIVE

## 2023-10-10 SURGERY — INCISION AND DRAINAGE, ABSCESS, PERIRECTAL
Anesthesia: General | Site: Rectum

## 2023-10-10 MED ORDER — DEXAMETHASONE SODIUM PHOSPHATE 10 MG/ML IJ SOLN
INTRAMUSCULAR | Status: DC | PRN
  Administered 2023-10-10: 10 mg via INTRAVENOUS

## 2023-10-10 MED ORDER — EPHEDRINE SULFATE-NACL 50-0.9 MG/10ML-% IV SOSY
PREFILLED_SYRINGE | INTRAVENOUS | Status: DC | PRN
  Administered 2023-10-10: 2.5 mg via INTRAVENOUS

## 2023-10-10 MED ORDER — PROPOFOL 10 MG/ML IV BOLUS
INTRAVENOUS | Status: DC | PRN
  Administered 2023-10-10: 200 mg via INTRAVENOUS

## 2023-10-10 MED ORDER — CHLORHEXIDINE GLUCONATE 0.12 % MT SOLN
OROMUCOSAL | Status: AC
  Administered 2023-10-10: 15 mL via OROMUCOSAL
  Filled 2023-10-10: qty 15

## 2023-10-10 MED ORDER — BUPIVACAINE-EPINEPHRINE 0.25% -1:200000 IJ SOLN
INTRAMUSCULAR | Status: DC | PRN
  Administered 2023-10-10: 5 mL

## 2023-10-10 MED ORDER — FENTANYL CITRATE (PF) 250 MCG/5ML IJ SOLN
INTRAMUSCULAR | Status: AC
  Filled 2023-10-10: qty 5

## 2023-10-10 MED ORDER — IOHEXOL 300 MG/ML  SOLN
100.0000 mL | Freq: Once | INTRAMUSCULAR | Status: AC | PRN
  Administered 2023-10-10: 100 mL via INTRAVENOUS

## 2023-10-10 MED ORDER — MIDAZOLAM HCL 2 MG/2ML IJ SOLN
INTRAMUSCULAR | Status: DC | PRN
  Administered 2023-10-10: 2 mg via INTRAVENOUS

## 2023-10-10 MED ORDER — BUPIVACAINE-EPINEPHRINE (PF) 0.25% -1:200000 IJ SOLN
INTRAMUSCULAR | Status: AC
  Filled 2023-10-10: qty 30

## 2023-10-10 MED ORDER — ACETAMINOPHEN 325 MG PO TABS: 650.0000 mg | ORAL_TABLET | Freq: Four times a day (QID) | ORAL | Status: DC | PRN

## 2023-10-10 MED ORDER — PIPERACILLIN-TAZOBACTAM 3.375 G IVPB 30 MIN
3.3750 g | Freq: Once | INTRAVENOUS | Status: AC
  Administered 2023-10-10: 3.375 g via INTRAVENOUS
  Filled 2023-10-10 (×2): qty 50

## 2023-10-10 MED ORDER — PROPOFOL 10 MG/ML IV BOLUS
INTRAVENOUS | Status: AC
  Filled 2023-10-10: qty 20

## 2023-10-10 MED ORDER — SENNOSIDES-DOCUSATE SODIUM 8.6-50 MG PO TABS: 1.0000 | ORAL_TABLET | Freq: Every evening | ORAL | Status: DC | PRN

## 2023-10-10 MED ORDER — OXYCODONE HCL 5 MG/5ML PO SOLN: 5.0000 mg | Freq: Once | ORAL | Status: DC | PRN

## 2023-10-10 MED ORDER — ORAL CARE MOUTH RINSE: 15.0000 mL | Freq: Once | OROMUCOSAL | Status: AC

## 2023-10-10 MED ORDER — ONDANSETRON HCL 4 MG/2ML IJ SOLN: 4.0000 mg | Freq: Four times a day (QID) | INTRAMUSCULAR | Status: DC | PRN

## 2023-10-10 MED ORDER — LACTATED RINGERS IV SOLN: INTRAVENOUS | Status: DC

## 2023-10-10 MED ORDER — HYDROCODONE-ACETAMINOPHEN 5-325 MG PO TABS
1.0000 | ORAL_TABLET | ORAL | Status: DC | PRN
  Administered 2023-10-11: 2 via ORAL
  Administered 2023-10-11: 1 via ORAL
  Filled 2023-10-10: qty 2
  Filled 2023-10-10: qty 1

## 2023-10-10 MED ORDER — OXYCODONE HCL 5 MG PO TABS: 5.0000 mg | ORAL_TABLET | Freq: Once | ORAL | Status: DC | PRN

## 2023-10-10 MED ORDER — HYDRALAZINE HCL 20 MG/ML IJ SOLN: 10.0000 mg | Freq: Four times a day (QID) | INTRAMUSCULAR | Status: DC | PRN

## 2023-10-10 MED ORDER — FENTANYL CITRATE PF 50 MCG/ML IJ SOSY
50.0000 ug | PREFILLED_SYRINGE | Freq: Once | INTRAMUSCULAR | Status: AC
  Administered 2023-10-10: 50 ug via INTRAVENOUS
  Filled 2023-10-10: qty 1

## 2023-10-10 MED ORDER — MEPERIDINE HCL 25 MG/ML IJ SOLN: 6.2500 mg | INTRAMUSCULAR | Status: DC | PRN

## 2023-10-10 MED ORDER — LIDOCAINE 2% (20 MG/ML) 5 ML SYRINGE
INTRAMUSCULAR | Status: DC | PRN
  Administered 2023-10-10: 60 mg via INTRAVENOUS

## 2023-10-10 MED ORDER — HYDROMORPHONE HCL 1 MG/ML IJ SOLN
INTRAMUSCULAR | Status: AC
  Filled 2023-10-10: qty 1

## 2023-10-10 MED ORDER — AMISULPRIDE (ANTIEMETIC) 5 MG/2ML IV SOLN: 10.0000 mg | Freq: Once | INTRAVENOUS | Status: DC | PRN

## 2023-10-10 MED ORDER — FENTANYL CITRATE (PF) 250 MCG/5ML IJ SOLN
INTRAMUSCULAR | Status: DC | PRN
  Administered 2023-10-10 (×5): 50 ug via INTRAVENOUS

## 2023-10-10 MED ORDER — CHLORHEXIDINE GLUCONATE 0.12 % MT SOLN: 15.0000 mL | Freq: Once | OROMUCOSAL | Status: AC

## 2023-10-10 MED ORDER — SODIUM CHLORIDE 0.9 % IV SOLN: 12.5000 mg | INTRAVENOUS | Status: DC | PRN

## 2023-10-10 MED ORDER — ACETAMINOPHEN 650 MG RE SUPP: 650.0000 mg | Freq: Four times a day (QID) | RECTAL | Status: DC | PRN

## 2023-10-10 MED ORDER — DEXMEDETOMIDINE HCL IN NACL 80 MCG/20ML IV SOLN
INTRAVENOUS | Status: DC | PRN
  Administered 2023-10-10 (×2): 10 ug via INTRAVENOUS

## 2023-10-10 MED ORDER — HYDROMORPHONE HCL 1 MG/ML IJ SOLN
0.2500 mg | INTRAMUSCULAR | Status: DC | PRN
  Administered 2023-10-10 (×2): 0.5 mg via INTRAVENOUS

## 2023-10-10 MED ORDER — MIDAZOLAM HCL 2 MG/2ML IJ SOLN
INTRAMUSCULAR | Status: AC
  Filled 2023-10-10: qty 2

## 2023-10-10 MED ORDER — ONDANSETRON HCL 4 MG PO TABS: 4.0000 mg | ORAL_TABLET | Freq: Four times a day (QID) | ORAL | Status: DC | PRN

## 2023-10-10 MED ORDER — TRAZODONE HCL 50 MG PO TABS: 50.0000 mg | ORAL_TABLET | Freq: Every evening | ORAL | Status: DC | PRN

## 2023-10-10 MED ORDER — PIPERACILLIN-TAZOBACTAM 3.375 G IVPB
3.3750 g | Freq: Three times a day (TID) | INTRAVENOUS | Status: DC
  Administered 2023-10-10 – 2023-10-11 (×2): 3.375 g via INTRAVENOUS
  Filled 2023-10-10 (×4): qty 50

## 2023-10-10 MED ORDER — 0.9 % SODIUM CHLORIDE (POUR BTL) OPTIME
TOPICAL | Status: DC | PRN
  Administered 2023-10-10: 1000 mL

## 2023-10-10 MED ORDER — BISACODYL 5 MG PO TBEC: 5.0000 mg | DELAYED_RELEASE_TABLET | Freq: Every day | ORAL | Status: DC | PRN

## 2023-10-10 MED ORDER — ONDANSETRON HCL 4 MG/2ML IJ SOLN
INTRAMUSCULAR | Status: DC | PRN
  Administered 2023-10-10: 4 mg via INTRAVENOUS

## 2023-10-10 MED ORDER — METHOCARBAMOL 1000 MG/10ML IJ SOLN: 500.0000 mg | Freq: Four times a day (QID) | INTRAMUSCULAR | Status: DC | PRN

## 2023-10-10 MED ORDER — SODIUM CHLORIDE 0.9 % IV BOLUS
500.0000 mL | Freq: Once | INTRAVENOUS | Status: AC
  Administered 2023-10-10: 500 mL via INTRAVENOUS

## 2023-10-10 SURGICAL SUPPLY — 20 items
BAG COUNTER SPONGE SURGICOUNT (BAG) ×1 IMPLANT
COVER MAYO STAND STRL (DRAPES) ×1 IMPLANT
COVER SURGICAL LIGHT HANDLE (MISCELLANEOUS) ×1 IMPLANT
ELECT CAUTERY BLADE 6.4 (BLADE) ×1 IMPLANT
ELECTRODE REM PT RTRN 9FT ADLT (ELECTROSURGICAL) ×1 IMPLANT
GAUZE PACKING IODOFORM 1/2INX (GAUZE/BANDAGES/DRESSINGS) IMPLANT
GAUZE PAD ABD 7.5X8 STRL (GAUZE/BANDAGES/DRESSINGS) IMPLANT
GAUZE SPONGE 4X4 12PLY STRL (GAUZE/BANDAGES/DRESSINGS) IMPLANT
GLOVE BIOGEL PI MICRO STRL 7 (GLOVE) IMPLANT
GOWN STRL REUS W/ TWL LRG LVL3 (GOWN DISPOSABLE) ×2 IMPLANT
KIT BASIN OR (CUSTOM PROCEDURE TRAY) ×1 IMPLANT
KIT TURNOVER KIT B (KITS) ×1 IMPLANT
NS IRRIG 1000ML POUR BTL (IV SOLUTION) ×1 IMPLANT
PACK GENERAL/GYN (CUSTOM PROCEDURE TRAY) IMPLANT
PACK LITHOTOMY IV (CUSTOM PROCEDURE TRAY) IMPLANT
PAD ARMBOARD POSITIONER FOAM (MISCELLANEOUS) ×1 IMPLANT
SURGILUBE 2OZ TUBE FLIPTOP (MISCELLANEOUS) ×1 IMPLANT
SYR BULB EAR ULCER 3OZ GRN STR (SYRINGE) ×1 IMPLANT
TOWEL GREEN STERILE (TOWEL DISPOSABLE) ×1 IMPLANT
TOWEL GREEN STERILE FF (TOWEL DISPOSABLE) ×1 IMPLANT

## 2023-10-10 NOTE — Anesthesia Procedure Notes (Signed)
 Procedure Name: LMA Insertion Date/Time: 10/10/2023 2:31 PM  Performed by: Virgil Ee, CRNAPre-anesthesia Checklist: Patient identified, Patient being monitored, Timeout performed, Emergency Drugs available and Suction available Patient Re-evaluated:Patient Re-evaluated prior to induction Oxygen Delivery Method: Circle system utilized Preoxygenation: Pre-oxygenation with 100% oxygen Induction Type: IV induction Ventilation: Mask ventilation without difficulty LMA: LMA inserted LMA Size: 3.0 Tube type: Oral Number of attempts: 1 Placement Confirmation: positive ETCO2 and breath sounds checked- equal and bilateral Tube secured with: Tape Dental Injury: Teeth and Oropharynx as per pre-operative assessment

## 2023-10-10 NOTE — ED Provider Notes (Signed)
 Care assumed from Dr. Geroldine.  At time of transfer care, patient is awaiting callback with recommendations from gastroenterology given her complex history and her plans for evaluation later today.  Previous team suspected she may require admission for further workup and management.  7:33 AM Spoke to Dr. Albertus with GI who called back.  They requested a CT pelvis with contrast to look for rectal abscess.  If there is abscess, she would need admission, surgery consult, GI consult to see her as an inpatient.  If there is no abscess, she will be able to be discharged and stop her bowel prep and be able to follow-up at 2 PM for her appointment and procedure.  Patient and family agree with this plan.  Will give her some more pain medicine while she waits for imaging.  10:20 AM CT scan does show formation of abscess.  I spoke to general surgery who will see her in consultation but we will plan for medicine admission given GIs desire for involvement as well.  Will send a message to Dr. Albertus and if he does not respond we will consult the [facility] team today.  Will plan for admission to [facility] for further management.  10:49 AM Spoke to [facility] who agrees with consultation and they will be involved.  General surgery will see.  Medicine will admit.  Patient requests more pain medicines will give this to her.   Clinical Impression: 1. Rectal abscess     Disposition: Admit  This note was prepared with assistance of Dragon voice recognition software. Occasional wrong-word or sound-a-like substitutions may have occurred due to the inherent limitations of voice recognition software.       Zephaniah Lubrano, Lonni PARAS, MD 10/10/23 1050

## 2023-10-10 NOTE — Anesthesia Postprocedure Evaluation (Signed)
 Anesthesia Post Note  Patient: Debbie [name]  Procedure(s) Performed: INCISION AND DRAINAGE, ABSCESS, PERIRECTAL (Rectum) EXAM UNDER ANESTHESIA, RECTUM (Rectum)     Patient location during evaluation: PACU Anesthesia Type: General Level of consciousness: awake and alert Pain management: pain level controlled Vital Signs Assessment: post-procedure vital signs reviewed and stable Respiratory status: spontaneous breathing, nonlabored ventilation and respiratory function stable Cardiovascular status: blood pressure returned to baseline and stable Postop Assessment: no apparent nausea or vomiting Anesthetic complications: no   No notable events documented.  Last Vitals:  Vitals:   10/10/23 1600 10/10/23 1617  BP: 107/69 104/75  Pulse: 65   Resp: 20   Temp: 36.7 C 36.9 C  SpO2: 96% 100%    Last Pain:  Vitals:   10/10/23 1617  TempSrc: Oral  PainSc: 0-No pain                 Butler Levander Pinal

## 2023-10-10 NOTE — ED Notes (Signed)
 Report given to the Floor RN at Short Stay

## 2023-10-10 NOTE — ED Triage Notes (Signed)
  Patient comes in with rectal pain that started earlier this evening.  Patient states she is schedule for a coloscopy this morning and was doing the bowel prep and started having pain/inflammation at her rectum.  Denies any bleeding.  Took 1000 mg tylenol  around 2200.  Pain 10/10, sharp/throbbing.

## 2023-10-10 NOTE — Anesthesia Preprocedure Evaluation (Signed)
 Anesthesia Evaluation  Patient identified by MRN, date of birth, ID band Patient awake    Reviewed: Allergy & Precautions, NPO status , Patient's Chart, lab work & pertinent test results  Airway Mallampati: II       Dental no notable dental hx. (+) Dental Advisory Given   Pulmonary Current Smoker and Patient abstained from smoking.   Pulmonary exam normal breath sounds clear to auscultation       Cardiovascular negative cardio ROS Normal cardiovascular exam Rhythm:Regular Rate:Normal     Neuro/Psych  PSYCHIATRIC DISORDERS Anxiety Depression    negative neurological ROS     GI/Hepatic negative GI ROS, Neg liver ROS,,,  Endo/Other  negative endocrine ROS    Renal/GU negative Renal ROS     Musculoskeletal SLE Sjogren's syndrome   Abdominal   Peds  Hematology negative hematology ROS (+)   Anesthesia Other Findings   Reproductive/Obstetrics Undesired fertility                              Anesthesia Physical Anesthesia Plan  ASA: 2  Anesthesia Plan: General   Post-op Pain Management:    Induction: Intravenous  PONV Risk Score and Plan: 2 and Treatment may vary due to age or medical condition, Midazolam  and Ondansetron   Airway Management Planned: LMA  Additional Equipment: None  Intra-op Plan:   Post-operative Plan: Extubation in OR  Informed Consent: I have reviewed the patients History and Physical, chart, labs and discussed the procedure including the risks, benefits and alternatives for the proposed anesthesia with the patient or authorized representative who has indicated his/her understanding and acceptance.     Dental advisory given  Plan Discussed with: CRNA and Anesthesiologist  Anesthesia Plan Comments:         Anesthesia Quick Evaluation

## 2023-10-10 NOTE — Progress Notes (Signed)
 Pharmacy Antibiotic Note  Debbie [name] is a 33 y.o. female admitted on 10/10/2023 with rectal abscess.  Pharmacy has been consulted for zosyn  dosing.  Plan: Zosyn  3.375g IV q8h (4 hour infusion).  Height: 5' 6 (167.6 cm) Weight: 61.2 kg (135 lb) IBW/kg (Calculated) : 59.3  Temp (24hrs), Avg:99 F (37.2 C), Min:98.1 F (36.7 C), Max:99.9 F (37.7 C)  Recent Labs  Lab 10/10/23 0632  WBC 12.6*  CREATININE 1.13*    Estimated Creatinine Clearance: 67.5 mL/min (A) (by C-G formula based on SCr of 1.13 mg/dL (H)).    Allergies  Allergen Reactions   Latex Rash   Sulfamethoxazole-Trimethoprim Rash and Swelling    Thank you for allowing pharmacy to be a part of this patient's care.  Debbie [name] Debbie [name] 10/10/2023 11:59 AM

## 2023-10-10 NOTE — H&P (Signed)
 History and Physical    Patient: Debbie [name] FMW:[id number] DOB: March 22, 1990 DOA: 10/10/2023 DOS: the patient was seen and examined on 10/10/2023 PCP: Allwardt, Mardy HERO, PA-C  Patient coming from: Home  Chief Complaint:  Chief Complaint  Patient presents with   Rectal Pain   HPI: Debbie [name] is a 33 y.o. female with medical history significant of anxiety, depression, Lupus, Sjogren's syndrome, who initially presented to [facility] for perirectal pain.  She said that her symptoms initially started beginning of June.  She was seen by OB/GYN at that time and they did not think that it was an OB/GYN issue.   She was later seen by GI and was prescribed 2-week course of ciprofloxacin  and Flagyl  and the plan was to extend the course to 1 month.   She said that for the past few days, her symptoms of pain and swelling got worse to the point that she could not get comfortable, so she decided to come to the emergency room for further evaluation.  Regarding her history, she told me that she has seen rheumatologist in the past and they are unsure whether she has lupus or Sjogren's syndrome.  She had diffuse joint pains but had no organ involvement.  She was prescribed hydroxychloroquine  at one point but she decided to discontinue it based on risk versus benefits at this time and she thinks that if her symptoms come back or get worse, she would consider going back on hydroxychloroquine .  He does take trazodone  to help with her sleep every night 50 mg.   She was sent from [facility] directly to OR and underwent incision and drainage of b/l perirectal abscesses.   She was seen and examined in room 5M14 s/p I&D of perirectal abscess. Her mother was present at the bedside. Pain is well controlled after dilaudid .  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  Review of Systems: As mentioned in the history of present illness. All other systems reviewed and  are negative. Past Medical History:  Diagnosis Date   Anxiety    Depression    Lupus    Sjogren's syndrome (HCC)    Past Surgical History:  Procedure Laterality Date   CYSTOSCOPY     LAPAROSCOPIC BILATERAL SALPINGECTOMY N/A 02/13/2023   Procedure: LAPAROSCOPIC BILATERAL SALPINGECTOMY;  Surgeon: Erik Kieth BROCKS, MD;  Location: [facility];  Service: Gynecology;  Laterality: N/A;   Social History:  reports that she has been smoking cigarettes. She has never used smokeless tobacco. She reports current alcohol use. She reports that she does not currently use drugs after having used the following drugs: Marijuana.  Allergies  Allergen Reactions   Latex Rash   Sulfamethoxazole-Trimethoprim Rash and Swelling    Family History  Problem Relation Age of Onset   Thyroid disease Mother    Cancer Maternal Grandmother    Cancer Paternal Grandfather    Hashimoto's thyroiditis Sister    Thyroid disease Sister    Thyroid disease Brother     Prior to Admission medications   Medication Sig Start Date End Date Taking? Authorizing Provider  acetaminophen  (TYLENOL ) 500 MG tablet Take 1,000 mg by mouth as needed.   Yes [provider]  ciprofloxacin  (CIPRO ) 500 MG tablet Take 1 tablet (500 mg total) by mouth 2 (two) times daily. 10/06/23 11/08/23 Yes McGreal, Inocente HERO, MD  levonorgestrel (LILETTA, 52 MG,) 20.1 MCG/DAY IUD IUD 1 each by Intrauterine route  once.   Yes [provider]  metroNIDAZOLE  (FLAGYL ) 500 MG tablet Take 1 tablet (500 mg total) by mouth 2 (two) times daily. 10/09/23  Yes McGreal, Inocente HERO, MD  Misc Natural Products (TURMERIC, CURCUMIN, PO) Take 1 tablet by mouth daily.   Yes [provider]  Omega-3 Fatty Acids (FISH OIL PO) Take 2 tablets by mouth daily.   Yes [provider]  ondansetron  (ZOFRAN -ODT) 8 MG disintegrating tablet Take 1 tablet (8 mg total) by mouth every 8 (eight) hours as needed for nausea or vomiting. 10/06/23   Yes McGreal, Inocente HERO, MD  pantoprazole  (PROTONIX ) 40 MG tablet Take 1 tablet (40 mg total) by mouth daily. Take 20-30 minutes before meal. 09/19/23  Yes McGreal, Inocente HERO, MD  Sofpironium  Bromide (SOFDRA ) 12.45 % GEL Apply 1 Application topically at bedtime. Apply to both feet and between toes. 08/29/23  Yes Alm Delon SAILOR, DO  traZODone  (DESYREL ) 50 MG tablet Take 50 mg by mouth at bedtime.   Yes [provider]  cephALEXin  (KEFLEX ) 500 MG capsule Take 1 capsule (500 mg total) by mouth 4 (four) times daily. Patient not taking: No sig reported 09/14/23   Horton, Kristie M, DO  ketorolac  (TORADOL ) 10 MG tablet Take 1 tablet (10 mg total) by mouth every 6 (six) hours as needed (pain). Patient not taking: Reported on 10/10/2023 09/14/23   Bari Roxie HERO, DO    Physical Exam: Vitals:   10/10/23 1530 10/10/23 1545 10/10/23 1600 10/10/23 1617  BP: 109/71 111/81 107/69 104/75  Pulse: 75 72 65   Resp: 15 17 20    Temp: 98.1 F (36.7 C)  98 F (36.7 C) 98.5 F (36.9 C)  TempSrc:    Oral  SpO2: 100% 98% 96% 100%  Weight:      Height:       Constitutional: NAD, calm, comfortable Eyes: PERRL, lids and conjunctivae normal ENMT: Mucous membranes are moist. Posterior pharynx clear of any exudate or lesions.Normal dentition.  Neck: normal, supple, no masses, no thyromegaly Respiratory: clear to auscultation bilaterally, no wheezing, no crackles. Normal respiratory effort. No accessory muscle use.  Cardiovascular: Regular rate and rhythm, no murmurs / rubs / gallops. No extremity edema. 2+ pedal pulses. No carotid bruits.  Abdomen: no tenderness, no masses palpated. No hepatosplenomegaly. Bowel sounds positive.  Musculoskeletal: no clubbing / cyanosis. No joint deformity upper and lower extremities. Good ROM, no contractures. Normal muscle tone.  Skin: no rashes, lesions, ulcers. No induration Neurologic: CN 2-12 grossly intact. Sensation intact, DTR normal. Strength 5/5 x all 4 extremities.   Psychiatric: Normal judgment and insight. Alert and oriented x 3. Normal mood.   Data Reviewed:  There are no new results to review at this time.  Assessment and Plan: No notes have been filed under this hospital service. Service: Hospitalist  Perirectal abscess,POA: -s/p I&D done by general surgery today -prn analgesics -Received IVF -Started on a regular diet -Continue with IV zosyn   Lupus: Out patient follow up  Sjogren's syndrome: Out patient follow up.  Insomnia: On Trazodone  50 mg at bedtime  Disposition: Home. IADL. 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   Advance Care Planning:   Code Status: Not on file Full code  Consults: General surgery  Family Communication: None at the bedside  Severity of Illness: The appropriate patient status for this patient is INPATIENT. Inpatient status is judged to be reasonable and necessary in order to provide the required intensity of service to ensure the patient's safety.  The patient's presenting symptoms, physical exam findings, and initial radiographic and laboratory data in the context of their chronic comorbidities is felt to place them at high risk for further clinical deterioration. Furthermore, it is not anticipated that the patient will be medically stable for discharge from the hospital within 2 midnights of admission.   * I certify that at the point of admission it is my clinical judgment that the patient will require inpatient hospital care spanning beyond 2 midnights from the point of admission due to high intensity of service, high risk for further deterioration and high frequency of surveillance required.*  Author: Deliliah Room, MD 10/10/2023 4:32 PM  For on call review [email or web address].

## 2023-10-10 NOTE — ED Provider Notes (Signed)
 [place] EMERGENCY DEPARTMENT AT [facility] Provider Note   CSN: [id number] Arrival date & time: 10/10/23  0413     Patient presents with: Rectal Pain   Debbie [name] is a 33 y.o. female.  {Add pertinent medical, surgical, social history, OB history to YEP:67052} Patient is a 33 year old female presenting with complaints of rectal pain.  Patient has been having issues with perirectal swelling and pain for several weeks.  She has had a CT scan showing phlegmonous changes, then recently had an MRI that has not been read yet.  She was scheduled for colonoscopy with [facility] for this morning.  While drinking the colonoscopy prep and having bowel movements throughout the night, her perirectal tissues have become very irritated and inflamed.  She denies any fevers or chills.  She does have pain with bowel movements and wiping.       Prior to Admission medications   Medication Sig Start Date End Date Taking? Authorizing Provider  cephALEXin  (KEFLEX ) 500 MG capsule Take 1 capsule (500 mg total) by mouth 4 (four) times daily. Patient not taking: Reported on 09/19/2023 09/14/23   Horton, Roxie M, DO  ciprofloxacin  (CIPRO ) 500 MG tablet Take 1 tablet (500 mg total) by mouth 2 (two) times daily. 10/06/23 11/08/23  Suzann Inocente HERO, MD  ciprofloxacin  (CIPRO ) 500 MG tablet Take 1 tablet (500 mg total) by mouth 2 (two) times daily. 10/06/23 11/05/23  Suzann Inocente HERO, MD  ketorolac  (TORADOL ) 10 MG tablet Take 1 tablet (10 mg total) by mouth every 6 (six) hours as needed (pain). 09/14/23   Horton, Kristie M, DO  levonorgestrel (LILETTA, 52 MG,) 20.1 MCG/DAY IUD IUD 1 each by Intrauterine route once.    [provider]  magnesium oxide (MAG-OX) 400 (240 Mg) MG tablet Take 400 mg by mouth daily.    [provider]  metroNIDAZOLE  (FLAGYL ) 500 MG tablet Take 1 tablet (500 mg total) by mouth 2 (two) times daily. 10/09/23   Suzann Inocente HERO, MD  ondansetron  (ZOFRAN -ODT) 8 MG  disintegrating tablet Take 1 tablet (8 mg total) by mouth every 8 (eight) hours as needed for nausea or vomiting. 10/06/23   McGreal, Inocente HERO, MD  pantoprazole  (PROTONIX ) 40 MG tablet Take 1 tablet (40 mg total) by mouth daily. Take 20-30 minutes before meal. 09/19/23   McGreal, Inocente HERO, MD  Sofpironium  Bromide (SOFDRA ) 12.45 % GEL Apply 1 Application topically at bedtime. Apply to both feet and between toes. 08/29/23   Alm Delon SAILOR, DO  traZODone  (DESYREL ) 50 MG tablet Take 50 mg by mouth at bedtime.    [provider]    Allergies: Latex and Sulfamethoxazole-trimethoprim    Review of Systems  All other systems reviewed and are negative.   Updated Vital Signs BP 125/88 (BP Location: Right Arm)   Pulse (!) 136   Temp 98.1 F (36.7 C)   Resp 20   Ht 5' 6 (1.676 m)   Wt 61.2 kg   LMP  (LMP Unknown)   SpO2 98%   BMI 21.79 kg/m   Physical Exam Vitals and nursing note reviewed.  Constitutional:      Appearance: Normal appearance.  Pulmonary:     Effort: Pulmonary effort is normal.  Abdominal:     General: There is no distension.     Tenderness: There is no abdominal tenderness.  Genitourinary:    Comments: The perianal soft tissues are indurated and mildly erythematous circumferentially.  They are exquisitely tender to the touch.  Skin:    General: Skin is warm and dry.  Neurological:     Mental Status: She is alert and oriented to person, place, and time.     (all labs ordered are listed, but only abnormal results are displayed) Labs Reviewed - No data to display  EKG: None  Radiology: No results found.  {Document cardiac monitor, telemetry assessment procedure when appropriate:32947} Procedures   Medications Ordered in the ED - No data to display    {Click here for ABCD2, HEART and other calculators REFRESH Note before signing:1}                              Medical Decision Making  ***  {Document critical care time when appropriate  Document  review of labs and clinical decision tools ie CHADS2VASC2, etc  Document your independent review of radiology images and any outside records  Document your discussion with family members, caretakers and with consultants  Document social determinants of health affecting pt's care  Document your decision making why or why not admission, treatments were needed:32947:::1}   Final diagnoses:  None    ED Discharge Orders     None

## 2023-10-10 NOTE — Consult Note (Signed)
 Debbie [name] November 28, 1990  [id number].    Requesting MD: Dr. Lonni Tegeler Chief Complaint/Reason for Consult: perianal abscess  HPI:  This is a 33 yo female with a history of Anxiety/depression, Lupus, Sjogren's syndrome who has been having perianal pain, mild rectal bleeding, and some intermittent abdominal pain since June of this year.  She was seen in an UC with no rectal findings, except possible internal hemorrhoids and given Toradol .  She was later seen by her PCP with a vaginal and rectal exam which were normal.  She was then seen by OB/GYN at the end of June and felt this was maybe MSK in origin.  She then presented to the ED on 7/23 with persistent worsening pain.  Her labs were unremarkable, but a CT showing multiple areas of soft tissue attenuation in the B ischial rectal fossa with soft tissue attenuation and inflammation suggesting B phlegmons.  She was then given a course of Keflex  and referred to GI.  She saw Dr. McGreat in late July.  She was started on Cipro /Flagyl  for 14 days and an MRI enterography was completed, but protocol wasn't accurate and so results are inconclusive for determining if patient has crohn's disease.  Plans were for EGD and colonoscopy to be arranged this am but  after the prep her perirectal pain worsened with with severe inflammation to her perirectal tissues. She presented to the ED for evaluation.  She has undergone a new CT scan that revealed a bilobed perianal fluid collection with gas that are CONNECTED to the anterior rectum.  She is being admitted to the medical service with GI consultation.  We have been asked to see her as well for intervention for this abscess.  Patient denies tobacco, alcohol, drug use. She works as a Child psychotherapist at [employer]. Lives with a roommate but is currently staying with her mother.   ROS: ROS: see HPI  Family History  Problem Relation Age of Onset   Thyroid disease Mother    Cancer Maternal Grandmother     Cancer Paternal Grandfather    Hashimoto's thyroiditis Sister    Thyroid disease Sister    Thyroid disease Brother     Past Medical History:  Diagnosis Date   Anxiety    Depression    Lupus    Sjogren's syndrome (HCC)     Past Surgical History:  Procedure Laterality Date   CYSTOSCOPY     LAPAROSCOPIC BILATERAL SALPINGECTOMY N/A 02/13/2023   Procedure: LAPAROSCOPIC BILATERAL SALPINGECTOMY;  Surgeon: Erik Kieth BROCKS, MD;  Location: [facility];  Service: Gynecology;  Laterality: N/A;    Social History:  reports that she has been smoking cigarettes. She has never used smokeless tobacco. She reports current alcohol use. She reports that she does not currently use drugs after having used the following drugs: Marijuana.  Allergies:  Allergies  Allergen Reactions   Latex Rash   Sulfamethoxazole-Trimethoprim Rash and Swelling    (Not in a hospital admission)    Physical Exam: Blood pressure (!) 93/59, pulse 90, temperature 99.9 F (37.7 C), temperature source Oral, resp. rate 16, height 5' 6 (1.676 m), weight 61.2 kg, SpO2 100%.  General: pleasant, WD, WN white female who is laying in bed in NAD HEENT: head is normocephalic, atraumatic.  Sclera are noninjected.  PERRL.  Ears and nose without any masses or lesions.  Mouth is pink and moist Heart: regular, rate, and rhythm.  Normal s1,s2. No obvious murmurs, gallops, or rubs noted.  Palpable radial and pedal pulses bilaterally Lungs: CTAB, no wheezes, rhonchi, or rales noted.  Respiratory effort nonlabored Abd: soft, NT, ND, +BS, no masses, hernias, or organomegaly GU: there is erythema of the perianal and medial gluteal tissues bilaterally, palpable fluctuance over the left buttock, significant edema.  MS: all 4 extremities are symmetrical with no cyanosis, clubbing, or edema. Skin: warm and dry with no masses, lesions, or rashes Neuro: Cranial nerves 2-12 grossly intact, sensation is normal throughout Psych:  A&Ox3 with an appropriate affect.   Results for orders placed or performed during the hospital encounter of 10/10/23 (from the past 48 hours)  Basic metabolic panel     Status: Abnormal   Collection Time: 10/10/23  6:32 AM  Result Value Ref Range   Sodium 137 135 - 145 mmol/L   Potassium 3.5 3.5 - 5.1 mmol/L   Chloride 102 98 - 111 mmol/L   CO2 21 (L) 22 - 32 mmol/L   Glucose, Bld 121 (H) 70 - 99 mg/dL    Comment: Glucose reference range applies only to samples taken after fasting for at least 8 hours.   BUN 8 6 - 20 mg/dL   Creatinine, Ser 8.86 (H) 0.44 - 1.00 mg/dL   Calcium 9.3 8.9 - 89.6 mg/dL   GFR, Estimated >39 >39 mL/min    Comment: (NOTE) Calculated using the CKD-EPI Creatinine Equation (2021)    Anion gap 14 5 - 15    Comment: Performed at [facility], [address], [place], [place] [address]  CBC with Differential     Status: Abnormal   Collection Time: 10/10/23  6:32 AM  Result Value Ref Range   WBC 12.6 (H) 4.0 - 10.5 K/uL   RBC 4.32 3.87 - 5.11 MIL/uL   Hemoglobin 12.6 12.0 - 15.0 g/dL   HCT 63.0 63.9 - 53.9 %   MCV 85.4 80.0 - 100.0 fL   MCH 29.2 26.0 - 34.0 pg   MCHC 34.1 30.0 - 36.0 g/dL   RDW 87.0 88.4 - 84.4 %   Platelets 217 150 - 400 K/uL   nRBC 0.0 0.0 - 0.2 %   Neutrophils Relative % 91 %   Neutro Abs 11.6 (H) 1.7 - 7.7 K/uL   Lymphocytes Relative 3 %   Lymphs Abs 0.4 (L) 0.7 - 4.0 K/uL   Monocytes Relative 5 %   Monocytes Absolute 0.6 0.1 - 1.0 K/uL   Eosinophils Relative 0 %   Eosinophils Absolute 0.0 0.0 - 0.5 K/uL   Basophils Relative 0 %   Basophils Absolute 0.0 0.0 - 0.1 K/uL   Immature Granulocytes 1 %   Abs Immature Granulocytes 0.06 0.00 - 0.07 K/uL    Comment: Performed at [facility], [address] [place], [place] [address]  hCG, serum, qualitative     Status: None   Collection Time: 10/10/23  8:07 AM  Result Value Ref Range   Preg, Serum NEGATIVE NEGATIVE    Comment:        THE  SENSITIVITY OF THIS METHODOLOGY IS >10 mIU/mL. Performed at [facility], [address] [place], [place] [address]    CT PELVIS W CONTRAST Result Date: 10/10/2023 CLINICAL DATA:  Acute rectal pain. EXAM: CT PELVIS WITH CONTRAST TECHNIQUE: Multidetector CT imaging of the pelvis was performed using the standard protocol following the bolus administration of intravenous contrast. RADIATION DOSE REDUCTION: This exam was performed according to the departmental dose-optimization program which includes automated exposure control, adjustment of the mA  and/or kV according to patient size and/or use of iterative reconstruction technique. CONTRAST:  OMNIPAQUE  IOHEXOL  300 MG/ML  SOLN COMPARISON:  None Available. FINDINGS: Urinary Tract:  Urinary bladder is unremarkable. Bowel: There is no evidence of bowel obstruction. Bilobed perianal fluid collection is noted with gas, measuring 4.0 x 1.7 cm on the right and 3.7 x 1.9 cm on the left. These fluid collections are connected anterior to the rectum. This is consistent with abscess. Surrounding inflammatory changes are noted in the subcutaneous tissues of the inferior buttocks. Vascular/Lymphatic: No pathologically enlarged lymph nodes. No significant vascular abnormality seen. Reproductive: Intrauterine device is noted. No definite adnexal abnormality. Other: Small amount of free fluid is noted in the pelvis which most likely is physiologic. No hernia is noted. Musculoskeletal: No suspicious bone lesions identified. IMPRESSION: Bilobed perianal fluid collection is noted with gas, measuring 4.0 x 1.7 cm on the right and 3.7 x 1.9 cm on the left. These fluid collections are connected anterior to the rectum. This is consistent with abscess. Surrounding inflammatory changes are noted in the subcutaneous tissues of the inferior buttocks. Electronically Signed   By: Lynwood Landy Raddle M.D.   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  She is currently undergoing work up by the GI service to rule out crohn's disease.  Agree with abx therapy.  We will plan to proceed to the OR for EUA and drainage. Given a known fistula she may require seton placement or additional surgical intervention moving forward due to risk of abscess recurrence.   FEN - NPO/IVFs VTE - ok for chemical prophylaxis from our standpoint ID - zosyn   Anxiety/Depression Lupus Sjogren's syndrome  I reviewed nursing notes, ED provider notes, last 24 h vitals and pain scores, last 48 h intake and output, last 24 h labs and trends, and last 24 h imaging results.  Almarie Pringle, [facility] 10/10/2023, 11:39 AM Please see Amion for pager number during day hours 7:00am-4:30pm or 7:00am -11:30am on weekends

## 2023-10-10 NOTE — ED Notes (Signed)
 Provider informed of Temp... Instructed to hold Tylenol ... MD wanted to consult [email or web address]

## 2023-10-10 NOTE — Transfer of Care (Signed)
 Immediate Anesthesia Transfer of Care Note  Patient: Debbie [name]  Procedure(s) Performed: INCISION AND DRAINAGE, ABSCESS, PERIRECTAL (Rectum) EXAM UNDER ANESTHESIA, RECTUM (Rectum)  Patient Location: PACU  Anesthesia Type:General  Level of Consciousness: awake  Airway & Oxygen Therapy: Patient Spontanous Breathing  Post-op Assessment: Report given to RN and Post -op Vital signs reviewed and stable  Post vital signs: Reviewed and stable  Last Vitals:  Vitals Value Taken Time  BP 109/71 10/10/23 15:30  Temp 36.7 C 10/10/23 15:30  Pulse 73 10/10/23 15:34  Resp 8 10/10/23 15:34  SpO2 100 % 10/10/23 15:34  Vitals shown include unfiled device data.  Last Pain:  Vitals:   10/10/23 1530  TempSrc:   PainSc: 0-No pain         Complications: No notable events documented.

## 2023-10-10 NOTE — ED Notes (Signed)
 Report given to [facility].

## 2023-10-10 NOTE — Op Note (Signed)
 10/10/2023  3:21 PM  PATIENT:  Debbie [name]  33 y.o. female  Patient Care Team: Allwardt, Mardy HERO, PA-C as PCP - General (Physician Assistant) Sheffield, Andrez SAUNDERS, PA-C (Inactive) as Physician Assistant (Dermatology)  PRE-OPERATIVE DIAGNOSIS:  Perirectal abscess  POST-OPERATIVE DIAGNOSIS:  Same  PROCEDURE:   Incision and drainage of bilateral perirectal abscesses  SURGEON:  Cordella RONAL Idler, MD  ASSISTANT: None  ANESTHESIA:   general  COUNTS:  Sponge, needle and instrument counts were reported correct x2 at the conclusion of the operation.  EBL: Minimal  DRAINS: None  SPECIMEN: Fluid for culture  COMPLICATIONS: None  FINDINGS: Bilateral perirectal abscesses identified and drained. There was purulence within the anal canal but no obvious fistula could be identified.   DISPOSITION: PACU in satisfactory condition  INDICATION: Debbie [name] is a 33 year old female who developed perirectal swelling and pain while undergoing a bowel prep before colonoscopy to evaluate for IBD. She presented to the ED where CT showed bilateral perirectal abscesses. I recommended that we proceed to the OR for examination and drainage of the infection. All of her questions were addressed and written consent was obtained.  DESCRIPTION: The patient was identified in preop holding and taken to the OR where she was placed on the operating room table. SCDs were placed. General endotracheal anesthesia was induced without difficulty. She was placed in lithotomy and then the perineum and rectum was then prepped and draped in the usual sterile fashion. A surgical timeout was performed indicating the correct patient, procedure, positioning and need for preoperative antibiotics.   I began with a digital rectal exam. There was a moderate amount of purulent drainage appreciated. I placed a speculum within the anus but was unable to identify an obvious fistula or site of drainage. There was fluctuance in the  bilateral perirectal area. I made an 2-cm elliptical incision within the left gluteus, adjacent to the anus. There was an immediate return of purulence. I probed the cavity and found that it tracked anteromedial but could not identify an obvious connection. All loculations were broken up and the cavity was then irrigated with sterile saline. The same steps were performed on the right side and again a pocket of purulence was identified that racked anteromedial. Cultures were sent. The wounds were inspected for hemostasis and then packed with iodoform gauze. A dressing was applied.

## 2023-10-11 ENCOUNTER — Other Ambulatory Visit (HOSPITAL_COMMUNITY): Payer: Self-pay

## 2023-10-11 ENCOUNTER — Encounter (HOSPITAL_COMMUNITY): Payer: Self-pay | Admitting: General Surgery

## 2023-10-11 DIAGNOSIS — K611 Rectal abscess: Secondary | ICD-10-CM | POA: Diagnosis not present

## 2023-10-11 DIAGNOSIS — K603 Anal fistula, unspecified: Secondary | ICD-10-CM

## 2023-10-11 LAB — BASIC METABOLIC PANEL WITH GFR
Anion gap: 8 (ref 5–15)
BUN: 8 mg/dL (ref 6–20)
CO2: 24 mmol/L (ref 22–32)
Calcium: 8.9 mg/dL (ref 8.9–10.3)
Chloride: 104 mmol/L (ref 98–111)
Creatinine, Ser: 0.84 mg/dL (ref 0.44–1.00)
GFR, Estimated: >60 mL/min (ref >60)
Glucose, Bld: 118 mg/dL — ABNORMAL HIGH (ref 70–99)
Potassium: 4.4 mmol/L (ref 3.5–5.1)
Sodium: 136 mmol/L (ref 135–145)

## 2023-10-11 LAB — CBC
HCT: 35.1 % — ABNORMAL LOW (ref 36.0–46.0)
Hemoglobin: 11.7 g/dL — ABNORMAL LOW (ref 12.0–15.0)
MCH: 29.3 pg (ref 26.0–34.0)
MCHC: 33.3 g/dL (ref 30.0–36.0)
MCV: 88 fL (ref 80.0–100.0)
Platelets: 221 K/uL (ref 150–400)
RBC: 3.99 MIL/uL (ref 3.87–5.11)
RDW: 13.2 % (ref 11.5–15.5)
WBC: 13.3 K/uL — ABNORMAL HIGH (ref 4.0–10.5)
nRBC: 0 % (ref 0.0–0.2)

## 2023-10-11 MED ORDER — KETOROLAC TROMETHAMINE 10 MG PO TABS
10.0000 mg | ORAL_TABLET | Freq: Four times a day (QID) | ORAL | 0 refills | Status: DC | PRN
  Filled 2023-10-11: qty 20, 5d supply, fill #0

## 2023-10-11 MED ORDER — METRONIDAZOLE 500 MG PO TABS: 500.0000 mg | ORAL_TABLET | Freq: Two times a day (BID) | ORAL | Status: DC

## 2023-10-11 MED ORDER — CIPROFLOXACIN HCL 500 MG PO TABS: 500.0000 mg | ORAL_TABLET | Freq: Two times a day (BID) | ORAL | Status: DC

## 2023-10-11 MED ORDER — HYDROCODONE-ACETAMINOPHEN 5-325 MG PO TABS
1.0000 | ORAL_TABLET | Freq: Four times a day (QID) | ORAL | 0 refills | Status: AC | PRN
  Filled 2023-10-11: qty 20, 5d supply, fill #0

## 2023-10-11 MED ORDER — AMOXICILLIN-POT CLAVULANATE 875-125 MG PO TABS
1.0000 | ORAL_TABLET | Freq: Two times a day (BID) | ORAL | Status: DC
  Administered 2023-10-11: 1 via ORAL
  Filled 2023-10-11: qty 1

## 2023-10-11 NOTE — TOC Transition Note (Signed)
 Transition of Care [facility]) - Discharge Note   Patient Details  Name: Debbie [name] MRN: [id number] Date of Birth: 01/09/1991  [facility]) CM/SW Contact:  Tom-Johnson, Harvest Muskrat, RN Phone Number: 10/11/2023, 11:37 AM   Clinical Narrative:     Patient is scheduled for discharge today.  Readmission Risk Assessment done. Outpatient f/u, hospital f/u and discharge instructions on AVS. [facility] and patient will pickup at discharge. No TOC needs or recommendations noted. Mother, Ellouise to transport at discharge.  No further TOC needs noted.        Final next level of care: Home/Self Care Barriers to Discharge: Barriers Resolved   Patient Goals and CMS Choice Patient states their goals for this hospitalization and ongoing recovery are:: To return home CMS Medicare.gov Compare Post Acute Care list provided to:: Patient Choice offered to / list presented to : NA      Discharge Placement                Patient to be transferred to facility by: Mother Name of family member notified: Tina    Discharge Plan and Services Additional resources added to the After Visit Summary for                  DME Arranged: N/A DME Agency: NA       HH Arranged: NA HH Agency: NA        Social Drivers of Health (SDOH) Interventions SDOH Screenings   Food Insecurity: No Food Insecurity (10/10/2023)  Housing: Low Risk  (10/10/2023)  Transportation Needs: No Transportation Needs (10/10/2023)  Utilities: Not At Risk (10/10/2023)  Depression (PHQ2-9): Medium Risk (12/08/2022)  Tobacco Use: High Risk (10/10/2023)     Readmission Risk Interventions    10/11/2023   11:36 AM  Readmission Risk Prevention Plan  Post Dischage Appt Complete  Medication Screening Complete  Transportation Screening Complete

## 2023-10-11 NOTE — Consult Note (Addendum)
 Consultation Note   Referring Provider:  [facility] PCP: Allwardt, Mardy HERO, PA-C Primary Gastroenterologist:  Inocente Hausen, MD       Reason for Consultation:   Perirectal abscess DOA: 10/10/2023         Hospital Day: 2   ASSESSMENT    Patient Profile:  33 y.o. year old female with a medical history including but not limited to SLE vs. Sjogren's syndrome,  anxiety and depression. Recently undergoing outpatient evaluation of abdominal pain, perianal pain / abnormal perianal findings on CT scan. Was scheduled for outpatient EGD / colonoscopy but admitted for worsening rectal pain / perineal swelling while prepping for colonoscopy.  Subsequently admitted with perianal abscesses.   Bilateral perirectal abscesses Concern for perianal Crohn's disease, workup still in progress S/p I&D  8/19.  TODAY:  Was feeling better but packing just removed and having some mild perineal discomfort right now. Afebrile. WBC 13.3K  SLE vr Sjogren's syndrome Hasn't required treatment at this point   Principal Problem:   Rectal abscess Active Problems:   Perirectal abscess    PLAN:   -Follow up cultures -Continue IV Antibiotics - on Zosyn  -Prn analgesics -Monitor / avoid constipation with analgesics -Holding off on endoscopic evaluation for now -I contacted MRI.  They will addend the recent MRI enterography report to reflect additional studies performed ( see below)  HPI   Debbie [name] has been having several weeks of perianal pain  and swelling despite a couple of antibiotic. A CT scan showed bilateral perianal phlegmonous change. She was referred by PCP to our office and seen on 7/29. We were concerned about developing perianal Crohn's disease. How fecal calprotectin was normal as was her CRP. MR enterography on 8/8 didn't show evidence of inflammatory bowel disease within the abdomen or pelvis but  protocol performed didn't adequately assess for  perianal fistula or abscess as was suspected by prior CT examination.  Therefore she was brought back for additional study at later date. However, additional report / addendum not in Epic.    She was scheduled for EGD / colonoscopy but went to ED a couple of days a couple for progressive perianal pain.    WBC 13.3, hgb 12.5 Pelvic CT scan showed abscess  Pelvic CT with contrast Bilobed perianal fluid collection is noted with gas, measuring 4.0 x 1.7 cm on the right and 3.7 x 1.9 cm on the left. These fluid collections are connected anterior to the rectum. This is consistent with abscess. Surrounding inflammatory changes are noted in the subcutaneous tissues of the inferior buttocks.  Labs and Imaging: Recent Labs    10/10/23 0632 10/11/23 0407  WBC 12.6* 13.3*  HGB 12.6 11.7*  HCT 36.9 35.1*  MCV 85.4 88.0  PLT 217 221   Recent Labs    10/10/23 0632 10/11/23 0407  NA 137 136  K 3.5 4.4  CL 102 104  CO2 21* 24  GLUCOSE 121* 118*  BUN 8 8  CREATININE 1.13* 0.84  CALCIUM 9.3 8.9     CT PELVIS W CONTRAST CLINICAL DATA:  Acute rectal pain.  EXAM: CT PELVIS WITH CONTRAST  TECHNIQUE: Multidetector CT imaging of the pelvis was performed using the standard protocol following the bolus administration of  intravenous contrast.  RADIATION DOSE REDUCTION: This exam was performed according to the departmental dose-optimization program which includes automated exposure control, adjustment of the mA and/or kV according to patient size and/or use of iterative reconstruction technique.  CONTRAST:  OMNIPAQUE  IOHEXOL  300 MG/ML  SOLN  COMPARISON:  None Available.  FINDINGS: Urinary Tract:  Urinary bladder is unremarkable.  Bowel: There is no evidence of bowel obstruction. Bilobed perianal fluid collection is noted with gas, measuring 4.0 x 1.7 cm on the right and 3.7 x 1.9 cm on the left. These fluid collections are connected anterior to the rectum. This is  consistent with abscess. Surrounding inflammatory changes are noted in the subcutaneous tissues of the inferior buttocks.  Vascular/Lymphatic: No pathologically enlarged lymph nodes. No significant vascular abnormality seen.  Reproductive: Intrauterine device is noted. No definite adnexal abnormality.  Other: Small amount of free fluid is noted in the pelvis which most likely is physiologic. No hernia is noted.  Musculoskeletal: No suspicious bone lesions identified.  IMPRESSION: Bilobed perianal fluid collection is noted with gas, measuring 4.0 x 1.7 cm on the right and 3.7 x 1.9 cm on the left. These fluid collections are connected anterior to the rectum. This is consistent with abscess. Surrounding inflammatory changes are noted in the subcutaneous tissues of the inferior buttocks.  Electronically Signed   By: Lynwood Landy Raddle M.D.   On: 10/10/2023 10:02   Past Medical History:  Diagnosis Date   Anxiety    Depression    Lupus    Sjogren's syndrome [facility])     Past Surgical History:  Procedure Laterality Date   CYSTOSCOPY     INCISION AND DRAINAGE PERIRECTAL ABSCESS N/A 10/10/2023   Procedure: INCISION AND DRAINAGE, ABSCESS, PERIRECTAL;  Surgeon: Polly Cordella LABOR, MD;  Location: [facility] OR;  Service: General;  Laterality: N/A;  bilateral   LAPAROSCOPIC BILATERAL SALPINGECTOMY N/A 02/13/2023   Procedure: LAPAROSCOPIC BILATERAL SALPINGECTOMY;  Surgeon: Erik Kieth BROCKS, MD;  Location: [facility];  Service: Gynecology;  Laterality: N/A;   RECTAL EXAM UNDER ANESTHESIA N/A 10/10/2023   Procedure: EXAM UNDER ANESTHESIA, RECTUM;  Surgeon: Polly Cordella LABOR, MD;  Location: [facility] OR;  Service: General;  Laterality: N/A;    Family History  Problem Relation Age of Onset   Thyroid disease Mother    Cancer Maternal Grandmother    Cancer Paternal Grandfather    Hashimoto's thyroiditis Sister    Thyroid disease Sister    Thyroid disease Brother     Prior to  Admission medications   Medication Sig Start Date End Date Taking? Authorizing Provider  acetaminophen  (TYLENOL ) 500 MG tablet Take 1,000 mg by mouth as needed.   Yes [provider]  ciprofloxacin  (CIPRO ) 500 MG tablet Take 1 tablet (500 mg total) by mouth 2 (two) times daily. 10/06/23 11/08/23 Yes McGreal, Inocente HERO, MD  levonorgestrel (LILETTA, 52 MG,) 20.1 MCG/DAY IUD IUD 1 each by Intrauterine route once.   Yes [provider]  metroNIDAZOLE  (FLAGYL ) 500 MG tablet Take 1 tablet (500 mg total) by mouth 2 (two) times daily. 10/09/23  Yes McGreal, Inocente HERO, MD  Misc Natural Products (TURMERIC, CURCUMIN, PO) Take 1 tablet by mouth daily.   Yes [provider]  Omega-3 Fatty Acids (FISH OIL PO) Take 2 tablets by mouth daily.   Yes [provider]  ondansetron  (ZOFRAN -ODT) 8 MG disintegrating tablet Take 1 tablet (8 mg total) by mouth every 8 (eight) hours as needed for nausea or vomiting. 10/06/23  Yes McGreal, Inocente HERO, MD  pantoprazole  (PROTONIX ) 40 MG tablet Take 1 tablet (40 mg total) by mouth daily. Take 20-30 minutes before meal. 09/19/23  Yes McGreal, Inocente HERO, MD  Sofpironium  Bromide (SOFDRA ) 12.45 % GEL Apply 1 Application topically at bedtime. Apply to both feet and between toes. 08/29/23  Yes Alm Delon SAILOR, DO  traZODone  (DESYREL ) 50 MG tablet Take 50 mg by mouth at bedtime.   Yes [provider]  cephALEXin  (KEFLEX ) 500 MG capsule Take 1 capsule (500 mg total) by mouth 4 (four) times daily. Patient not taking: No sig reported 09/14/23   Horton, Kristie M, DO  ketorolac  (TORADOL ) 10 MG tablet Take 1 tablet (10 mg total) by mouth every 6 (six) hours as needed (pain). Patient not taking: Reported on 10/10/2023 09/14/23   Horton, Roxie HERO, DO    Current Facility-Administered Medications  Medication Dose Route Frequency Provider Last Rate Last Admin   acetaminophen  (TYLENOL ) tablet 650 mg  650 mg Oral Q6H PRN Rashid, Farhan, MD       Or   acetaminophen   (TYLENOL ) suppository 650 mg  650 mg Rectal Q6H PRN Rashid, Farhan, MD       bisacodyl  (DULCOLAX) EC tablet 5 mg  5 mg Oral Daily PRN Rashid, Farhan, MD       hydrALAZINE  (APRESOLINE ) injection 10 mg  10 mg Intravenous Q6H PRN Rashid, Farhan, MD       HYDROcodone -acetaminophen  (NORCO/VICODIN) 5-325 MG per tablet 1-2 tablet  1-2 tablet Oral Q4H PRN Rashid, Farhan, MD   1 tablet at 10/11/23 9089   methocarbamol  (ROBAXIN ) injection 500 mg  500 mg Intravenous Q6H PRN Rashid, Farhan, MD       ondansetron  (ZOFRAN ) tablet 4 mg  4 mg Oral Q6H PRN Rashid, Farhan, MD       Or   ondansetron  (ZOFRAN ) injection 4 mg  4 mg Intravenous Q6H PRN Rashid, Farhan, MD       piperacillin -tazobactam (ZOSYN ) IVPB 3.375 g  3.375 g Intravenous Q8H Amin, Ankit C, MD 12.5 mL/hr at 10/11/23 0542 3.375 g at 10/11/23 0542   senna-docusate (Senokot-S) tablet 1 tablet  1 tablet Oral QHS PRN Rashid, Farhan, MD       traZODone  (DESYREL ) tablet 50 mg  50 mg Oral QHS PRN Rashid, Farhan, MD        Allergies as of 10/10/2023 - Review Complete 10/10/2023  Allergen Reaction Noted   Latex Rash 02/23/2021   Sulfamethoxazole-trimethoprim Rash and Swelling 11/15/2017    Social History   Socioeconomic History   Marital status: Divorced    Spouse name: Not on file   Number of children: 0   Years of education: Not on file   Highest education level: Not on file  Occupational History   Occupation: Child psychotherapist  Tobacco Use   Smoking status: Some Days    Types: Cigarettes   Smokeless tobacco: Never   Tobacco comments:    Very rarely, social smoker  Vaping Use   Vaping status: Some Days  Substance and Sexual Activity   Alcohol use: Yes    Comment: Rarely   Drug use: Not Currently    Types: Marijuana   Sexual activity: Yes    Birth control/protection: I.U.D., Surgical  Other Topics Concern   Not on file  Social History Narrative   Not on file   Social Drivers of Health   Financial Resource Strain: Not on file  Food  Insecurity: No Food Insecurity (10/10/2023)   Hunger Vital Sign  Worried About Programme researcher, broadcasting/film/video in the Last Year: Never true    Ran Out of Food in the Last Year: Never true  Transportation Needs: No Transportation Needs (10/10/2023)   PRAPARE - Administrator, Civil Service (Medical): No    Lack of Transportation (Non-Medical): No  Physical Activity: Not on file  Stress: Not on file  Social Connections: Not on file  Intimate Partner Violence: Not At Risk (10/10/2023)   Humiliation, Afraid, Rape, and Kick questionnaire    Fear of Current or Ex-Partner: No    Emotionally Abused: No    Physically Abused: No    Sexually Abused: No     Code Status   Code Status: Full Code  Review of Systems: All systems reviewed and negative except where noted in HPI.  Physical Exam: Vital signs in last 24 hours: Temp:  [98 F (36.7 C)-99.9 F (37.7 C)] 98.4 F (36.9 C) (08/20 0917) Pulse Rate:  [57-97] 57 (08/20 0917) Resp:  [15-20] 18 (08/20 0917) BP: (92-125)/(58-88) 103/66 (08/20 0917) SpO2:  [96 %-100 %] 99 % (08/20 0917) Weight:  [61.2 kg] 61.2 kg (08/19 1244) Last BM Date :  (PTA)  General:  Pleasant female in NAD Psych:  Cooperative. Normal mood and affect Eyes: Pupils equal Ears:  Normal auditory acuity Nose: No deformity, discharge or lesions Neck:  Supple, no masses felt Lungs:  Clear to auscultation.  Heart:  Regular rate, regular rhythm.  Abdomen:  Soft, nondistended, nontender, active bowel sounds, no masses felt Rectal :  Deferred Msk: Symmetrical without gross deformities.  Neurologic:  Alert, oriented, grossly normal neurologically Extremities : No edema Skin:  Intact without significant lesions.    Intake/Output from previous day: 08/19 0701 - 08/20 0700 In: 853.6 [I.V.:800; IV Piggyback:53.6] Out: 5 [Blood:5] Intake/Output this shift:  No intake/output data recorded.   Vina Dasen, NP-C   10/11/2023, 9:33 AM  I personally saw the patient  and performed a substantive portion of this encounter (>50% time spent), including a complete performance of at least one of the key components (MDM, Hx and/or Exam), in conjunction with the APP.  I agree with the APP's note, impression, and recommendations with additional input as follows.   33 year old female with history of SLE/Sjogren syndrome, anxiety, and depression presented with perianal pain and swelling.  She had been treated as an outpatient with oral antibiotics and was being worked up for IBD but after trying to undergo her bowel prep in preparation for an EGD/colonoscopy, she had worsening perianal pain.  Patient was treated with IV antibiotics here.  CT pelvis here showed a bilateral perianal abscesses.  Surgery took her for I&D of the perianal abscesses.  Tissue culture showed few gram-negative rods and rare Gram variable rods.  Patient was doing well after the surgery and was very eager to go home to recover.  She will continue oral antibiotics upon discharge.  We will arrange for outpatient GI follow-up to get her EGD/colonoscopy rescheduled.  Estefana Kidney, MD

## 2023-10-11 NOTE — Progress Notes (Signed)
 1 Day Post-Op  Subjective: WBC 13 from 12. She is AF and HDS.   She reports that the pain has improved. Tolerating PO. Denies new complaints.   ROS: See above, otherwise other systems negative  Objective: Vital signs in last 24 hours: Temp:  [98 F (36.7 C)-99.9 F (37.7 C)] 98.1 F (36.7 C) (08/20 0445) Pulse Rate:  [65-97] 77 (08/20 0445) Resp:  [15-20] 18 (08/20 0445) BP: (92-125)/(58-88) 125/88 (08/20 0445) SpO2:  [96 %-100 %] 100 % (08/20 0445) Weight:  [61.2 kg] 61.2 kg (08/19 1244) Last BM Date :  (PTA)  Intake/Output from previous day: 08/19 0701 - 08/20 0700 In: 853.6 [I.V.:800; IV Piggyback:53.6] Out: 5 [Blood:5] Intake/Output this shift: No intake/output data recorded.  PE: Gen: female, NAD Anorectal: packing removed, no purulence on the dressing, mild induration and minimal erythema, appropriately tender  Lab Results:  Recent Labs    10/10/23 0632 10/11/23 0407  WBC 12.6* 13.3*  HGB 12.6 11.7*  HCT 36.9 35.1*  PLT 217 221   BMET Recent Labs    10/10/23 0632 10/11/23 0407  NA 137 136  K 3.5 4.4  CL 102 104  CO2 21* 24  GLUCOSE 121* 118*  BUN 8 8  CREATININE 1.13* 0.84  CALCIUM 9.3 8.9   PT/INR No results for input(s): LABPROT, INR in the last 72 hours. CMP     Component Value Date/Time   NA 136 10/11/2023 0407   K 4.4 10/11/2023 0407   CL 104 10/11/2023 0407   CO2 24 10/11/2023 0407   GLUCOSE 118 (H) 10/11/2023 0407   BUN 8 10/11/2023 0407   CREATININE 0.84 10/11/2023 0407   CREATININE 0.87 08/25/2020 1546   CALCIUM 8.9 10/11/2023 0407   PROT 7.4 08/08/2023 0930   ALBUMIN 4.4 08/08/2023 0930   AST 17 08/08/2023 0930   ALT 10 08/08/2023 0930   ALKPHOS 44 08/08/2023 0930   BILITOT 0.6 08/08/2023 0930   GFRNONAA >60 10/11/2023 0407   GFRNONAA 91 08/25/2020 1546   GFRAA 106 08/25/2020 1546   Lipase  No results found for: LIPASE  Studies/Results: CT PELVIS W CONTRAST Result Date: 10/10/2023 CLINICAL DATA:  Acute  rectal pain. EXAM: CT PELVIS WITH CONTRAST TECHNIQUE: Multidetector CT imaging of the pelvis was performed using the standard protocol following the bolus administration of intravenous contrast. RADIATION DOSE REDUCTION: This exam was performed according to the departmental dose-optimization program which includes automated exposure control, adjustment of the mA and/or kV according to patient size and/or use of iterative reconstruction technique. CONTRAST:  OMNIPAQUE  IOHEXOL  300 MG/ML  SOLN COMPARISON:  None Available. FINDINGS: Urinary Tract:  Urinary bladder is unremarkable. Bowel: There is no evidence of bowel obstruction. Bilobed perianal fluid collection is noted with gas, measuring 4.0 x 1.7 cm on the right and 3.7 x 1.9 cm on the left. These fluid collections are connected anterior to the rectum. This is consistent with abscess. Surrounding inflammatory changes are noted in the subcutaneous tissues of the inferior buttocks. Vascular/Lymphatic: No pathologically enlarged lymph nodes. No significant vascular abnormality seen. Reproductive: Intrauterine device is noted. No definite adnexal abnormality. Other: Small amount of free fluid is noted in the pelvis which most likely is physiologic. No hernia is noted. Musculoskeletal: No suspicious bone lesions identified. IMPRESSION: Bilobed perianal fluid collection is noted with gas, measuring 4.0 x 1.7 cm on the right and 3.7 x 1.9 cm on the left. These fluid collections are connected anterior to the rectum. This is consistent with  abscess. Surrounding inflammatory changes are noted in the subcutaneous tissues of the inferior buttocks. Electronically Signed   By: Lynwood Landy Raddle M.D.   On: 10/10/2023 10:02    Anti-infectives: Anti-infectives (From admission, onward)    Start     Dose/Rate Route Frequency Ordered Stop   10/10/23 1600  piperacillin -tazobactam (ZOSYN ) IVPB 3.375 g        3.375 g 12.5 mL/hr over 240 Minutes Intravenous Every 8 hours  10/10/23 1155     10/10/23 1200  piperacillin -tazobactam (ZOSYN ) IVPB 3.375 g        3.375 g 100 mL/hr over 30 Minutes Intravenous  Once 10/10/23 1155 10/10/23 1403       Assessment/Plan  33 y/o F who was being worked up for possible IBD and undergoing bowel prep when she developed perirectal abscesses  - Now s/p I&D of bilateral perirectal abscesses. Packing removed today - She does not have signs of worsening infection and is interested in discharge. It would be reasonable to transition her to oral antibiotics and plan for discharge today pending GI consult.  - Wound care: Cover wounds, change daily or when soiled   LOS: 1 day   Cordella DELENA Polly Marlis [facility] 10/11/2023, 9:15 AM Please see Amion for pager number during day hours 7:00am-4:30pm or 7:00am -11:30am on weekends

## 2023-10-11 NOTE — Discharge Summary (Signed)
 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  Outpatient workup in progress regarding Crohn's disease with MR enterogram and was scheduled for EGD and colonoscopy.  Past few days, pain and swelling were worse so she came to ER.  A CT scan showed perirectal abscess bilateral, she was admitted to the hospital underwent I&D of bilateral perirectal abscesses.  Bilateral perirectal abscess, suspected inflammatory bowel disease: Status post I&D.  Surgically stable as per surgery.  Remains fairly stable. Continue ciprofloxacin  and Flagyl  on discharge. Tissue culture with few gram-negative rods and rare Gram variable rods.  Anticipate to continue long-term Cipro  Flagyl  for now. Schedule outpatient follow-up with GI, will need colonoscopy once active inflammation improves. Adequate pain medications.  Local dressing instructions.  Discharge Diagnoses:  Principal Problem:   Rectal abscess Active Problems:   Perirectal abscess    Discharge Instructions  Discharge Instructions     Call MD for:  severe uncontrolled pain   Complete by: As directed    Call MD for:  temperature >100.4   Complete by: As directed    Diet general   Complete by: As directed    Discharge wound care:   Complete by: As directed    Wash with soap  and water and apply dry dressing , keep covered   Increase activity slowly   Complete by: As directed       Allergies as of 10/11/2023       Reactions   Latex Rash   Sulfamethoxazole-trimethoprim Rash, Swelling        Medication List     STOP taking these medications    cephALEXin  500 MG capsule Commonly known as: KEFLEX        TAKE these medications    acetaminophen  500 MG tablet Commonly known as: TYLENOL  Take 1,000 mg by mouth as needed.   ciprofloxacin  500 MG tablet Commonly known as: CIPRO  Take 1 tablet (500 mg total) by mouth 2 (two) times daily.   FISH OIL PO Take 2 tablets by mouth daily.   HYDROcodone -acetaminophen  5-325 MG tablet Commonly known as: NORCO/VICODIN Take 1 tablet by mouth every 6 (six) hours as needed for up to 5 days for severe pain (pain score 7-10).   ketorolac  10 MG tablet Commonly known as: TORADOL  Take 1 tablet (10 mg total) by mouth every 6 (six) hours as needed for moderate pain (pain score 4-6) (pain). What changed: reasons to take this   Liletta (52 MG) 20.1 MCG/DAY Iud IUD Generic drug: levonorgestrel 1 each by Intrauterine route once.   metroNIDAZOLE  500 MG tablet Commonly known as: FLAGYL  Take 1 tablet (500 mg total) by mouth 2 (two) times daily.   ondansetron  8 MG disintegrating tablet Commonly known as: ZOFRAN -ODT Take 1 tablet (8 mg total) by mouth every 8 (eight) hours as needed for nausea or vomiting.   pantoprazole  40 MG tablet Commonly known as: PROTONIX  Take 1 tablet (40 mg total)  by mouth daily. Take 20-30 minutes before meal.   Sofdra  12.45 % Gel Generic drug: Sofpironium  Bromide Apply 1 Application topically at bedtime. Apply to both feet and between toes.   traZODone  50 MG tablet Commonly known as: DESYREL  Take 50 mg by mouth at bedtime.   TURMERIC (CURCUMIN) PO Take 1 tablet by mouth daily.               Discharge Care Instructions  (From admission, onward)           Start      Ordered   10/11/23 0000  Discharge wound care:       Comments: Wash with soap and water and apply dry dressing , keep covered   10/11/23 1123            Follow-up Information     Maczis, Puja Gosai, PA-C. Go on 11/02/2023.   Specialty: General Surgery Why: at 2:45 PM for wound re-check, For post-operative follow up. please arrive 20-30 minutes early. Contact information: [address] [place] [address] [phone]                Allergies  Allergen Reactions   Latex Rash   Sulfamethoxazole-Trimethoprim Rash and Swelling    Consultations: General Surgery Gastroenterology   Procedures/Studies: CT PELVIS W CONTRAST Result Date: 10/10/2023 CLINICAL DATA:  Acute rectal pain. EXAM: CT PELVIS WITH CONTRAST TECHNIQUE: Multidetector CT imaging of the pelvis was performed using the standard protocol following the bolus administration of intravenous contrast. RADIATION DOSE REDUCTION: This exam was performed according to the departmental dose-optimization program which includes automated exposure control, adjustment of the mA and/or kV according to patient size and/or use of iterative reconstruction technique. CONTRAST:  OMNIPAQUE  IOHEXOL  300 MG/ML  SOLN COMPARISON:  None Available. FINDINGS: Urinary Tract:  Urinary bladder is unremarkable. Bowel: There is no evidence of bowel obstruction. Bilobed perianal fluid collection is noted with gas, measuring 4.0 x 1.7 cm on the right and 3.7 x 1.9 cm on the left. These fluid collections are connected anterior to the rectum. This is consistent with abscess. Surrounding inflammatory changes are noted in the subcutaneous tissues of the inferior buttocks. Vascular/Lymphatic: No pathologically enlarged lymph nodes. No significant vascular abnormality seen. Reproductive: Intrauterine device is noted. No definite adnexal abnormality. Other: Small amount of free fluid is noted in the pelvis which most likely is physiologic. No  hernia is noted. Musculoskeletal: No suspicious bone lesions identified. IMPRESSION: Bilobed perianal fluid collection is noted with gas, measuring 4.0 x 1.7 cm on the right and 3.7 x 1.9 cm on the left. These fluid collections are connected anterior to the rectum. This is consistent with abscess. Surrounding inflammatory changes are noted in the subcutaneous tissues of the inferior buttocks. Electronically Signed   By: Lynwood Landy Raddle M.D.   On: 10/10/2023 10:02   MR ENTERO ABDOMEN W WO CONTRAST Result Date: 10/05/2023 CLINICAL DATA:  Evaluate for Crohn's disease including perianal disease EXAM: MR ABDOMEN AND PELVIS WITHOUT AND WITH CONTRAST (MR ENTEROGRAPHY) TECHNIQUE: Multiplanar, multisequence MRI of the abdomen and pelvis was performed both before and during bolus administration of intravenous contrast. Negative oral contrast VoLumen was given. CONTRAST:  6.5mL GADAVIST  GADOBUTROL  1 MMOL/ML IV SOLN COMPARISON:  CT abdomen pelvis, 09/13/2023 FINDINGS: COMBINED FINDINGS FOR BOTH MR ABDOMEN AND PELVIS Lower chest: No acute abnormality. Hepatobiliary: No solid liver abnormality is seen. No gallstones, gallbladder wall thickening, or biliary dilatation. Pancreas: Unremarkable. No pancreatic ductal dilatation or surrounding  inflammatory changes. Spleen: Normal in size without significant abnormality. Adrenals/Urinary Tract: Adrenal glands are unremarkable. Kidneys are normal, without obvious renal calculi, solid lesion, or hydronephrosis. Bladder is unremarkable. Stomach/Bowel: Stomach is within normal limits. Appendix appears normal. No evidence of bowel wall thickening, distention, or inflammatory changes. Vascular/Lymphatic: No significant vascular findings are present. No enlarged abdominal or pelvic lymph nodes. Reproductive: No mass or other significant abnormality. IUD present in the fundal endometrial cavity. Other: No abdominal wall hernia or abnormality. No ascites. Musculoskeletal: No acute or  significant osseous findings. IMPRESSION: 1. No MR evidence of inflammatory bowel disease within the abdomen or pelvis. 2. Unfortunately, MR enterography protocol performed today does not adequately assess for perianal fistula or abscess as suspected by prior CT examination dated 09/13/2023. Technical repeat examination can be performed at no charge to the patient to reassess. 3. IUD present in the fundal endometrial cavity. Electronically Signed   By: Marolyn JONETTA Jaksch M.D.   On: 10/05/2023 15:28   MR ENTERO PELVIS W WO CONTRAST Result Date: 10/05/2023 CLINICAL DATA:  Evaluate for Crohn's disease including perianal disease EXAM: MR ABDOMEN AND PELVIS WITHOUT AND WITH CONTRAST (MR ENTEROGRAPHY) TECHNIQUE: Multiplanar, multisequence MRI of the abdomen and pelvis was performed both before and during bolus administration of intravenous contrast. Negative oral contrast VoLumen was given. CONTRAST:  6.5mL GADAVIST  GADOBUTROL  1 MMOL/ML IV SOLN COMPARISON:  CT abdomen pelvis, 09/13/2023 FINDINGS: COMBINED FINDINGS FOR BOTH MR ABDOMEN AND PELVIS Lower chest: No acute abnormality. Hepatobiliary: No solid liver abnormality is seen. No gallstones, gallbladder wall thickening, or biliary dilatation. Pancreas: Unremarkable. No pancreatic ductal dilatation or surrounding inflammatory changes. Spleen: Normal in size without significant abnormality. Adrenals/Urinary Tract: Adrenal glands are unremarkable. Kidneys are normal, without obvious renal calculi, solid lesion, or hydronephrosis. Bladder is unremarkable. Stomach/Bowel: Stomach is within normal limits. Appendix appears normal. No evidence of bowel wall thickening, distention, or inflammatory changes. Vascular/Lymphatic: No significant vascular findings are present. No enlarged abdominal or pelvic lymph nodes. Reproductive: No mass or other significant abnormality. IUD present in the fundal endometrial cavity. Other: No abdominal wall hernia or abnormality. No ascites.  Musculoskeletal: No acute or significant osseous findings. IMPRESSION: 1. No MR evidence of inflammatory bowel disease within the abdomen or pelvis. 2. Unfortunately, MR enterography protocol performed today does not adequately assess for perianal fistula or abscess as suspected by prior CT examination dated 09/13/2023. Technical repeat examination can be performed at no charge to the patient to reassess. 3. IUD present in the fundal endometrial cavity. Electronically Signed   By: Marolyn JONETTA Jaksch M.D.   On: 10/05/2023 15:28   CT ABDOMEN PELVIS W CONTRAST Result Date: 09/13/2023 CLINICAL DATA:  Rectal pain x6 weeks. EXAM: CT ABDOMEN AND PELVIS WITH CONTRAST TECHNIQUE: Multidetector CT imaging of the abdomen and pelvis was performed using the standard protocol following bolus administration of intravenous contrast. RADIATION DOSE REDUCTION: This exam was performed according to the departmental dose-optimization program which includes automated exposure control, adjustment of the mA and/or kV according to patient size and/or use of iterative reconstruction technique. CONTRAST:  85mL OMNIPAQUE  IOHEXOL  300 MG/ML  SOLN COMPARISON:  Jul 05, 2022 FINDINGS: Lower chest: No acute abnormality. Hepatobiliary: No focal liver abnormality is seen. No gallstones, gallbladder wall thickening, or biliary dilatation. Pancreas: Unremarkable. No pancreatic ductal dilatation or surrounding inflammatory changes. Spleen: Normal in size without focal abnormality. Adrenals/Urinary Tract: Adrenal glands are unremarkable. Kidneys are normal, without renal calculi, focal lesion, or hydronephrosis. Bladder is unremarkable. Stomach/Bowel: Stomach is within normal limits.  Appendix appears normal. No evidence of bowel wall thickening, distention, or inflammatory changes. Vascular/Lymphatic: No significant vascular findings are present. No enlarged abdominal or pelvic lymph nodes. Reproductive: An IUD is in place. The uterus and bilateral adnexa  are otherwise unremarkable. Other: No abdominal wall hernia or abnormality. No abdominopelvic ascites. Musculoskeletal: Multiple small areas of toe tissue attenuation are seen within the bilateral ischial rectal fossa (the largest on the right measures approximately 15 mm, while the largest on the left measures approximately 9 mm). A mild amount of surrounding soft tissue attenuation and inflammatory fat stranding is noted. No associated fluid collections are seen. No acute osseous abnormalities are identified. IMPRESSION: Findings consistent with bilateral perianal phlegmonous changes. Electronically Signed   By: Suzen Dials M.D.   On: 09/13/2023 23:22   (Echo, Carotid, EGD, Colonoscopy, ERCP)    Subjective: Patient seen and examined.  Mother at the bedside.  Discussed with surgery at the bedside.  They have examined the wound and dressing removed.  Reported complete drainage of bilateral abscesses.  Patient feels comfortable going home and managing dressing changes at home.   Discharge Exam: Vitals:   10/11/23 0445 10/11/23 0917  BP: 125/88 103/66  Pulse: 77 (!) 57  Resp: 18 18  Temp: 98.1 F (36.7 C) 98.4 F (36.9 C)  SpO2: 100% 99%   Vitals:   10/10/23 1617 10/10/23 1930 10/11/23 0445 10/11/23 0917  BP: 104/75 114/80 125/88 103/66  Pulse:  71 77 (!) 57  Resp:  18 18 18   Temp: 98.5 F (36.9 C) 98.4 F (36.9 C) 98.1 F (36.7 C) 98.4 F (36.9 C)  TempSrc: Oral Oral Oral Oral  SpO2: 100% 97% 100% 99%  Weight:      Height:        General: Pt is alert, awake, not in acute distress Cardiovascular: RRR, S1/S2 +, no rubs, no gallops Respiratory: CTA bilaterally, no wheezing, no rhonchi Abdominal: Soft, NT, ND, bowel sounds + Extremities: no edema, no cyanosis Wound exam, deferred.  Done by surgery.    The results of significant diagnostics from this hospitalization (including imaging, microbiology, ancillary and laboratory) are listed below for reference.      Microbiology: Recent Results (from the past 240 hours)  Aerobic/Anaerobic Culture w Gram Stain (surgical/deep wound)     Status: None (Preliminary result)   Collection Time: 10/10/23  3:05 PM   Specimen: Path fluid; Body Fluid  Result Value Ref Range Status   Specimen Description ABSCESS PERIRECTAL  Final   Special Requests PT ON ZOSYN   Final   Gram Stain   Final    ABUNDANT WBC PRESENT, PREDOMINANTLY PMN FEW GRAM NEGATIVE RODS RARE GRAM VARIABLE ROD    Culture   Final    NO GROWTH < 12 HOURS Performed at [facility], [address] [place], [place] [address]    Report Status PENDING  Incomplete     Labs: BNP (last 3 results) No results for input(s): BNP in the last 8760 hours. Basic Metabolic Panel: Recent Labs  Lab 10/10/23 0632 10/11/23 0407  NA 137 136  K 3.5 4.4  CL 102 104  CO2 21* 24  GLUCOSE 121* 118*  BUN 8 8  CREATININE 1.13* 0.84  CALCIUM 9.3 8.9   Liver Function Tests: No results for input(s): AST, ALT, ALKPHOS, BILITOT, PROT, ALBUMIN in the last 168 hours. No results for input(s): LIPASE, AMYLASE in the last 168 hours. No results for input(s): AMMONIA in the last 168 hours. CBC:  Recent Labs  Lab 10/10/23 0632 10/11/23 0407  WBC 12.6* 13.3*  NEUTROABS 11.6*  --   HGB 12.6 11.7*  HCT 36.9 35.1*  MCV 85.4 88.0  PLT 217 221   Cardiac Enzymes: No results for input(s): CKTOTAL, CKMB, CKMBINDEX, TROPONINI in the last 168 hours. BNP: Invalid input(s): POCBNP CBG: No results for input(s): GLUCAP in the last 168 hours. D-Dimer No results for input(s): DDIMER in the last 72 hours. Hgb A1c No results for input(s): HGBA1C in the last 72 hours. Lipid Profile No results for input(s): CHOL, HDL, LDLCALC, TRIG, CHOLHDL, LDLDIRECT in the last 72 hours. Thyroid function studies No results for input(s): TSH, T4TOTAL, T3FREE, THYROIDAB in the last 72 hours.  Invalid input(s):  FREET3 Anemia work up No results for input(s): VITAMINB12, FOLATE, FERRITIN, TIBC, IRON, RETICCTPCT in the last 72 hours. Urinalysis    Component Value Date/Time   COLORURINE YELLOW 06/10/2020 0846   APPEARANCEUR CLOUDY (A) 06/10/2020 0846   LABSPEC 1.026 06/10/2020 0846   PHURINE 5.5 06/10/2020 0846   GLUCOSEU NEGATIVE 06/10/2020 0846   HGBUR NEGATIVE 06/10/2020 0846   BILIRUBINUR Negative 09/07/2023 1343   KETONESUR negative 09/13/2020 1704   KETONESUR NEGATIVE 06/10/2020 0846   PROTEINUR Negative 09/07/2023 1343   PROTEINUR NEGATIVE 06/10/2020 0846   UROBILINOGEN 0.2 09/07/2023 1343   NITRITE Negative 09/07/2023 1343   NITRITE POSITIVE (A) 06/10/2020 0846   LEUKOCYTESUR Negative 09/07/2023 1343   LEUKOCYTESUR 2+ (A) 06/10/2020 0846   Sepsis Labs Recent Labs  Lab 10/10/23 0632 10/11/23 0407  WBC 12.6* 13.3*   Microbiology Recent Results (from the past 240 hours)  Aerobic/Anaerobic Culture w Gram Stain (surgical/deep wound)     Status: None (Preliminary result)   Collection Time: 10/10/23  3:05 PM   Specimen: Path fluid; Body Fluid  Result Value Ref Range Status   Specimen Description ABSCESS PERIRECTAL  Final   Special Requests PT ON ZOSYN   Final   Gram Stain   Final    ABUNDANT WBC PRESENT, PREDOMINANTLY PMN FEW GRAM NEGATIVE RODS RARE GRAM VARIABLE ROD    Culture   Final    NO GROWTH < 12 HOURS Performed at [facility], [address], [place], [place] [address]    Report Status PENDING  Incomplete     Time coordinating discharge: 32 minutes  SIGNED:   Renato Applebaum, MD  [facility] 10/11/2023, 11:57 AM

## 2023-10-12 ENCOUNTER — Other Ambulatory Visit (HOSPITAL_COMMUNITY): Payer: Self-pay

## 2023-10-12 ENCOUNTER — Telehealth: Payer: Self-pay

## 2023-10-12 ENCOUNTER — Telehealth: Payer: Self-pay | Admitting: Physician Assistant

## 2023-10-12 ENCOUNTER — Other Ambulatory Visit: Payer: Self-pay | Admitting: Internal Medicine

## 2023-10-12 ENCOUNTER — Encounter: Payer: Self-pay | Admitting: Pediatrics

## 2023-10-12 ENCOUNTER — Other Ambulatory Visit: Payer: Self-pay

## 2023-10-12 DIAGNOSIS — R935 Abnormal findings on diagnostic imaging of other abdominal regions, including retroperitoneum: Secondary | ICD-10-CM

## 2023-10-12 DIAGNOSIS — K6289 Other specified diseases of anus and rectum: Secondary | ICD-10-CM

## 2023-10-12 DIAGNOSIS — K625 Hemorrhage of anus and rectum: Secondary | ICD-10-CM

## 2023-10-12 DIAGNOSIS — R1010 Upper abdominal pain, unspecified: Secondary | ICD-10-CM

## 2023-10-12 MED ORDER — SUTAB 1479-225-188 MG PO TABS
ORAL_TABLET | ORAL | 0 refills | Status: DC
  Filled 2023-10-12: qty 24, 1d supply, fill #0

## 2023-10-12 MED ORDER — AMOXICILLIN-POT CLAVULANATE 875-125 MG PO TABS
1.0000 | ORAL_TABLET | Freq: Two times a day (BID) | ORAL | 0 refills | Status: DC
  Filled 2023-10-12: qty 60, 30d supply, fill #0

## 2023-10-12 NOTE — Telephone Encounter (Signed)
 Please see pt msg, pt requesting labs after inpatient hospitalization but declining Hosp follow up appt.

## 2023-10-12 NOTE — Telephone Encounter (Signed)
-----   Message from Inocente CHRISTELLA Hausen sent at 10/11/2023  7:15 PM EDT ----- Thank you for your message, Estefana Britain B --  1.  Are there any APP appointments available next week for an APP to see Ms. Boxer for hospital discharge status post drainage of perianal abscess as I will be out of the office on vacation?  2.  Are there any openings at at [facility] the week I am on service September 2 through 5 where I could potentially add her on for an EGD/colon that week?  Thanks,  Inocente ----- Message ----- From: Federico Rosario BROCKS, MD Sent: 10/11/2023   5:24 PM EDT To: Inocente CHRISTELLA Hausen, MD  [facility] Inocente, this is regarding your patient with possible Crohn's disease who presented with perianal pain, found to have a bilobed perianal abscess.  Surgery took her for I&D yesterday and she was doing better afterwards.  She was treated with IV antibiotics during this hospitalization and was transitioned to oral antibiotics upon discharge.  I know that you originally had her scheduled for an EGD and colonoscopy in the [facility].  I wasn't sure if you wanted to just go ahead and directly have her rescheduled for this or if you wanted to see her in clinic beforehand.  Thanks, Estefana

## 2023-10-12 NOTE — Telephone Encounter (Signed)
 Offered patient an appt with Alan, [place] on Thursday, 10/19/23 at 1:30 pm with Alan.   EGD/colonoscopy tentatively scheduled at [facility] on 10/26/23 arriving at 6 am for a 7:30 am start time.   MyChart message sent to patient's with appt information. Will schedule f/u once patient confirms. Will send procedure instructions once patient confirms that appt.

## 2023-10-12 NOTE — Telephone Encounter (Signed)
 Returned pt call and advised PCP recommendations, pt advised she is following up with [facility] and will get labs completed there, PCP is aware and agreed this is reasonable. Nothing further needed at this time.

## 2023-10-12 NOTE — Telephone Encounter (Unsigned)
 Copied from CRM #[id number]. Topic: Appointments - Scheduling Inquiry for Clinic >> Oct 12, 2023  9:41 AM Debbie [name] wrote: Reason for CRM: Patient called in regarding just getting out the hospital, declined a HF but just needs follow up for labs , would like a callback once those orders are in    ----------------------------------------------------------------------- From previous Reason for Contact - Scheduling: Patient/patient representative is calling to schedule an appointment. Refer to attachments for appointment information.

## 2023-10-13 ENCOUNTER — Other Ambulatory Visit (HOSPITAL_COMMUNITY): Payer: Self-pay

## 2023-10-14 LAB — GENECONNECT MOLECULAR SCREEN: Genetic Analysis Overall Interpretation: NEGATIVE

## 2023-10-16 LAB — AEROBIC/ANAEROBIC CULTURE W GRAM STAIN (SURGICAL/DEEP WOUND)

## 2023-10-18 ENCOUNTER — Telehealth: Payer: Self-pay | Admitting: Gastroenterology

## 2023-10-18 NOTE — Telephone Encounter (Addendum)
 Procedure:Colonoscopy/Endoscopy Procedure date: 10/26/23 Procedure location: [facility] Arrival Time: 6:00 am Spoke with the patient Y/N: Yes Any prep concerns? No  Has the patient obtained the prep from the pharmacy ? Yes Do you have a care partner and transportation: Yes Any additional concerns? No

## 2023-10-18 NOTE — Progress Notes (Addendum)
 10/19/2023 Debbie [name] [id number] [birth year]  Referring provider: Allwardt, Mardy HERO, PA-C Primary GI doctor: Dr. 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 fecal calprotectin 32, CRP 1.4, sed rate 19.   Patient had negative TB Gold and hepatitis B surface antigen. Concerning picture for IBD however unremarkable fecal calprotectin, CRP and sed rate and multiple CTs and MRIs have not demonstrated any bowel inflammation. Patient initially on Zosyn  currently on Augmentin  875 BID since August 21st, has a 1 month supply  She  has less drainage, can still be green discharge, pain has improved, no fever, chills, AB pain States BM once daily, oats/gluten cause worsening bloating, she has been avoiding -Recheck CBC, c-Met, sed rate, CRP, celiac panel if the patient has been avoiding gluten so this may be false positive. -Plan for EGD colonoscopy 9/4 with Dr. Suzann at [facility] to rule out IBD, patient has prep and instructions -If IBD is confirmed we will likely suggest initiating Remicade for fistulizing perianal Crohn's disease, discussed with patient [name] but will ultimately be determined by Dr. Suzann -Will check TPMT  If this is unremarkable could potentially be related to  hidradenitis, autoimmune or crypto granular abscess. -Will likely benefit from repeat EUA with seton placement, has follow-up with [facility]  ? SLE/sjogren's Not on treatment Previous ANA significantly elevated 1 :340, anti-DNA negative and other factors negative Continue follow-up with rheumatology  I have reviewed the clinic note as outlined by Alan Coombs, PA and agree with the assessment, plan and medical  decision making.  Debbie [name] status post recent hospitalization for perianal abscess and suspected Crohn's disease.  No fistula identified on EUA due to presence of purulence.  MRE the chest complex fistulizing disease.  Symptoms are stable on Augmentin .  EGD and colonoscopy pending for next week.  Pending those results will likely initiate treatment with infliximab.  Debbie Suzann, Debbie [name]   Patient Care Team: Allwardt, Mardy HERO, PA-C as PCP - General (Physician Assistant) Porter Andrez SAUNDERS, PA-C (Inactive) as Physician Assistant (Dermatology)  HISTORY OF PRESENT ILLNESS: 33 y.o. female with a past medical history listed below presents for evaluation of SLE versus Sjogren's syndrome, anxiety, depression presents for perirectal abscess fistula.  Patient last seen in the office 09/19/2023 for perianal pain with associated rectal bleeding and abdominal pain and abnormal CT. 09/13/2023 CTAP -multiple areas of soft tissue attenuation in the bilateral ischial rectal fossa with surrounding soft tissue attenuation and inflammatory fat stranding suggestive of bilateral perianal phlegmonous change   Admitted 8/19 through 8/20 for worsening rectal pain and perirectal abscess while prepping for EGD colonoscopy.  10/10/2023 CT pelvis with 4 x 1.7 cm on the right and 3.7 x 1.9 cm on the left fluid collections anterior rectum.  Status post incision and drainage 8/19 with Dr. Ferris.   Placed on Zosyn .Patient switched from Cipro  Flagyl  to Augmentin .  MR enterography 8/8 inadequate study MR enterography repeat 8/8 large perianal fistula complex peers to rise at approximately 12:00 face in lithotomy position anterior midline intersphincteric fistula with large tracts that extend inferior inserted on skin to each side of the gluteal cleft.  Tracts measuring 4 cm in length and contain small  volumes of air and fluid.  No evidence of bowel wall thickening distention or inflammatory  changes.  Labs from the admission: negative H. Pylori fecal calprotectin 32, CRP 1.4, sed rate 19.   Patient had negative TB Gold and hepatitis B surface antigen. Culture showed abundant WBC few gram-negative and rare Gram variable rods, beta lactamase Sister with hashimoto's  Current plan to have colonoscopy endoscopy 9/4 at [facility] with Dr. Suzann. Has follow-up with [facility] 9/11 for possible EUA and management of fistula/abscess with seton placement.  Discussed the use of AI scribe software for clinical note transcription with the patient, who gave verbal consent to proceed.  History of Present Illness   Debbie [name] is a 33 year old female with perianal Crohn's disease who presents for follow-up and colonoscopy preparation.  She is undergoing evaluation for perianal Crohn's disease and has been scheduled for a colonoscopy on September 4th. Previous diagnostic studies, including CT scans, MR enterography, CRP, and fecal calprotectin, have been negative for bowel inflammation. She experiences drainage with a color change from green to yellow-green-red, but the amount of drainage has decreased. No fever or chills are present, and she has daily bowel movements, though in small amounts due to limited food intake.  She was hospitalized and initially treated with Zosyn , then switched to Cipro  and Flagyl , and is currently on Augmentin , which she started on August 21st. She tolerates Augmentin  better than Cipro  and Flagyl , as it does not cause nausea.  She has a history of possible lupus and Sjogren's syndrome, initially diagnosed due to a facial and body rash and joint pain, primarily in her hands and sometimes knees. A positive ANA titer of 1:320 was noted in 2022, but other lupus markers were not significant. She was previously on hydroxychloroquine  but discontinued it due to concerns about long-term use and has not seen a rheumatologist in recent years.  Her family history includes a  sister with Hashimoto's disease and a mother with possible IBS. She reports no family history of autoimmune diseases beyond this. She has no children and has had her fallopian tubes removed for medical reasons.  No fever, chills, or significant abdominal pain. Reports joint pain in hands and knees, facial and body rash, and daily bowel movements. No shortness of breath, chest discomfort, or leg swelling.      She  reports that she has been smoking cigarettes. She has never used smokeless tobacco. She reports current alcohol use. She reports that she does not currently use drugs after having used the following drugs: Marijuana.  RELEVANT GI HISTORY, IMAGING AND LABS: Results   LABS CRP: Negative Fecal calprotectin: Negative Bacterial culture: Beta-lactamase positive (10/16/2023) ANA: 1:320 (2022) Lupus anticoagulant: Not significant (2022) Anti-DNA antibody: Negative (2022)  RADIOLOGY MR enterography: No bowel inflammation      CBC    Component Value Date/Time   WBC 13.3 (H) 10/11/2023 0407   RBC 3.99 10/11/2023 0407   HGB 11.7 (L) 10/11/2023 0407   HCT 35.1 (L) 10/11/2023 0407   PLT 221 10/11/2023 0407   MCV 88.0 10/11/2023 0407   MCH 29.3 10/11/2023 0407   MCHC 33.3 10/11/2023 0407   RDW 13.2 10/11/2023 0407   LYMPHSABS 0.4 (L) 10/10/2023 0632   MONOABS 0.6 10/10/2023 0632   EOSABS 0.0 10/10/2023 0632   BASOSABS 0.0 10/10/2023 0632   Recent Labs    12/08/22 1344 02/13/23 0610 08/08/23 0930 09/13/23 2117 10/10/23 0632 10/11/23 0407  HGB 13.4 14.5 13.0 12.8 12.6 11.7*  CMP     Component Value Date/Time   NA 136 10/11/2023 0407   K 4.4 10/11/2023 0407   CL 104 10/11/2023 0407   CO2 24 10/11/2023 0407   GLUCOSE 118 (H) 10/11/2023 0407   BUN 8 10/11/2023 0407   CREATININE 0.84 10/11/2023 0407   CREATININE 0.87 08/25/2020 1546   CALCIUM 8.9 10/11/2023 0407   PROT 7.4 08/08/2023 0930   ALBUMIN 4.4 08/08/2023 0930   AST 17 08/08/2023 0930   ALT 10 08/08/2023  0930   ALKPHOS 44 08/08/2023 0930   BILITOT 0.6 08/08/2023 0930   GFRNONAA >60 10/11/2023 0407   GFRNONAA 91 08/25/2020 1546   GFRAA 106 08/25/2020 1546      Latest Ref Rng & Units 08/08/2023    9:30 AM 12/08/2022    1:44 PM 08/25/2020    3:46 PM  Hepatic Function  Total Protein 6.0 - 8.3 g/dL 7.4  7.5  6.5   Albumin 3.5 - 5.2 g/dL 4.4  4.7    AST 0 - 37 U/L 17  18  14    ALT 0 - 35 U/L 10  13  8    Alk Phosphatase 39 - 117 U/L 44  38    Total Bilirubin 0.2 - 1.2 mg/dL 0.6  0.5  0.4       Current Medications:   Current Outpatient Medications (Endocrine & Metabolic):    levonorgestrel (LILETTA, 52 MG,) 20.1 MCG/DAY IUD IUD, 1 each by Intrauterine route once.    Current Outpatient Medications (Analgesics):    acetaminophen  (TYLENOL ) 500 MG tablet, Take 1,000 mg by mouth as needed.   Current Outpatient Medications (Other):    amoxicillin -clavulanate (AUGMENTIN ) 875-125 MG tablet, Take 1 tablet by mouth 2 (two) times daily.   Misc Natural Products (TURMERIC, CURCUMIN, PO), Take 1 tablet by mouth daily.   Omega-3 Fatty Acids (FISH OIL PO), Take 2 tablets by mouth daily.   ondansetron  (ZOFRAN -ODT) 8 MG disintegrating tablet, Take 1 tablet (8 mg total) by mouth every 8 (eight) hours as needed for nausea or vomiting.   pantoprazole  (PROTONIX ) 40 MG tablet, Take 1 tablet (40 mg total) by mouth daily. Take 20-30 minutes before meal.   Sodium Sulfate-Mag Sulfate-KCl (SUTAB ) 1479-225-188 MG TABS, Use as directed for colonoscopy.   Sofpironium  Bromide (SOFDRA ) 12.45 % GEL, Apply 1 Application topically at bedtime. Apply to both feet and between toes.   traZODone  (DESYREL ) 50 MG tablet, Take 50 mg by mouth at bedtime.  Medical History:  Past Medical History:  Diagnosis Date   Anxiety    Depression    Lupus    Sjogren's syndrome (HCC)    Allergies:  Allergies  Allergen Reactions   Latex Rash   Sulfamethoxazole-Trimethoprim Rash and Swelling     Surgical History:  She  has a  past surgical history that includes Cystoscopy; Laparoscopic bilateral salpingectomy (N/A, 02/13/2023); Incision and drainage perirectal abscess (N/A, 10/10/2023); Rectal exam under anesthesia (N/A, 10/10/2023); and Abscess drainage. Family History:  Her family history includes Cancer in her maternal grandmother and paternal grandfather; Hashimoto's thyroiditis in her sister; Thyroid disease in her brother, mother, and sister.  REVIEW OF SYSTEMS  : All other systems reviewed and negative except where noted in the History of Present Illness.  PHYSICAL EXAM: BP 110/78   Pulse 81   Ht 5' 6 (1.676 m)   Wt 133 lb 8 oz (60.6 kg)   BMI 21.55 kg/m  Physical Exam   GENERAL APPEARANCE: Well nourished, in no apparent distress.  HEENT: No cervical lymphadenopathy, unremarkable thyroid, sclerae anicteric, conjunctiva pink. RESPIRATORY: Respiratory effort normal, breath sounds equal bilaterally without rales, rhonchi, or wheezing. CARDIO: Regular rate and rhythm with no murmurs, rubs, or gallops, peripheral pulses intact. ABDOMEN: Soft, non-distended, active bowel sounds in all four quadrants, no tenderness to palpation, no rebound, no mass appreciated. RECTAL: Rectum without induration or masses, negative for blood, no stool, no discharge expressed. Induration towards rectum and perineum with granulation tissue around it, more pronounced on the left side. MUSCULOSKELETAL: Full range of motion, normal gait, without edema. SKIN: Dry, intact without rashes or lesions. No jaundice. NEURO: Alert, oriented, no focal deficits. PSYCH: Cooperative, normal mood and affect.      Alan JONELLE Coombs, PA-C 3:23 PM

## 2023-10-19 ENCOUNTER — Ambulatory Visit (INDEPENDENT_AMBULATORY_CARE_PROVIDER_SITE_OTHER): Admitting: Physician Assistant

## 2023-10-19 ENCOUNTER — Other Ambulatory Visit: Payer: Self-pay | Admitting: Physician Assistant

## 2023-10-19 ENCOUNTER — Other Ambulatory Visit (INDEPENDENT_AMBULATORY_CARE_PROVIDER_SITE_OTHER)

## 2023-10-19 ENCOUNTER — Encounter: Payer: Self-pay | Admitting: Physician Assistant

## 2023-10-19 VITALS — BP 110/78 | HR 81 | Ht 66.0 in | Wt 133.5 lb

## 2023-10-19 DIAGNOSIS — M3501 Sicca syndrome with keratoconjunctivitis: Secondary | ICD-10-CM

## 2023-10-19 DIAGNOSIS — K611 Rectal abscess: Secondary | ICD-10-CM

## 2023-10-19 DIAGNOSIS — R140 Abdominal distension (gaseous): Secondary | ICD-10-CM

## 2023-10-19 DIAGNOSIS — K6289 Other specified diseases of anus and rectum: Secondary | ICD-10-CM

## 2023-10-19 DIAGNOSIS — L930 Discoid lupus erythematosus: Secondary | ICD-10-CM

## 2023-10-19 DIAGNOSIS — R935 Abnormal findings on diagnostic imaging of other abdominal regions, including retroperitoneum: Secondary | ICD-10-CM

## 2023-10-19 DIAGNOSIS — M329 Systemic lupus erythematosus, unspecified: Secondary | ICD-10-CM | POA: Diagnosis not present

## 2023-10-19 LAB — BASIC METABOLIC PANEL WITH GFR
BUN: 13 mg/dL (ref 6–23)
CO2: 28 meq/L (ref 19–32)
Calcium: 9.3 mg/dL (ref 8.4–10.5)
Chloride: 102 meq/L (ref 96–112)
Creatinine, Ser: 0.8 mg/dL (ref 0.40–1.20)
GFR: 98.42 mL/min (ref >60.00)
Glucose, Bld: 85 mg/dL (ref 70–99)
Potassium: 3.9 meq/L (ref 3.5–5.1)
Sodium: 139 meq/L (ref 135–145)

## 2023-10-19 LAB — HEPATIC FUNCTION PANEL
ALT: 14 U/L (ref 0–35)
AST: 18 U/L (ref 0–37)
Albumin: 4.6 g/dL (ref 3.5–5.2)
Alkaline Phosphatase: 39 U/L (ref 39–117)
Bilirubin, Direct: 0.1 mg/dL (ref 0.0–0.3)
Total Bilirubin: 0.4 mg/dL (ref 0.2–1.2)
Total Protein: 7.6 g/dL (ref 6.0–8.3)

## 2023-10-19 LAB — CBC WITH DIFFERENTIAL/PLATELET
Basophils Absolute: 0.1 K/uL (ref 0.0–0.1)
Basophils Relative: 1.1 % (ref 0.0–3.0)
Eosinophils Absolute: 0.1 K/uL (ref 0.0–0.7)
Eosinophils Relative: 1.9 % (ref 0.0–5.0)
HCT: 40.1 % (ref 36.0–46.0)
Hemoglobin: 13.2 g/dL (ref 12.0–15.0)
Lymphocytes Relative: 31.1 % (ref 12.0–46.0)
Lymphs Abs: 1.5 K/uL (ref 0.7–4.0)
MCHC: 32.9 g/dL (ref 30.0–36.0)
MCV: 86.4 fl (ref 78.0–100.0)
Monocytes Absolute: 0.4 K/uL (ref 0.1–1.0)
Monocytes Relative: 7.5 % (ref 3.0–12.0)
Neutro Abs: 2.9 K/uL (ref 1.4–7.7)
Neutrophils Relative %: 58.4 % (ref 43.0–77.0)
Platelets: 323 K/uL (ref 150.0–400.0)
RBC: 4.64 Mil/uL (ref 3.87–5.11)
RDW: 13.5 % (ref 11.5–15.5)
WBC: 4.9 K/uL (ref 4.0–10.5)

## 2023-10-19 LAB — C-REACTIVE PROTEIN: CRP: <1 mg/dL (ref 0.5–20.0)

## 2023-10-19 LAB — SEDIMENTATION RATE: Sed Rate: 18 mm/h (ref 0–20)

## 2023-10-19 NOTE — Patient Instructions (Addendum)
 Your provider has requested that you go to the basement level for lab work before leaving today. Press B on the elevator. The lab is located at the first door on the left as you exit the elevator.  Due to recent changes in healthcare laws, you may see the results of your imaging and laboratory studies on MyChart before your provider has had a chance to review them.  We understand that in some cases there may be results that are confusing or concerning to you. Not all laboratory results come back in the same time frame and the provider may be waiting for multiple results in order to interpret others.  Please give us  48 hours in order for your provider to thoroughly review all the results before contacting the office for clarification of your results.    VISIT SUMMARY:  You came in today for a follow-up visit and to prepare for your upcoming colonoscopy. We discussed your ongoing treatment for perianal Crohn's disease and reviewed your symptoms and medical history. Your current medication, Augmentin , seems to be working well, and we have planned further evaluations to better understand your condition.  YOUR PLAN:  -PERIANAL ABSCESS WITH FISTULA: A perianal abscess is a collection of pus near the anus, often caused by infection. Your condition is improving with the current treatment, and the presence of good granulation tissue indicates healing. Continue taking Augmentin  for at least two more weeks, and we will reassess after your colonoscopy. We will also perform a repeat complete blood count (CBC) and metabolic panel. If Crohn's disease is ruled out, we may refer you to dermatology for a skin biopsy.  -SUSPECTED CROHN'S DISEASE: Crohn's disease is a type of inflammatory bowel disease that can cause inflammation in different parts of the digestive tract. Although your imaging and lab tests have been negative for bowel inflammation, the presence of perianal abscesses suggests Crohn's disease. We will  proceed with a colonoscopy and an upper endoscopy (EGD) on September 4th to confirm the diagnosis. If Crohn's is confirmed, we discussed the potential use of Remicade, a medication that can help manage the disease but comes with risks such as infection, psoriasis, and lymphoma. We also recommend vaccinations for shingles, pneumonia, and annual flu shots due to the risk of immunosuppression from the treatment.  INSTRUCTIONS:  Please continue taking Augmentin  as prescribed and follow up with the colonoscopy and EGD on September 4th with Doctor McGreal. We will reassess your condition after the procedure and perform a repeat CBC and metabolic panel. If Crohn's disease is ruled out, we may refer you to dermatology for a skin biopsy. Additionally, please consider getting vaccinated for shingles, pneumonia, and the flu to protect yourself from infections.   Thank you for trusting me with your gastrointestinal care!   Alan Coombs, PA-C   _______________________________________________________  If your blood pressure at your visit was 140/90 or greater, please contact your primary care physician to follow up on this.  _______________________________________________________  If you are age 21 or older, your body mass index should be between 23-30. Your Body mass index is 21.55 kg/m. If this is out of the aforementioned range listed, please consider follow up with your Primary Care Provider.  If you are age 25 or younger, your body mass index should be between 19-25. Your Body mass index is 21.55 kg/m. If this is out of the aformentioned range listed, please consider follow up with your Primary Care Provider.   ________________________________________________________  The [facility] providers would like to  encourage you to use MYCHART to communicate with providers for non-urgent requests or questions.  Due to long hold times on the telephone, sending your provider a message by [facility] may be a  faster and more efficient way to get a response.  Please allow 48 business hours for a response.  Please remember that this is for non-urgent requests.  _______________________________________________________  [facility] is using a team-based approach to care.  Your team is made up of your doctor and two to three APPS. Our APPS (Nurse Practitioners and Physician Assistants) work with your physician to ensure care continuity for you. They are fully qualified to address your health concerns and develop a treatment plan. They communicate directly with your gastroenterologist to care for you. Seeing the Advanced Practice Practitioners on your physician's team can help you by facilitating care more promptly, often allowing for earlier appointments, access to diagnostic testing, procedures, and other specialty referrals.

## 2023-10-22 LAB — TISSUE TRANSGLUTAMINASE, IGA: (tTG) Ab, IgA: <1 U/mL

## 2023-10-22 LAB — IGA: Immunoglobulin A: 237 mg/dL (ref 47–310)

## 2023-10-22 LAB — HOUSE ACCOUNT TRACKING

## 2023-10-22 LAB — THIOPURINE S-METHYLTRANSFERASE (TPMT) GENOTYPE

## 2023-10-22 LAB — EXTRA SPECIMEN

## 2023-10-24 ENCOUNTER — Ambulatory Visit: Payer: Self-pay | Admitting: Physician Assistant

## 2023-10-24 ENCOUNTER — Other Ambulatory Visit

## 2023-10-24 ENCOUNTER — Ambulatory Visit: Payer: Self-pay

## 2023-10-24 DIAGNOSIS — K611 Rectal abscess: Secondary | ICD-10-CM

## 2023-10-24 DIAGNOSIS — R935 Abnormal findings on diagnostic imaging of other abdominal regions, including retroperitoneum: Secondary | ICD-10-CM | POA: Diagnosis not present

## 2023-10-24 DIAGNOSIS — K6289 Other specified diseases of anus and rectum: Secondary | ICD-10-CM | POA: Diagnosis not present

## 2023-10-25 ENCOUNTER — Telehealth: Payer: Self-pay | Admitting: Gastroenterology

## 2023-10-25 NOTE — Anesthesia Preprocedure Evaluation (Signed)
 Anesthesia Evaluation  Patient identified by MRN, date of birth, ID band Patient awake    Reviewed: Allergy & Precautions, H&P , NPO status , Patient's Chart, lab work & pertinent test results  Airway Mallampati: II  TM Distance: >3 FB Neck ROM: Full    Dental no notable dental hx. (+) Teeth Intact, Dental Advisory Given   Pulmonary Current Smoker   Pulmonary exam normal breath sounds clear to auscultation       Cardiovascular Exercise Tolerance: Good negative cardio ROS  Rhythm:Regular Rate:Normal     Neuro/Psych   Anxiety Depression    negative neurological ROS     GI/Hepatic negative GI ROS, Neg liver ROS,,,  Endo/Other  negative endocrine ROS    Renal/GU negative Renal ROS  negative genitourinary   Musculoskeletal   Abdominal   Peds  Hematology negative hematology ROS (+)   Anesthesia Other Findings   Reproductive/Obstetrics negative OB ROS                              Anesthesia Physical Anesthesia Plan  ASA: 2  Anesthesia Plan: MAC   Post-op Pain Management: Minimal or no pain anticipated   Induction: Intravenous  PONV Risk Score and Plan: 1 and Propofol  infusion  Airway Management Planned: Natural Airway and Simple Face Mask  Additional Equipment:   Intra-op Plan:   Post-operative Plan:   Informed Consent: I have reviewed the patients History and Physical, chart, labs and discussed the procedure including the risks, benefits and alternatives for the proposed anesthesia with the patient or authorized representative who has indicated his/her understanding and acceptance.     Dental advisory given  Plan Discussed with: CRNA  Anesthesia Plan Comments:          Anesthesia Quick Evaluation

## 2023-10-25 NOTE — Telephone Encounter (Signed)
 Patient called. She is scheduled for a colonoscopy with Dr. Suzann at the hospital tomorrow morning. She took the first dose of the Suprep this evening - within 30 minutes, states she starting sneezing, and then developed some hives that were self limited. Over the course of several minutes the hives went away but she is concerned about possible allergic reaction to Suprep and asked if she should take the second dose.  To be on the safe side, recommend she hold off on the second dose of the Suprep and told her how to do a Miralax prep, and she will go to the pharmacy to get what she needs to do that. Drink 1/2 Miralax prep at the time of her second dose is scheduled, and take additional Miralax prep if needed (she will get 2 x 32 oz bottles of gatordade to use with 238gm bottle of Miralax). Otherwise counseled her to take a 25mg  dose of benadryl at home just in case she had a mild allergic reaction. She agrees. She otherwise feels well and wishes to continue with prep this evening to keep her appointment tomorrow AM.  Inocente - just an FYI

## 2023-10-26 ENCOUNTER — Ambulatory Visit (HOSPITAL_BASED_OUTPATIENT_CLINIC_OR_DEPARTMENT_OTHER): Payer: Self-pay | Admitting: Anesthesiology

## 2023-10-26 ENCOUNTER — Ambulatory Visit (HOSPITAL_COMMUNITY)
Admission: RE | Admit: 2023-10-26 | Discharge: 2023-10-26 | Disposition: A | Discharge status: Home / Self Care | Attending: Pediatrics | Admitting: Pediatrics

## 2023-10-26 ENCOUNTER — Other Ambulatory Visit

## 2023-10-26 ENCOUNTER — Other Ambulatory Visit: Payer: Self-pay

## 2023-10-26 ENCOUNTER — Encounter (HOSPITAL_COMMUNITY)
Admission: RE | Disposition: A | Discharge status: Home / Self Care | Payer: Self-pay | Source: Home / Self Care | Attending: Pediatrics

## 2023-10-26 ENCOUNTER — Ambulatory Visit (HOSPITAL_COMMUNITY): Payer: Self-pay | Admitting: Anesthesiology

## 2023-10-26 ENCOUNTER — Encounter (HOSPITAL_COMMUNITY): Payer: Self-pay | Admitting: Pediatrics

## 2023-10-26 ENCOUNTER — Telehealth: Payer: Self-pay

## 2023-10-26 DIAGNOSIS — F17210 Nicotine dependence, cigarettes, uncomplicated: Secondary | ICD-10-CM | POA: Insufficient documentation

## 2023-10-26 DIAGNOSIS — K603 Anal fistula, unspecified: Secondary | ICD-10-CM

## 2023-10-26 DIAGNOSIS — R935 Abnormal findings on diagnostic imaging of other abdominal regions, including retroperitoneum: Secondary | ICD-10-CM | POA: Diagnosis not present

## 2023-10-26 DIAGNOSIS — R933 Abnormal findings on diagnostic imaging of other parts of digestive tract: Secondary | ICD-10-CM | POA: Diagnosis not present

## 2023-10-26 DIAGNOSIS — K610 Anal abscess: Secondary | ICD-10-CM | POA: Diagnosis not present

## 2023-10-26 DIAGNOSIS — K625 Hemorrhage of anus and rectum: Secondary | ICD-10-CM | POA: Diagnosis not present

## 2023-10-26 DIAGNOSIS — K5090 Crohn's disease, unspecified, without complications: Secondary | ICD-10-CM | POA: Diagnosis not present

## 2023-10-26 DIAGNOSIS — F419 Anxiety disorder, unspecified: Secondary | ICD-10-CM | POA: Insufficient documentation

## 2023-10-26 DIAGNOSIS — R109 Unspecified abdominal pain: Secondary | ICD-10-CM | POA: Diagnosis not present

## 2023-10-26 DIAGNOSIS — F418 Other specified anxiety disorders: Secondary | ICD-10-CM | POA: Diagnosis not present

## 2023-10-26 DIAGNOSIS — F32A Depression, unspecified: Secondary | ICD-10-CM | POA: Insufficient documentation

## 2023-10-26 DIAGNOSIS — R1010 Upper abdominal pain, unspecified: Secondary | ICD-10-CM | POA: Diagnosis not present

## 2023-10-26 DIAGNOSIS — K6289 Other specified diseases of anus and rectum: Secondary | ICD-10-CM

## 2023-10-26 DIAGNOSIS — K3189 Other diseases of stomach and duodenum: Secondary | ICD-10-CM | POA: Diagnosis not present

## 2023-10-26 HISTORY — PX: ESOPHAGOGASTRODUODENOSCOPY: SHX5428

## 2023-10-26 HISTORY — PX: BIOPSY OF SKIN SUBCUTANEOUS TISSUE AND/OR MUCOUS MEMBRANE: SHX6741

## 2023-10-26 HISTORY — PX: COLONOSCOPY: SHX5424

## 2023-10-26 SURGERY — COLONOSCOPY
Anesthesia: Monitor Anesthesia Care

## 2023-10-26 MED ORDER — PROPOFOL 10 MG/ML IV BOLUS
INTRAVENOUS | Status: DC | PRN
  Administered 2023-10-26 (×2): 100 mg via INTRAVENOUS

## 2023-10-26 MED ORDER — SODIUM CHLORIDE 0.9 % IV SOLN: INTRAVENOUS | Status: DC

## 2023-10-26 MED ORDER — PHENYLEPHRINE 80 MCG/ML (10ML) SYRINGE FOR IV PUSH (FOR BLOOD PRESSURE SUPPORT)
PREFILLED_SYRINGE | INTRAVENOUS | Status: DC | PRN
  Administered 2023-10-26: 80 ug via INTRAVENOUS

## 2023-10-26 MED ORDER — PROPOFOL 500 MG/50ML IV EMUL
INTRAVENOUS | Status: DC | PRN
  Administered 2023-10-26: 100 ug/kg/min via INTRAVENOUS

## 2023-10-26 MED ORDER — LIDOCAINE 2% (20 MG/ML) 5 ML SYRINGE
INTRAMUSCULAR | Status: DC | PRN
  Administered 2023-10-26: 100 mg via INTRAVENOUS

## 2023-10-26 NOTE — Op Note (Signed)
 [facility] Patient Name: Debbie [name] Procedure Date : 10/26/2023 MRN: [id number] Attending MD: Inocente Hausen , MD, [id number] Date of Birth: 1990/11/13 CSN: [id number] Age: 33 Admit Type: Inpatient Procedure:                Upper GI endoscopy Indications:              Abdominal pain, Rectal bleeding, abnormal CT of the                            GI tract and Abnormal MRI of the GI tract -perianal                            inflammation and concern for possible Crohn's                            disease Providers:                Inocente Hausen, MD, Haskel Chris, Technician,                            Gregoria Pierce, RN Referring MD:              Medicines:                Monitored Anesthesia Care Complications:            No immediate complications. Estimated blood loss:                            Minimal. Estimated Blood Loss:     Estimated blood loss was minimal. Procedure:                Pre-Anesthesia Assessment:                           - Prior to the procedure, a History and Physical                            was performed, and patient medications and                            allergies were reviewed. The patient's tolerance of                            previous anesthesia was also reviewed. The risks                            and benefits of the procedure and the sedation                            options and risks were discussed with the patient.                            All questions were answered, and informed consent  was obtained. Prior Anticoagulants: The patient has                            taken no anticoagulant or antiplatelet agents. ASA                            Grade Assessment: II - A patient with mild systemic                            disease. After reviewing the risks and benefits,                            the patient was deemed in satisfactory condition to                            undergo the  procedure.                           After obtaining informed consent, the endoscope was                            passed under direct vision. Throughout the                            procedure, the patient's blood pressure, pulse, and                            oxygen saturations were monitored continuously. The                            [id number]-[id number] ([id number]) Olympus endoscope was introduced                            through the mouth, and advanced to the second part                            of duodenum. The upper GI endoscopy was                            accomplished without difficulty. The patient                            tolerated the procedure well. Scope In: Scope Out: Findings:      The examined esophagus was normal.      The gastric body, gastric antrum, cardia (on retroflexion) and gastric       fundus (on retroflexion) were normal. Biopsies were taken with a cold       forceps for histology.      The duodenal bulb and second portion of the duodenum were normal.       Biopsies were taken with a cold forceps for histology. Impression:               - Normal esophagus.                           -  Normal gastric body, antrum, cardia and gastric                            fundus. Biopsied.                           - Normal duodenal bulb and second portion of the                            duodenum. Biopsied. Recommendation:           - Await pathology results.                           - Perform a colonoscopy today.                           - The findings and recommendations were discussed                            with the patient. Procedure Code(s):        --- Professional ---                           [phone], Esophagogastroduodenoscopy, flexible,                            transoral; with biopsy, single or multiple Diagnosis Code(s):        --- Professional ---                           R10.9, Unspecified abdominal pain                           R93.3, Abnormal  findings on diagnostic imaging of                            other parts of digestive tract CPT copyright 2022 [employer]. All rights reserved. The codes documented in this report are preliminary and upon coder review may  be revised to meet current compliance requirements. Inocente Hausen, MD 10/26/2023 7:48:48 AM This report has been signed electronically. Number of Addenda: 0

## 2023-10-26 NOTE — H&P (Signed)
 [facility] History and Physical   Primary Care Physician:  Allwardt, Mardy HERO, PA-C   Reason for Procedure:  Perianal abscess, concern for Crohn's disease  Plan:    EGD and colonoscopy     HPI: Debbie [name] is a 33 y.o. female undergoing EGD and colonoscopy for investigation of possible Crohn's disease in the setting of perianal abscess.  Carrissa developed the onset of perineal/perianal pain in June 2025.  Over the ensuing weeks the inflammation progressed and she developed perianal abscess for which she was admitted to the hospital in August 2025.  Underwent incision and drainage.  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  Patient reports ongoing drainage from her perianal region.  She is on Augmentin .  States there is some discomfort but has not had recurrent abscess.   Past Medical History:  Diagnosis Date   Anxiety    Depression    Lupus    Sjogren's syndrome (HCC)     Past Surgical History:  Procedure Laterality Date   ABCESS DRAINAGE     CYSTOSCOPY     INCISION AND DRAINAGE PERIRECTAL ABSCESS N/A 10/10/2023   Procedure: INCISION AND DRAINAGE, ABSCESS, PERIRECTAL;  Surgeon: Polly Cordella DELENA, MD;  Location: [facility] OR;  Service: General;  Laterality: N/A;  bilateral   LAPAROSCOPIC BILATERAL SALPINGECTOMY N/A 02/13/2023   Procedure: LAPAROSCOPIC BILATERAL SALPINGECTOMY;  Surgeon: Erik Kieth BROCKS, MD;  Location[facility];  Service: Gynecology;  Laterality: N/A;   RECTAL EXAM UNDER ANESTHESIA N/A 10/10/2023   Procedure: EXAM UNDER ANESTHESIA, RECTUM;  Surgeon: Polly Cordella DELENA, MD;  Location: [facility] OR;  Service: General;  Laterality: N/A;    Prior to Admission medications   Medication Sig Start Date End Date Taking? Authorizing Provider  acetaminophen  (TYLENOL ) 500 MG tablet Take 1,000 mg by mouth as needed.   Yes [provider]  amoxicillin -clavulanate (AUGMENTIN ) 875-125 MG tablet  Take 1 tablet by mouth 2 (two) times daily. 10/12/23 11/12/23 Yes Rohnan Bartleson, Inocente HERO, MD  levonorgestrel (LILETTA, 52 MG,) 20.1 MCG/DAY IUD IUD 1 each by Intrauterine route once.   Yes [provider]  Misc Natural Products (TURMERIC, CURCUMIN, PO) Take 1 tablet by mouth daily.   Yes [provider]  Omega-3 Fatty Acids (FISH OIL PO) Take 2 tablets by mouth daily.   Yes [provider]  Sodium Sulfate-Mag Sulfate-KCl (SUTAB ) 856 226 0239 MG TABS Use as directed for colonoscopy. 10/12/23  Yes Lauraine Crespo, Inocente HERO, MD  Sofpironium  Bromide (SOFDRA ) 12.45 % GEL Apply 1 Application topically at bedtime. Apply to both feet and between toes. 08/29/23  Yes Alm Delon SAILOR, DO  traZODone  (DESYREL ) 50 MG tablet Take 50 mg by mouth at bedtime.   Yes [provider]  ondansetron  (ZOFRAN -ODT) 8 MG disintegrating tablet Take 1 tablet (8 mg total) by mouth every 8 (eight) hours as needed for nausea or vomiting. 10/06/23   Wilberto Console, Inocente HERO, MD  pantoprazole  (PROTONIX ) 40 MG tablet Take 1 tablet (40 mg total) by mouth daily. Take 20-30 minutes before meal. 09/19/23   Kirstan Fentress, Inocente HERO, MD    Current Facility-Administered Medications  Medication Dose Route Frequency Provider Last Rate Last Admin   0.9 %  sodium chloride  infusion   Intravenous Continuous Suzann Inocente HERO, MD 20 mL/hr at 10/26/23 0652 New Bag at 10/26/23 0652    Allergies as of 10/12/2023 - Review Complete 10/10/2023  Allergen Reaction Noted   Latex Rash 02/23/2021   Sulfamethoxazole-trimethoprim Rash  and Swelling 11/15/2017    Family History  Problem Relation Age of Onset   Thyroid disease Mother    Cancer Maternal Grandmother    Cancer Paternal Grandfather    Hashimoto's thyroiditis Sister    Thyroid disease Sister    Thyroid disease Brother     Social History   Socioeconomic History   Marital status: Divorced    Spouse name: Not on file   Number of children: 0   Years of education: Not on file   Highest  education level: Not on file  Occupational History   Occupation: Child psychotherapist  Tobacco Use   Smoking status: Some Days    Types: Cigarettes   Smokeless tobacco: Never   Tobacco comments:    Very rarely, social smoker  Vaping Use   Vaping status: Some Days  Substance and Sexual Activity   Alcohol use: Yes    Comment: Rarely   Drug use: Not Currently    Types: Marijuana   Sexual activity: Yes    Birth control/protection: I.U.D., Surgical  Other Topics Concern   Not on file  Social History Narrative   Not on file   Social Drivers of Health   Financial Resource Strain: Not on file  Food Insecurity: No Food Insecurity (10/10/2023)   Hunger Vital Sign    Worried About Running Out of Food in the Last Year: Never true    Ran Out of Food in the Last Year: Never true  Transportation Needs: No Transportation Needs (10/10/2023)   PRAPARE - Administrator, Civil Service (Medical): No    Lack of Transportation (Non-Medical): No  Physical Activity: Not on file  Stress: Not on file  Social Connections: Not on file  Intimate Partner Violence: Not At Risk (10/10/2023)   Humiliation, Afraid, Rape, and Kick questionnaire    Fear of Current or Ex-Partner: No    Emotionally Abused: No    Physically Abused: No    Sexually Abused: No    Review of Systems:  All other review of systems negative except as mentioned in the HPI.  Physical Exam: Vital signs BP 112/82   Pulse 72   Temp (!) 97.4 F (36.3 C) (Temporal)   Resp 12   Ht 5' 6 (1.676 m)   Wt 60.3 kg   SpO2 100%   BMI 21.47 kg/m   General:   Alert,  Well-developed, well-nourished, pleasant and cooperative in NAD Lungs:  Clear throughout to auscultation.   Heart:  Regular rate and rhythm; no murmurs, clicks, rubs,  or gallops. Abdomen:  Soft, nontender and nondistended. Normal bowel sounds.   Neuro/Psych:  Normal mood and affect. A and O x 3  Inocente Hausen, MD [facility]

## 2023-10-26 NOTE — Telephone Encounter (Signed)
 Received fax from [facility] that patient's TPMT lab was not performed. Called [facility] & was informed that our lab needed to send 4 mL of whole blood in 2 lavender tops, both refrigerated. Spoke with Darice in the lab & made her aware. She asked that when patient comes for labs to request for her. Alan, [place] & patient made aware. Apologized for the inconvenience.

## 2023-10-26 NOTE — Transfer of Care (Signed)
 Immediate Anesthesia Transfer of Care Note  Patient: Debbie [name]  Procedure(s) Performed: COLONOSCOPY EGD (ESOPHAGOGASTRODUODENOSCOPY) BIOPSY, SKIN, SUBCUTANEOUS TISSUE, OR MUCOUS MEMBRANE  Patient Location: PACU and Endoscopy Unit  Anesthesia Type:MAC  Level of Consciousness: awake, alert , and oriented  Airway & Oxygen Therapy: Patient Spontanous Breathing  Post-op Assessment: Report given to RN and Post -op Vital signs reviewed and stable  Post vital signs: Reviewed and stable  Last Vitals:  Vitals Value Taken Time  BP 98/57 10/26/23 08:13  Temp 36.1 C 10/26/23 08:13  Pulse 74 10/26/23 08:14  Resp 12 10/26/23 08:14  SpO2 99 % 10/26/23 08:14  Vitals shown include unfiled device data.  Last Pain:  Vitals:   10/26/23 0813  TempSrc: Temporal  PainSc: 0-No pain         Complications: No notable events documented.

## 2023-10-26 NOTE — Discharge Instructions (Signed)

## 2023-10-26 NOTE — Op Note (Signed)
 [facility] Patient Name: Debbie [name] Procedure Date : 10/26/2023 MRN: [id number] Attending MD: Inocente Hausen , MD, [id number] Date of Birth: [birth year] CSN: [id number] Age: 33 Admit Type: Inpatient Procedure:                Colonoscopy Indications:              Abdominal pain, Rectal bleeding, Abnormal CT of the                            GI tract and Abnormal MRI of the GI tract -perianal                            inflammation, Anorectal fistula, Rectal pain Providers:                Inocente Hausen, MD, Haskel Chris, Technician,                            Gregoria Pierce, RN Referring MD:              Medicines:                Monitored Anesthesia Care Complications:            No immediate complications. Estimated blood loss:                            Minimal. Estimated Blood Loss:     Estimated blood loss was minimal. Procedure:                Pre-Anesthesia Assessment:                           - Prior to the procedure, a History and Physical                            was performed, and patient medications and                            allergies were reviewed. The patient's tolerance of                            previous anesthesia was also reviewed. The risks                            and benefits of the procedure and the sedation                            options and risks were discussed with the patient.                            All questions were answered, and informed consent                            was obtained. Prior Anticoagulants: The patient has  taken no anticoagulant or antiplatelet agents. ASA                            Grade Assessment: II - A patient with mild systemic                            disease. After reviewing the risks and benefits,                            the patient was deemed in satisfactory condition to                            undergo the procedure.                           - Prior  to the procedure, a History and Physical                            was performed, and patient medications and                            allergies were reviewed. The patient's tolerance of                            previous anesthesia was also reviewed. The risks                            and benefits of the procedure and the sedation                            options and risks were discussed with the patient.                            All questions were answered, and informed consent                            was obtained. Prior Anticoagulants: The patient has                            taken no anticoagulant or antiplatelet agents. ASA                            Grade Assessment: II - A patient with mild systemic                            disease. After reviewing the risks and benefits,                            the patient was deemed in satisfactory condition to                            undergo the procedure.  After obtaining informed consent, the colonoscope                            was passed under direct vision. Throughout the                            procedure, the patient's blood pressure, pulse, and                            oxygen saturations were monitored continuously. The                            PCF-HQ190L ([id number]) Olympus colonoscope was                            introduced through the anus and advanced to the                            terminal ileum. The colonoscopy was performed                            without difficulty. The patient tolerated the                            procedure well. The quality of the bowel                            preparation was good. The terminal ileum, ileocecal                            valve, appendiceal orifice, and rectum were                            photographed. Scope In: 7:51:18 AM Scope Out: 8:07:47 AM Scope Withdrawal Time: 0 hours 13 minutes 4 seconds  Total Procedure Duration: 0  hours 16 minutes 29 seconds  Findings:      The perianal exam findings included bilateral wounds on buttocks related       to previous I&D and perianal fistula.      The digital rectal exam was normal. Pertinent negatives include normal       sphincter tone and no palpable rectal lesions.      Normal mucosa was found in the entire colon. Biopsies were taken with a       cold forceps for histology.      The terminal ileum appeared normal. Biopsies were taken with a cold       forceps for histology.      The retroflexed view of the distal rectum and anal verge was normal and       showed no anal or rectal abnormalities. Impression:               - Perianal fistula and wound secondary to previous                            I&D found on perianal exam.                           -  Normal mucosa in the entire examined colon.                            Biopsied.                           - The examined portion of the ileum was normal.                            Biopsied.                           - MRI and physical exam findings suggestive of                            isolated perianal Crohn's disease. With the                            presence of fistulizing disease seen on MRI,                            findings are less likely to represent hidradenitis                            suppurativa. Recommendation:           - Discharge patient to home (ambulatory).                           - Await pathology results.                           - Continue Augmentin .                           - Patient is scheduled to follow-up with surgery                            next week. Will need EUA with seton placement. Once                            that has been completed we will initiate treatment                            with infliximab + thiopurine.                           - The findings and recommendations were discussed                            with the patient.                            - Patient has a contact number available for                            emergencies. The  signs and symptoms of potential                            delayed complications were discussed with the                            patient. Return to normal activities tomorrow.                            Written discharge instructions were provided to the                            patient. Procedure Code(s):        --- Professional ---                           [phone], Colonoscopy, flexible; with biopsy, single                            or multiple Diagnosis Code(s):        --- Professional ---                           K60.3, Anal fistula                           R10.9, Unspecified abdominal pain                           K62.5, Hemorrhage of anus and rectum                           R93.3, Abnormal findings on diagnostic imaging of                            other parts of digestive tract                           K62.89, Other specified diseases of anus and rectum                           K60.5, Anorectal fistula CPT copyright 2022 American Medical Association. All rights reserved. The codes documented in this report are preliminary and upon coder review may  be revised to meet current compliance requirements. Inocente Hausen, MD 10/26/2023 8:15:37 AM This report has been signed electronically. Number of Addenda: 0

## 2023-10-26 NOTE — Anesthesia Postprocedure Evaluation (Signed)
 Anesthesia Post Note  Patient: Debbie [name]  Procedure(s) Performed: COLONOSCOPY EGD (ESOPHAGOGASTRODUODENOSCOPY) BIOPSY, SKIN, SUBCUTANEOUS TISSUE, OR MUCOUS MEMBRANE     Patient location during evaluation: Endoscopy Anesthesia Type: MAC Level of consciousness: awake and alert Pain management: pain level controlled Vital Signs Assessment: post-procedure vital signs reviewed and stable Respiratory status: spontaneous breathing, nonlabored ventilation and respiratory function stable Cardiovascular status: stable and blood pressure returned to baseline Postop Assessment: no apparent nausea or vomiting Anesthetic complications: no   No notable events documented.  Last Vitals:  Vitals:   10/26/23 0820 10/26/23 0830  BP: 102/65 100/66  Pulse: 71 73  Resp: 13 (!) 21  Temp:    SpO2: 96% 100%    Last Pain:  Vitals:   10/26/23 0820  TempSrc:   PainSc: 0-No pain                 Warner Laduca,W. EDMOND

## 2023-10-27 LAB — SURGICAL PATHOLOGY

## 2023-10-29 ENCOUNTER — Encounter (HOSPITAL_COMMUNITY): Payer: Self-pay | Admitting: Pediatrics

## 2023-10-29 LAB — THIOPURINE METHYLTRANSFERASE (TPMT), RBC: Thiopurine Methyltransferase, RBC: 17 nmol/h/mL

## 2023-10-30 ENCOUNTER — Ambulatory Visit: Payer: Self-pay | Admitting: Physician Assistant

## 2023-10-31 ENCOUNTER — Ambulatory Visit: Payer: Self-pay | Admitting: Pediatrics

## 2023-10-31 ENCOUNTER — Ambulatory Visit: Admitting: Physician Assistant

## 2023-11-01 ENCOUNTER — Ambulatory Visit: Payer: Self-pay | Admitting: Physician Assistant

## 2023-11-01 LAB — THIOPURINE METHYLTRANSFERASE (TPMT), RBC: Thiopurine Methyltransferase, RBC: 16 nmol/h/mL

## 2023-11-06 ENCOUNTER — Other Ambulatory Visit: Payer: Self-pay | Admitting: Pediatrics

## 2023-11-06 ENCOUNTER — Other Ambulatory Visit (HOSPITAL_COMMUNITY): Payer: Self-pay

## 2023-11-06 MED ORDER — AMOXICILLIN-POT CLAVULANATE 875-125 MG PO TABS
1.0000 | ORAL_TABLET | Freq: Two times a day (BID) | ORAL | 0 refills | Status: DC
  Filled 2023-11-06: qty 60, 30d supply, fill #0

## 2023-11-07 DIAGNOSIS — F331 Major depressive disorder, recurrent, moderate: Secondary | ICD-10-CM | POA: Diagnosis not present

## 2023-11-14 ENCOUNTER — Ambulatory Visit: Admitting: Dermatology

## 2023-11-14 DIAGNOSIS — F331 Major depressive disorder, recurrent, moderate: Secondary | ICD-10-CM | POA: Diagnosis not present

## 2023-11-28 DIAGNOSIS — F331 Major depressive disorder, recurrent, moderate: Secondary | ICD-10-CM | POA: Diagnosis not present

## 2023-12-05 ENCOUNTER — Ambulatory Visit: Payer: Self-pay | Admitting: General Surgery

## 2023-12-05 DIAGNOSIS — F331 Major depressive disorder, recurrent, moderate: Secondary | ICD-10-CM | POA: Diagnosis not present

## 2023-12-05 NOTE — H&P (Signed)
 PROVIDER:  BERNARDA WANDA NED, MD  MRN: [id number] DOB: 05-11-1990 DATE OF ENCOUNTER: 12/05/2023 Interval History:   Debbie [name] is a 33 y.o. female with lupus and Sjogren syndrome, who presented to the ER on 09/13/2023 with perianal pain, mild rectal bleeding, and intermittent abdominal pain since June 2025 and CT scan showed multiple areas of soft tissue attenuation in the bilateral ischial rectal fossa suggestive of phlegmonous changes.  She was prescribed Keflex .  She saw Dr. Suzann, GI, on 09/19/2023 who ordered MR enterography but this was inconclusive.  She then represented to the ER on 10/10/2023  CT scan revealed a bilobed perianal fluid collection with gas that are CONNECTED to the anterior rectum.  She underwent incision and drainage of bilateral abscesses on 10/10/2023 by Dr. Polly.  The operation note states there was purulence within the anal canal but no obvious fistula was identified.  Discharged postop day 1.  She underwent colonoscopy on 10/26/2023.  Biopsies were taken to further evaluate for isolated perianal Crohn's disease, although there was normal mucosa in the entire colon and exam and portion of the ileum.  Biopsies were benign.  GI recommended EUA with seton placement and then treatment with infliximab and thiopurine.  She denies any pain.  She states drainage is minimal and has not noted any drainage in the past few days.  Denies fever.  Past Medical History:  Diagnosis Date   Anxiety    Depression    Lupus    Sjogren's syndrome     Past Surgical History:  Procedure Laterality Date   ABCESS DRAINAGE     BIOPSY OF SKIN SUBCUTANEOUS TISSUE AND/OR MUCOUS MEMBRANE  10/26/2023   Procedure: BIOPSY, SKIN, SUBCUTANEOUS TISSUE, OR MUCOUS MEMBRANE;  Surgeon: Suzann Inocente HERO, MD;  Location: [facility] ENDOSCOPY;  Service: Gastroenterology;;   COLONOSCOPY N/A 10/26/2023   Procedure: COLONOSCOPY;  Surgeon: Suzann Inocente HERO, MD;  Location: [facility] ENDOSCOPY;  Service: Gastroenterology;   Laterality: N/A;   CYSTOSCOPY     ESOPHAGOGASTRODUODENOSCOPY N/A 10/26/2023   Procedure: EGD (ESOPHAGOGASTRODUODENOSCOPY);  Surgeon: Suzann Inocente HERO, MD;  Location: [facility] ENDOSCOPY;  Service: Gastroenterology;  Laterality: N/A;   INCISION AND DRAINAGE PERIRECTAL ABSCESS N/A 10/10/2023   Procedure: INCISION AND DRAINAGE, ABSCESS, PERIRECTAL;  Surgeon: Polly Cordella DELENA, MD;  Location: [facility] OR;  Service: General;  Laterality: N/A;  bilateral   LAPAROSCOPIC BILATERAL SALPINGECTOMY N/A 02/13/2023   Procedure: LAPAROSCOPIC BILATERAL SALPINGECTOMY;  Surgeon: Erik Kieth BROCKS, MD;  Location: [facility];  Service: Gynecology;  Laterality: N/A;   RECTAL EXAM UNDER ANESTHESIA N/A 10/10/2023   Procedure: EXAM UNDER ANESTHESIA, RECTUM;  Surgeon: Polly Cordella DELENA, MD;  Location: [facility] OR;  Service: General;  Laterality: N/A;    Family History  Problem Relation Age of Onset   Thyroid disease Mother    Cancer Maternal Grandmother    Cancer Paternal Grandfather    Hashimoto's thyroiditis Sister    Thyroid disease Sister    Thyroid disease Brother     Social History   Socioeconomic History   Marital status: Divorced    Spouse name: Not on file   Number of children: 0   Years of education: Not on file   Highest education level: Not on file  Occupational History   Occupation: Child psychotherapist  Tobacco Use   Smoking status: Some Days    Types: Cigarettes   Smokeless tobacco: Never   Tobacco comments:    Very rarely, social smoker  Vaping Use   Vaping status: Some  Days  Substance and Sexual Activity   Alcohol use: Yes    Comment: Rarely   Drug use: Not Currently    Types: Marijuana   Sexual activity: Yes    Birth control/protection: I.U.D., Surgical  Other Topics Concern   Not on file  Social History Narrative   Not on file   Social Drivers of Health   Financial Resource Strain: Not on file  Food Insecurity: No Food Insecurity (10/10/2023)   Hunger Vital Sign    Worried  About Running Out of Food in the Last Year: Never true    Ran Out of Food in the Last Year: Never true  Transportation Needs: No Transportation Needs (10/10/2023)   PRAPARE - Administrator, Civil Service (Medical): No    Lack of Transportation (Non-Medical): No  Physical Activity: Not on file  Stress: Not on file  Social Connections: Not on file  Intimate Partner Violence: Not At Risk (10/10/2023)   Humiliation, Afraid, Rape, and Kick questionnaire    Fear of Current or Ex-Partner: No    Emotionally Abused: No    Physically Abused: No    Sexually Abused: No     Current Outpatient Medications:    acetaminophen  (TYLENOL ) 500 MG tablet, Take 1,000 mg by mouth as needed., Disp: , Rfl:    amoxicillin -clavulanate (AUGMENTIN ) 875-125 MG tablet, Take 1 tablet by mouth 2 (two) times daily., Disp: 60 tablet, Rfl: 0   levonorgestrel (LILETTA, 52 MG,) 20.1 MCG/DAY IUD IUD, 1 each by Intrauterine route once., Disp: , Rfl:    Misc Natural Products (TURMERIC, CURCUMIN, PO), Take 1 tablet by mouth daily., Disp: , Rfl:    Omega-3 Fatty Acids (FISH OIL PO), Take 2 tablets by mouth daily., Disp: , Rfl:    ondansetron  (ZOFRAN -ODT) 8 MG disintegrating tablet, Take 1 tablet (8 mg total) by mouth every 8 (eight) hours as needed for nausea or vomiting., Disp: 20 tablet, Rfl: 1   pantoprazole  (PROTONIX ) 40 MG tablet, Take 1 tablet (40 mg total) by mouth daily. Take 20-30 minutes before meal., Disp: 90 tablet, Rfl: 1   Sodium Sulfate-Mag Sulfate-KCl (SUTAB ) 1479-225-188 MG TABS, Use as directed for colonoscopy., Disp: 24 tablet, Rfl: 0   Sofpironium  Bromide (SOFDRA ) 12.45 % GEL, Apply 1 Application topically at bedtime. Apply to both feet and between toes., Disp: 40.2 mL, Rfl: 4   traZODone  (DESYREL ) 50 MG tablet, Take 50 mg by mouth at bedtime., Disp: , Rfl:   Allergies  Allergen Reactions   Latex Rash   Sulfamethoxazole-Trimethoprim Rash and Swelling    Review of Systems - Negative except as  stated above    Medications:   Current Outpatient Medications on File Prior to Visit  Medication Sig Dispense Refill   amoxicillin -clavulanate (AUGMENTIN ) 875-125 mg tablet Take 1 tablet by mouth 2 (two) times daily     omega 3-dha-epa-fish oil (FISH OIL) 1,000 (120-180) mg Cap Take by mouth     pantoprazole  (PROTONIX ) 40 MG DR tablet Take 40 mg by mouth once daily     SOFDRA  12.45 % (72 mg /actuation) GlwP Apply 1 Application topically     No current facility-administered medications on file prior to visit.    Physical Examination:     General: Well-developed, well-nourished, in no acute distress.   Rectal: Small I&D sites on left and right posterior perianal regions are well-healed without erythema or drainage but she does have significant edema, especially on the left side.   No tenderness to palpation  A chaperone, Burnard Crete, LPN, was present during the exam.    Assessment and Plan:   Anal fistula, complex   Debbie [name] is status post incision and drainage of bilateral abscesses on 10/10/2023 by Dr. Polly.  No signs of ongoing infection on today's exam.  Amount of edema present and the fact that her wounds are not completely healed is concerning for ongoing fistula and possible Crohn's disease, even though biopsies have been negative.  I have recommend  EUA and seton placement. We would like to do this in the next couple weeks.    Bernarda JAYSON Ned, MD Colon and Rectal Surgery [facility]

## 2023-12-05 NOTE — H&P (View-Only) (Signed)
 PROVIDER:  BERNARDA WANDA NED, MD  MRN: [id number] DOB: 05-11-1990 DATE OF ENCOUNTER: 12/05/2023 Interval History:   Debbie [name] is a 33 y.o. female with lupus and Sjogren syndrome, who presented to the ER on 09/13/2023 with perianal pain, mild rectal bleeding, and intermittent abdominal pain since June 2025 and CT scan showed multiple areas of soft tissue attenuation in the bilateral ischial rectal fossa suggestive of phlegmonous changes.  She was prescribed Keflex .  She saw Dr. Suzann, GI, on 09/19/2023 who ordered MR enterography but this was inconclusive.  She then represented to the ER on 10/10/2023  CT scan revealed a bilobed perianal fluid collection with gas that are CONNECTED to the anterior rectum.  She underwent incision and drainage of bilateral abscesses on 10/10/2023 by Dr. Polly.  The operation note states there was purulence within the anal canal but no obvious fistula was identified.  Discharged postop day 1.  She underwent colonoscopy on 10/26/2023.  Biopsies were taken to further evaluate for isolated perianal Crohn's disease, although there was normal mucosa in the entire colon and exam and portion of the ileum.  Biopsies were benign.  GI recommended EUA with seton placement and then treatment with infliximab and thiopurine.  She denies any pain.  She states drainage is minimal and has not noted any drainage in the past few days.  Denies fever.  Past Medical History:  Diagnosis Date   Anxiety    Depression    Lupus    Sjogren's syndrome     Past Surgical History:  Procedure Laterality Date   ABCESS DRAINAGE     BIOPSY OF SKIN SUBCUTANEOUS TISSUE AND/OR MUCOUS MEMBRANE  10/26/2023   Procedure: BIOPSY, SKIN, SUBCUTANEOUS TISSUE, OR MUCOUS MEMBRANE;  Surgeon: Suzann Inocente HERO, MD;  Location: [facility] ENDOSCOPY;  Service: Gastroenterology;;   COLONOSCOPY N/A 10/26/2023   Procedure: COLONOSCOPY;  Surgeon: Suzann Inocente HERO, MD;  Location: [facility] ENDOSCOPY;  Service: Gastroenterology;   Laterality: N/A;   CYSTOSCOPY     ESOPHAGOGASTRODUODENOSCOPY N/A 10/26/2023   Procedure: EGD (ESOPHAGOGASTRODUODENOSCOPY);  Surgeon: Suzann Inocente HERO, MD;  Location: [facility] ENDOSCOPY;  Service: Gastroenterology;  Laterality: N/A;   INCISION AND DRAINAGE PERIRECTAL ABSCESS N/A 10/10/2023   Procedure: INCISION AND DRAINAGE, ABSCESS, PERIRECTAL;  Surgeon: Polly Cordella DELENA, MD;  Location: [facility] OR;  Service: General;  Laterality: N/A;  bilateral   LAPAROSCOPIC BILATERAL SALPINGECTOMY N/A 02/13/2023   Procedure: LAPAROSCOPIC BILATERAL SALPINGECTOMY;  Surgeon: Erik Kieth BROCKS, MD;  Location: [facility];  Service: Gynecology;  Laterality: N/A;   RECTAL EXAM UNDER ANESTHESIA N/A 10/10/2023   Procedure: EXAM UNDER ANESTHESIA, RECTUM;  Surgeon: Polly Cordella DELENA, MD;  Location: [facility] OR;  Service: General;  Laterality: N/A;    Family History  Problem Relation Age of Onset   Thyroid disease Mother    Cancer Maternal Grandmother    Cancer Paternal Grandfather    Hashimoto's thyroiditis Sister    Thyroid disease Sister    Thyroid disease Brother     Social History   Socioeconomic History   Marital status: Divorced    Spouse name: Not on file   Number of children: 0   Years of education: Not on file   Highest education level: Not on file  Occupational History   Occupation: Child psychotherapist  Tobacco Use   Smoking status: Some Days    Types: Cigarettes   Smokeless tobacco: Never   Tobacco comments:    Very rarely, social smoker  Vaping Use   Vaping status: Some  Days  Substance and Sexual Activity   Alcohol use: Yes    Comment: Rarely   Drug use: Not Currently    Types: Marijuana   Sexual activity: Yes    Birth control/protection: I.U.D., Surgical  Other Topics Concern   Not on file  Social History Narrative   Not on file   Social Drivers of Health   Financial Resource Strain: Not on file  Food Insecurity: No Food Insecurity (10/10/2023)   Hunger Vital Sign    Worried  About Running Out of Food in the Last Year: Never true    Ran Out of Food in the Last Year: Never true  Transportation Needs: No Transportation Needs (10/10/2023)   PRAPARE - Administrator, Civil Service (Medical): No    Lack of Transportation (Non-Medical): No  Physical Activity: Not on file  Stress: Not on file  Social Connections: Not on file  Intimate Partner Violence: Not At Risk (10/10/2023)   Humiliation, Afraid, Rape, and Kick questionnaire    Fear of Current or Ex-Partner: No    Emotionally Abused: No    Physically Abused: No    Sexually Abused: No     Current Outpatient Medications:    acetaminophen  (TYLENOL ) 500 MG tablet, Take 1,000 mg by mouth as needed., Disp: , Rfl:    amoxicillin -clavulanate (AUGMENTIN ) 875-125 MG tablet, Take 1 tablet by mouth 2 (two) times daily., Disp: 60 tablet, Rfl: 0   levonorgestrel (LILETTA, 52 MG,) 20.1 MCG/DAY IUD IUD, 1 each by Intrauterine route once., Disp: , Rfl:    Misc Natural Products (TURMERIC, CURCUMIN, PO), Take 1 tablet by mouth daily., Disp: , Rfl:    Omega-3 Fatty Acids (FISH OIL PO), Take 2 tablets by mouth daily., Disp: , Rfl:    ondansetron  (ZOFRAN -ODT) 8 MG disintegrating tablet, Take 1 tablet (8 mg total) by mouth every 8 (eight) hours as needed for nausea or vomiting., Disp: 20 tablet, Rfl: 1   pantoprazole  (PROTONIX ) 40 MG tablet, Take 1 tablet (40 mg total) by mouth daily. Take 20-30 minutes before meal., Disp: 90 tablet, Rfl: 1   Sodium Sulfate-Mag Sulfate-KCl (SUTAB ) 1479-225-188 MG TABS, Use as directed for colonoscopy., Disp: 24 tablet, Rfl: 0   Sofpironium  Bromide (SOFDRA ) 12.45 % GEL, Apply 1 Application topically at bedtime. Apply to both feet and between toes., Disp: 40.2 mL, Rfl: 4   traZODone  (DESYREL ) 50 MG tablet, Take 50 mg by mouth at bedtime., Disp: , Rfl:   Allergies  Allergen Reactions   Latex Rash   Sulfamethoxazole-Trimethoprim Rash and Swelling    Review of Systems - Negative except as  stated above    Medications:   Current Outpatient Medications on File Prior to Visit  Medication Sig Dispense Refill   amoxicillin -clavulanate (AUGMENTIN ) 875-125 mg tablet Take 1 tablet by mouth 2 (two) times daily     omega 3-dha-epa-fish oil (FISH OIL) 1,000 (120-180) mg Cap Take by mouth     pantoprazole  (PROTONIX ) 40 MG DR tablet Take 40 mg by mouth once daily     SOFDRA  12.45 % (72 mg /actuation) GlwP Apply 1 Application topically     No current facility-administered medications on file prior to visit.    Physical Examination:     General: Well-developed, well-nourished, in no acute distress.   Rectal: Small I&D sites on left and right posterior perianal regions are well-healed without erythema or drainage but she does have significant edema, especially on the left side.   No tenderness to palpation  A chaperone, Burnard Crete, LPN, was present during the exam.    Assessment and Plan:   Anal fistula, complex   Debbie [name] is status post incision and drainage of bilateral abscesses on 10/10/2023 by Dr. Polly.  No signs of ongoing infection on today's exam.  Amount of edema present and the fact that her wounds are not completely healed is concerning for ongoing fistula and possible Crohn's disease, even though biopsies have been negative.  I have recommend  EUA and seton placement. We would like to do this in the next couple weeks.    Bernarda JAYSON Ned, MD Colon and Rectal Surgery [facility]

## 2023-12-06 ENCOUNTER — Encounter: Payer: Self-pay | Admitting: Pediatrics

## 2023-12-06 ENCOUNTER — Encounter (HOSPITAL_BASED_OUTPATIENT_CLINIC_OR_DEPARTMENT_OTHER): Payer: Self-pay | Admitting: General Surgery

## 2023-12-07 ENCOUNTER — Other Ambulatory Visit: Payer: Self-pay | Admitting: Pediatrics

## 2023-12-08 ENCOUNTER — Other Ambulatory Visit: Payer: Self-pay | Admitting: Pediatrics

## 2023-12-08 ENCOUNTER — Telehealth (HOSPITAL_COMMUNITY): Payer: Self-pay

## 2023-12-08 DIAGNOSIS — K50919 Crohn's disease, unspecified, with unspecified complications: Secondary | ICD-10-CM | POA: Insufficient documentation

## 2023-12-08 DIAGNOSIS — K5090 Crohn's disease, unspecified, without complications: Secondary | ICD-10-CM | POA: Insufficient documentation

## 2023-12-08 DIAGNOSIS — K50913 Crohn's disease, unspecified, with fistula: Secondary | ICD-10-CM

## 2023-12-08 NOTE — Telephone Encounter (Signed)
 Auth Submission: PENDING Site of care: Site of care: [facility] Payer: [employer] Employee Medication & CPT/J Code(s) submitted: Avsola (infliximab-axxq) [id number] Diagnosis Code: K50.90 Route of submission (phone, fax, portal): fax Phone # Fax #[phone] Auth type: Buy/Bill HB Units/visits requested: 10mg /kg Reference number:  Approval from:  to

## 2023-12-12 ENCOUNTER — Encounter (HOSPITAL_BASED_OUTPATIENT_CLINIC_OR_DEPARTMENT_OTHER)
Admission: RE | Disposition: A | Discharge status: Home / Self Care | Payer: Self-pay | Source: Home / Self Care | Attending: General Surgery

## 2023-12-12 ENCOUNTER — Encounter (HOSPITAL_BASED_OUTPATIENT_CLINIC_OR_DEPARTMENT_OTHER): Payer: Self-pay

## 2023-12-12 ENCOUNTER — Other Ambulatory Visit (HOSPITAL_COMMUNITY): Payer: Self-pay

## 2023-12-12 ENCOUNTER — Other Ambulatory Visit: Payer: Self-pay | Admitting: Pediatrics

## 2023-12-12 ENCOUNTER — Other Ambulatory Visit: Payer: Self-pay

## 2023-12-12 ENCOUNTER — Ambulatory Visit (HOSPITAL_COMMUNITY)
Admission: RE | Admit: 2023-12-12 | Discharge: 2023-12-12 | Disposition: A | Discharge status: Home / Self Care | Attending: General Surgery | Admitting: General Surgery

## 2023-12-12 ENCOUNTER — Ambulatory Visit (HOSPITAL_BASED_OUTPATIENT_CLINIC_OR_DEPARTMENT_OTHER): Payer: Self-pay

## 2023-12-12 ENCOUNTER — Encounter (HOSPITAL_BASED_OUTPATIENT_CLINIC_OR_DEPARTMENT_OTHER): Payer: Self-pay | Admitting: General Surgery

## 2023-12-12 DIAGNOSIS — F418 Other specified anxiety disorders: Secondary | ICD-10-CM

## 2023-12-12 DIAGNOSIS — F17210 Nicotine dependence, cigarettes, uncomplicated: Secondary | ICD-10-CM | POA: Insufficient documentation

## 2023-12-12 DIAGNOSIS — F419 Anxiety disorder, unspecified: Secondary | ICD-10-CM | POA: Diagnosis not present

## 2023-12-12 DIAGNOSIS — K60321 Anal fistula, complex, initial: Secondary | ICD-10-CM | POA: Diagnosis not present

## 2023-12-12 DIAGNOSIS — K611 Rectal abscess: Secondary | ICD-10-CM

## 2023-12-12 DIAGNOSIS — F32A Depression, unspecified: Secondary | ICD-10-CM | POA: Insufficient documentation

## 2023-12-12 DIAGNOSIS — K60329 Anal fistula, complex, unspecified: Secondary | ICD-10-CM | POA: Diagnosis not present

## 2023-12-12 DIAGNOSIS — M3500 Sicca syndrome, unspecified: Secondary | ICD-10-CM | POA: Insufficient documentation

## 2023-12-12 HISTORY — PX: PLACEMENT OF SETON: SHX6029

## 2023-12-12 HISTORY — PX: RECTAL EXAM UNDER ANESTHESIA: SHX6399

## 2023-12-12 LAB — POCT PREGNANCY, URINE: Preg Test, Ur: NEGATIVE

## 2023-12-12 SURGERY — EXAM UNDER ANESTHESIA, RECTUM
Anesthesia: Monitor Anesthesia Care | Site: Rectum

## 2023-12-12 MED ORDER — OXYCODONE HCL 5 MG/5ML PO SOLN: 5.0000 mg | Freq: Once | ORAL | Status: DC | PRN

## 2023-12-12 MED ORDER — MIDAZOLAM HCL 5 MG/5ML IJ SOLN
INTRAMUSCULAR | Status: DC | PRN
  Administered 2023-12-12: 2 mg via INTRAVENOUS

## 2023-12-12 MED ORDER — LIDOCAINE 2% (20 MG/ML) 5 ML SYRINGE
INTRAMUSCULAR | Status: DC | PRN
  Administered 2023-12-12: 50 mg via INTRAVENOUS

## 2023-12-12 MED ORDER — HYDROCODONE-ACETAMINOPHEN 5-325 MG PO TABS
1.0000 | ORAL_TABLET | ORAL | 0 refills | Status: AC | PRN
  Filled 2023-12-12: qty 20, 4d supply, fill #0

## 2023-12-12 MED ORDER — BUPIVACAINE-EPINEPHRINE (PF) 0.5% -1:200000 IJ SOLN
INTRAMUSCULAR | Status: AC
  Filled 2023-12-12: qty 30

## 2023-12-12 MED ORDER — AMISULPRIDE (ANTIEMETIC) 5 MG/2ML IV SOLN: 10.0000 mg | Freq: Once | INTRAVENOUS | Status: DC | PRN

## 2023-12-12 MED ORDER — PROPOFOL 10 MG/ML IV BOLUS
INTRAVENOUS | Status: DC | PRN
Start: 2023-12-12 — End: 2023-12-12
  Administered 2023-12-12: 20 mg via INTRAVENOUS
  Administered 2023-12-12: 30 mg via INTRAVENOUS

## 2023-12-12 MED ORDER — PROPOFOL 500 MG/50ML IV EMUL
INTRAVENOUS | Status: DC | PRN
  Administered 2023-12-12: 200 ug/kg/min via INTRAVENOUS

## 2023-12-12 MED ORDER — SODIUM CHLORIDE 0.9 % IV SOLN: INTRAVENOUS | Status: DC | PRN

## 2023-12-12 MED ORDER — ONDANSETRON HCL 4 MG/2ML IJ SOLN
INTRAMUSCULAR | Status: DC | PRN
  Administered 2023-12-12: 4 mg via INTRAVENOUS

## 2023-12-12 MED ORDER — ACETAMINOPHEN 500 MG PO TABS
1000.0000 mg | ORAL_TABLET | ORAL | Status: AC
  Administered 2023-12-12: 1000 mg via ORAL

## 2023-12-12 MED ORDER — LACTATED RINGERS IV SOLN: INTRAVENOUS | Status: DC

## 2023-12-12 MED ORDER — MIDAZOLAM HCL 2 MG/2ML IJ SOLN
INTRAMUSCULAR | Status: AC
  Filled 2023-12-12: qty 2

## 2023-12-12 MED ORDER — FENTANYL CITRATE (PF) 100 MCG/2ML IJ SOLN
INTRAMUSCULAR | Status: DC | PRN
  Administered 2023-12-12: 25 ug via INTRAVENOUS

## 2023-12-12 MED ORDER — OXYCODONE HCL 5 MG PO TABS: 5.0000 mg | ORAL_TABLET | Freq: Once | ORAL | Status: DC | PRN

## 2023-12-12 MED ORDER — ACETAMINOPHEN 500 MG PO TABS
ORAL_TABLET | ORAL | Status: AC
  Filled 2023-12-12: qty 2

## 2023-12-12 MED ORDER — BUPIVACAINE-EPINEPHRINE 0.5% -1:200000 IJ SOLN
INTRAMUSCULAR | Status: DC | PRN
  Administered 2023-12-12: 30 mL

## 2023-12-12 MED ORDER — SODIUM CHLORIDE 0.9% FLUSH
3.0000 mL | Freq: Two times a day (BID) | INTRAVENOUS | Status: DC
Start: 2023-12-12 — End: 2023-12-12

## 2023-12-12 MED ORDER — KETAMINE HCL 10 MG/ML IJ SOLN
INTRAMUSCULAR | Status: DC | PRN
  Administered 2023-12-12: 5 mg via INTRAVENOUS
  Administered 2023-12-12 (×3): 10 mg via INTRAVENOUS

## 2023-12-12 MED ORDER — PROPOFOL 500 MG/50ML IV EMUL
INTRAVENOUS | Status: AC
  Filled 2023-12-12: qty 50

## 2023-12-12 MED ORDER — FENTANYL CITRATE (PF) 100 MCG/2ML IJ SOLN: 25.0000 ug | INTRAMUSCULAR | Status: DC | PRN

## 2023-12-12 MED ORDER — FENTANYL CITRATE (PF) 100 MCG/2ML IJ SOLN
INTRAMUSCULAR | Status: AC
  Filled 2023-12-12: qty 2

## 2023-12-12 MED ORDER — KETAMINE HCL 50 MG/5ML IJ SOSY
PREFILLED_SYRINGE | INTRAMUSCULAR | Status: AC
Start: 2023-12-12 — End: 2023-12-12
  Filled 2023-12-12: qty 5

## 2023-12-12 SURGICAL SUPPLY — 45 items
BENZOIN TINCTURE PRP APPL 2/3 (GAUZE/BANDAGES/DRESSINGS) ×2 IMPLANT
BLADE EXTENDED COATED 6.5IN (ELECTRODE) IMPLANT
BLADE SURG 10 STRL SS (BLADE) IMPLANT
BRIEF MESH DISP 2XL (UNDERPADS AND DIAPERS) ×1 IMPLANT
COVER BACK TABLE 60X90IN (DRAPES) ×1 IMPLANT
COVER MAYO STAND STRL (DRAPES) ×1 IMPLANT
DRAPE HYSTEROSCOPY (MISCELLANEOUS) IMPLANT
DRAPE LAPAROTOMY 100X72 PEDS (DRAPES) ×1 IMPLANT
DRAPE UTILITY XL STRL (DRAPES) ×1 IMPLANT
ELECTRODE REM PT RTRN 9FT ADLT (ELECTROSURGICAL) ×1 IMPLANT
GAUZE 4X4 16PLY ~~LOC~~+RFID DBL (SPONGE) ×1 IMPLANT
GAUZE PAD ABD 8X10 STRL (GAUZE/BANDAGES/DRESSINGS) ×1 IMPLANT
GAUZE SPONGE 4X4 12PLY STRL (GAUZE/BANDAGES/DRESSINGS) IMPLANT
GLOVE BIO SURGEON STRL SZ 6.5 (GLOVE) ×1 IMPLANT
GLOVE INDICATOR 6.5 STRL GRN (GLOVE) ×1 IMPLANT
GOWN STRL REUS W/TWL XL LVL3 (GOWN DISPOSABLE) ×1 IMPLANT
HYDROGEN PEROXIDE 16OZ (MISCELLANEOUS) IMPLANT
IV CATH 14GX2 1/4 (CATHETERS) IMPLANT
IV CATH 18G SAFETY (IV SOLUTION) IMPLANT
KIT SIGMOIDOSCOPE (SET/KITS/TRAYS/PACK) IMPLANT
KIT TURNOVER KIT B (KITS) ×1 IMPLANT
LEGGING LITHOTOMY PAIR STRL (DRAPES) IMPLANT
LOOP VASCLR MAXI BLUE 18IN ST (MISCELLANEOUS) IMPLANT
NDL HYPO 22X1.5 SAFETY MO (MISCELLANEOUS) ×1 IMPLANT
NEEDLE HYPO 22X1.5 SAFETY MO (MISCELLANEOUS) ×1 IMPLANT
NS IRRIG 1000ML POUR BTL (IV SOLUTION) ×1 IMPLANT
PACK BASIN DAY SURGERY FS (CUSTOM PROCEDURE TRAY) ×1 IMPLANT
PAD ARMBOARD POSITIONER FOAM (MISCELLANEOUS) IMPLANT
PENCIL SMOKE EVACUATOR (MISCELLANEOUS) ×1 IMPLANT
SHEET MEDIUM DRAPE 40X70 STRL (DRAPES) IMPLANT
SLEEVE SCD COMPRESS KNEE MED (STOCKING) ×1 IMPLANT
SPIKE FLUID TRANSFER (MISCELLANEOUS) ×1 IMPLANT
SPONGE SURGIFOAM ABS GEL 12-7 (HEMOSTASIS) IMPLANT
SUCTION TUBE FRAZIER 10FR DISP (SUCTIONS) IMPLANT
SUT CHROMIC 2 0 SH (SUTURE) IMPLANT
SUT CHROMIC 3 0 SH 27 (SUTURE) IMPLANT
SUT ETHIBOND 0 (SUTURE) IMPLANT
SUT VIC AB 2-0 SH 27XBRD (SUTURE) IMPLANT
SUT VIC AB 3-0 SH 18 (SUTURE) IMPLANT
SUT VIC AB 3-0 SH 27XBRD (SUTURE) IMPLANT
SYR CONTROL 10ML LL (SYRINGE) ×1 IMPLANT
TOWEL GREEN STERILE FF (TOWEL DISPOSABLE) ×1 IMPLANT
TRAY DSU PREP LF (CUSTOM PROCEDURE TRAY) ×1 IMPLANT
TUBE CONNECTING 20X1/4 (TUBING) ×1 IMPLANT
YANKAUER SUCT BULB TIP NO VENT (SUCTIONS) ×1 IMPLANT

## 2023-12-12 NOTE — Anesthesia Postprocedure Evaluation (Signed)
 Anesthesia Post Note  Patient: Debbie [name]  Procedure(s) Performed: EXAM UNDER ANESTHESIA, RECTUM (Rectum) PLACEMENT, SETON (Rectum)     Patient location during evaluation: PACU Anesthesia Type: MAC Level of consciousness: awake Pain management: pain level controlled Vital Signs Assessment: post-procedure vital signs reviewed and stable Respiratory status: spontaneous breathing, nonlabored ventilation and respiratory function stable Cardiovascular status: stable and blood pressure returned to baseline Postop Assessment: no apparent nausea or vomiting Anesthetic complications: no   No notable events documented.  Last Vitals:  Vitals:   12/12/23 1109 12/12/23 1115  BP: 123/85 95/71  Pulse: 85 77  Resp: 17 (!) 23  Temp: (!) 36.4 C   SpO2: 100% 100%    Last Pain:  Vitals:   12/12/23 1115  PainSc: 0-No pain                 Delon Aisha Arch

## 2023-12-12 NOTE — Discharge Instructions (Addendum)
 Beginning the day after surgery:  You may sit in a tub of warm water 2-3 times a day to relieve discomfort.  Eat a regular diet high in fiber.  Avoid foods that give you constipation or diarrhea.  Avoid foods that are difficult to digest, such as seeds, nuts, corn or popcorn.  Do not go any longer than 2 days without a bowel movement.  You may take a dose of Milk of Magnesia if you become constipated.    Drink 6-8 glasses of water daily.  Walking is encouraged.  Avoid strenuous activity and heavy lifting for one month after surgery.    Call the office if you have any questions or concerns.  Call immediately if you develop:  Excessive rectal bleeding (more than a cup or passing large clots) Increased discomfort Fever greater than 100 F Difficulty urinating     No Tylenol  before 3pm today.   Post Anesthesia Home Care Instructions  Activity: Get plenty of rest for the remainder of the day. A responsible individual must stay with you for 24 hours following the procedure.  For the next 24 hours, DO NOT: -Drive a car -Advertising copywriter -Drink alcoholic beverages -Take any medication unless instructed by your physician -Make any legal decisions or sign important papers.  Meals: Start with liquid foods such as gelatin or soup. Progress to regular foods as tolerated. Avoid greasy, spicy, heavy foods. If nausea and/or vomiting occur, drink only clear liquids until the nausea and/or vomiting subsides. Call your physician if vomiting continues.  Special Instructions/Symptoms: Your throat may feel dry or sore from the anesthesia or the breathing tube placed in your throat during surgery. If this causes discomfort, gargle with warm salt water. The discomfort should disappear within 24 hours.  If you had a scopolamine patch placed behind your ear for the management of post- operative nausea and/or vomiting:  1. The medication in the patch is effective for 72 hours, after which it should  be removed.  Wrap patch in a tissue and discard in the trash. Wash hands thoroughly with soap and water. 2. You may remove the patch earlier than 72 hours if you experience unpleasant side effects which may include dry mouth, dizziness or visual disturbances. 3. Avoid touching the patch. Wash your hands with soap and water after contact with the patch.

## 2023-12-12 NOTE — Anesthesia Preprocedure Evaluation (Addendum)
 Anesthesia Evaluation  Patient identified by MRN, date of birth, ID band Patient awake    Reviewed: Allergy & Precautions, NPO status , Patient's Chart, lab work & pertinent test results  History of Anesthesia Complications Negative for: history of anesthetic complications  Airway Mallampati: I  TM Distance: >3 FB Neck ROM: Full   Comment: Previous grade I view with MAC 3, easy mask Dental  (+) Dental Advisory Given   Pulmonary neg shortness of breath, neg sleep apnea, neg COPD, neg recent URI, Current Smoker, former smoker   Pulmonary exam normal breath sounds clear to auscultation       Cardiovascular negative cardio ROS  Rhythm:Regular Rate:Normal     Neuro/Psych  PSYCHIATRIC DISORDERS Anxiety Depression    negative neurological ROS     GI/Hepatic Neg liver ROS,neg GERD  ,,Crohn's disease   Endo/Other  negative endocrine ROS    Renal/GU negative Renal ROS     Musculoskeletal   Abdominal   Peds  Hematology negative hematology ROS (+) Lab Results      Component                Value               Date                      WBC                      4.9                 10/19/2023                HGB                      13.2                10/19/2023                HCT                      40.1                10/19/2023                MCV                      86.4                10/19/2023                PLT                      323.0               10/19/2023              Anesthesia Other Findings Lupus, Sjogren's syndrome  Reproductive/Obstetrics                              Anesthesia Physical Anesthesia Plan  ASA: 2  Anesthesia Plan: MAC   Post-op Pain Management: Minimal or no pain anticipated, Tylenol  PO (pre-op)* and Precedex    Induction: Intravenous  PONV Risk Score and Plan: 1 and Ondansetron , Dexamethasone , Propofol  infusion, TIVA and Midazolam   Airway Management  Planned: Natural Airway and Simple Face Mask  Additional Equipment:  Intra-op Plan:   Post-operative Plan:   Informed Consent: I have reviewed the patients History and Physical, chart, labs and discussed the procedure including the risks, benefits and alternatives for the proposed anesthesia with the patient or authorized representative who has indicated his/her understanding and acceptance.     Dental advisory given  Plan Discussed with: CRNA and Anesthesiologist  Anesthesia Plan Comments: (Discussed with patient risks of MAC including, but not limited to, minor pain or discomfort, hearing people in the room, and possible need for backup general anesthesia. Risks for general anesthesia also discussed including, but not limited to, sore throat, hoarse voice, chipped/damaged teeth, injury to vocal cords, nausea and vomiting, allergic reactions, lung infection, heart attack, stroke, and death. All questions answered.  )         Anesthesia Quick Evaluation

## 2023-12-12 NOTE — Interval H&P Note (Signed)
 History and Physical Interval Note:  12/12/2023 9:13 AM  Debbie [name]  has presented today for surgery, with the diagnosis of COMPLEX ANAL FISTULA.  The various methods of treatment have been discussed with the patient and family. After consideration of risks, benefits and other options for treatment, the patient has consented to  Procedure(s): EXAM UNDER ANESTHESIA, RECTUM (N/A) PLACEMENT, SETON (N/A) as a surgical intervention.  The patient's history has been reviewed, patient examined, no change in status, stable for surgery.  I have reviewed the patient's chart and labs.  Questions were answered to the patient's satisfaction.     Bernarda JAYSON Ned, MD  Colorectal and General Surgery [facility]

## 2023-12-12 NOTE — Op Note (Signed)
 12/12/2023  11:04 AM  PATIENT:  Debbie [name]  33 y.o. female  Patient Care Team: Allwardt, Mardy HERO, PA-C as PCP - General (Physician Assistant) Sheffield, Andrez SAUNDERS, PA-C (Inactive) as Physician Assistant (Dermatology)  PRE-OPERATIVE DIAGNOSIS:  COMPLEX ANAL FISTULA  POST-OPERATIVE DIAGNOSIS:  COMPLEX ANAL FISTULA  PROCEDURE:  Procedure(s): EXAM UNDER ANESTHESIA, RECTUM PLACEMENT, SETON   Surgeon(s): [name], MD  ASSISTANT: none   ANESTHESIA:   local and MAC  SPECIMEN:  No Specimen  DISPOSITION OF SPECIMEN:  N/A  COUNTS:  YES  PLAN OF CARE: Discharge to home after PACU  PATIENT DISPOSITION:  PACU - hemodynamically stable.  INDICATION: 33 y.o. F with bilateral anterior anal fistula.  There is concern for IBD.  We have decided to place setons for now.   OR FINDINGS: bilateral anterior trans-sphincteric anal fistula arising from distal anterior internal opening involving very little internal anal sphincter and all of the external anal sphincter.  No signs of active inflammation in the distal rectum or anal canal.   DESCRIPTION: the patient was identified in the preoperative holding area and taken to the OR where they were laid on the operating room table.  MAC anesthesia was induced without difficulty. The patient was then positioned in prone jackknife position with buttocks gently taped apart.  The patient was then prepped and draped in usual sterile fashion.  SCDs were noted to be in place prior to the initiation of anesthesia. A surgical timeout was performed indicating the correct patient, procedure, positioning and need for preoperative antibiotics.  A rectal block was performed using Marcaine  with epinephrine .    I began with a digital rectal exam.  The anal canal was gently dilated.  I then placed a Hill-Ferguson anoscope into the anal canal and evaluated this completely.  There was no anal canal erythema or edema.  The distal rectum appeared normal.  There  was a internal opening in the distal anterior midline.  This was approximately 7 to 8 mm in diameter and was situated over the intersphincteric groove involving very little to no internal anal sphincter.  It did go deep and underneath the entire external sphincter bilaterally.  Fistula probes were used to confirm this.  I decided to place a seton in both fistula tracts.  An Ethibond suture was pulled through the tract and a vessel loop was pulled back through on either side and secured with 3 interrupted Ethibond sutures.  Additional local anesthesia was placed for postoperative pain control.  A dressing was applied.  The patient was then awakened from anesthesia and sent to the postanesthesia care unit in stable condition.  All counts were correct per operating room staff.     Hila JAYSON Debby, MD  Colorectal and General Surgery [facility]

## 2023-12-12 NOTE — Transfer of Care (Signed)
 Immediate Anesthesia Transfer of Care Note  Patient: Debbie [name]  Procedure(s) Performed: EXAM UNDER ANESTHESIA, RECTUM (Rectum) PLACEMENT, SETON (Rectum)  Patient Location: PACU  Anesthesia Type:MAC  Level of Consciousness: awake  Airway & Oxygen Therapy: Patient Spontanous Breathing  Post-op Assessment: Report given to RN and Post -op Vital signs reviewed and stable  Post vital signs: Reviewed and stable  Last Vitals:  Vitals Value Taken Time  BP 123/85 12/12/23 11:09  Temp    Pulse 82 12/12/23 11:13  Resp 15 12/12/23 11:13  SpO2 99 % 12/12/23 11:13  Vitals shown include unfiled device data.  Last Pain:  Vitals:   12/12/23 0911  PainSc: 0-No pain         Complications: No notable events documented.

## 2023-12-13 ENCOUNTER — Encounter (HOSPITAL_BASED_OUTPATIENT_CLINIC_OR_DEPARTMENT_OTHER): Payer: Self-pay | Admitting: General Surgery

## 2023-12-13 ENCOUNTER — Other Ambulatory Visit (HOSPITAL_COMMUNITY): Payer: Self-pay

## 2023-12-13 MED ORDER — AMOXICILLIN-POT CLAVULANATE 875-125 MG PO TABS
1.0000 | ORAL_TABLET | Freq: Two times a day (BID) | ORAL | 2 refills | Status: AC
  Filled 2023-12-13: qty 60, 30d supply, fill #0

## 2023-12-14 ENCOUNTER — Other Ambulatory Visit: Payer: Self-pay | Admitting: Pediatrics

## 2023-12-15 ENCOUNTER — Other Ambulatory Visit (HOSPITAL_COMMUNITY): Payer: Self-pay

## 2023-12-15 ENCOUNTER — Ambulatory Visit (INDEPENDENT_AMBULATORY_CARE_PROVIDER_SITE_OTHER): Admitting: Nurse Practitioner

## 2023-12-15 ENCOUNTER — Encounter: Payer: Self-pay | Admitting: Nurse Practitioner

## 2023-12-15 VITALS — BP 101/73 | HR 74 | Wt 139.0 lb

## 2023-12-15 DIAGNOSIS — Z13220 Encounter for screening for lipoid disorders: Secondary | ICD-10-CM

## 2023-12-15 DIAGNOSIS — R1010 Upper abdominal pain, unspecified: Secondary | ICD-10-CM | POA: Diagnosis not present

## 2023-12-15 DIAGNOSIS — L74512 Primary focal hyperhidrosis, palms: Secondary | ICD-10-CM | POA: Diagnosis not present

## 2023-12-15 DIAGNOSIS — L74513 Primary focal hyperhidrosis, soles: Secondary | ICD-10-CM | POA: Diagnosis not present

## 2023-12-15 DIAGNOSIS — G4700 Insomnia, unspecified: Secondary | ICD-10-CM | POA: Diagnosis not present

## 2023-12-15 DIAGNOSIS — Z0000 Encounter for general adult medical examination without abnormal findings: Secondary | ICD-10-CM | POA: Diagnosis not present

## 2023-12-15 DIAGNOSIS — K50918 Crohn's disease, unspecified, with other complication: Secondary | ICD-10-CM

## 2023-12-15 MED ORDER — TRAZODONE HCL 50 MG PO TABS
50.0000 mg | ORAL_TABLET | Freq: Every evening | ORAL | 1 refills | Status: AC | PRN
  Filled 2023-12-15: qty 60, 60d supply, fill #0
  Filled 2024-02-19: qty 60, 60d supply, fill #1

## 2023-12-15 NOTE — Progress Notes (Signed)
 New Patient Office Visit  Subjective:  Patient ID: Debbie [name], female    DOB: 07-31-1990  Age: 33 y.o. MRN: [id number]  CC:  Chief Complaint  Patient presents with   Establish Care    HPI    Discussed the use of AI scribe software for clinical note transcription with the patient, who gave verbal consent to proceed.  History of Present Illness Debbie [name] is a 33 year old female with perianal Crohn's disease who presents to establish care and for a CPE  She has a history of perianal Crohn's disease and recently underwent a procedure for fistula with setons placement. She is currently on Augmentin  as directed by her GI doctor until she starts biologic infusions.  She experiences silent acid reflux and is prescribed pantoprazole  40 mg daily. However, she struggles with consistent intake due to irregular meal times. She manages her symptoms by avoiding spicy foods but continues to consume caffeine.  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 She applies it nightly and finds it effective.  Her past medical history includes anxiety, depression, lupus, , and Sjogren's syndrome. She experiences occasional low energy days, which she attributes to her depression, but generally feels healthy.  Socially, she is divorced, lives alone, and consumes alcohol infrequently, about once a month. She does not use drugs, smoke, or vape. She enjoys walking and hiking for physical activity.    Assessment & Plan    Past Medical History:  Diagnosis Date   Anxiety    Depression    Lupus    Sjogren's syndrome     Past Surgical History:  Procedure Laterality Date   ABCESS DRAINAGE     BIOPSY OF SKIN SUBCUTANEOUS TISSUE AND/OR MUCOUS MEMBRANE  10/26/2023   Procedure: BIOPSY, SKIN, SUBCUTANEOUS TISSUE, OR MUCOUS MEMBRANE;  Surgeon: Suzann Inocente HERO, MD;  Location: [facility] ENDOSCOPY;  Service: Gastroenterology;;   COLONOSCOPY N/A  10/26/2023   Procedure: COLONOSCOPY;  Surgeon: Suzann Inocente HERO, MD;  Location: [facility] ENDOSCOPY;  Service: Gastroenterology;  Laterality: N/A;   CYSTOSCOPY     ESOPHAGOGASTRODUODENOSCOPY N/A 10/26/2023   Procedure: EGD (ESOPHAGOGASTRODUODENOSCOPY);  Surgeon: Suzann Inocente HERO, MD;  Location: [facility] ENDOSCOPY;  Service: Gastroenterology;  Laterality: N/A;   INCISION AND DRAINAGE PERIRECTAL ABSCESS N/A 10/10/2023   Procedure: INCISION AND DRAINAGE, ABSCESS, PERIRECTAL;  Surgeon: Polly Cordella DELENA, MD;  Location: [facility] OR;  Service: General;  Laterality: N/A;  bilateral   LAPAROSCOPIC BILATERAL SALPINGECTOMY N/A 02/13/2023   Procedure: LAPAROSCOPIC BILATERAL SALPINGECTOMY;  Surgeon: Erik Kieth BROCKS, MD;  Location: [facility];  Service: Gynecology;  Laterality: N/A;   PLACEMENT OF SETON N/A 12/12/2023   Procedure: PLACEMENT, SETON;  Surgeon: [name], MD;  Location: [facility];  Service: General;  Laterality: N/A;   RECTAL EXAM UNDER ANESTHESIA N/A 10/10/2023   Procedure: EXAM UNDER ANESTHESIA, RECTUM;  Surgeon: Polly Cordella DELENA, MD;  Location: [facility] OR;  Service: General;  Laterality: N/A;   RECTAL EXAM UNDER ANESTHESIA N/A 12/12/2023   Procedure: EXAM UNDER ANESTHESIA, RECTUM;  Surgeon: [name], MD;  Location: [facility];  Service: General;  Laterality: N/A;    Family History  Problem Relation Age of Onset   Thyroid disease Mother    Hashimoto's thyroiditis Sister    Thyroid disease Sister    Thyroid disease Brother    Cancer Maternal Grandmother    Throat cancer Maternal Grandmother    Cancer Paternal Grandfather  Cancer - Prostate Paternal Grandfather     Social History   Socioeconomic History   Marital status: Divorced    Spouse name: Not on file   Number of children: 0   Years of education: Not on file   Highest education level: Not on file  Occupational History   Occupation: Child psychotherapist  Tobacco Use   Smoking status:  Former    Current packs/day: 0.00    Types: Cigarettes    Quit date: 08/21/2023    Years since quitting: 0.3   Smokeless tobacco: Never   Tobacco comments:    Very rarely, social smoker  Vaping Use   Vaping status: Some Days  Substance and Sexual Activity   Alcohol use: Yes    Comment: Rarely   Drug use: Not Currently    Types: Marijuana   Sexual activity: Yes    Birth control/protection: I.U.D.  Other Topics Concern   Not on file  Social History Narrative   Lives home alone    Social Drivers of Health   Financial Resource Strain: Not on file  Food Insecurity: No Food Insecurity (10/10/2023)   Hunger Vital Sign    Worried About Running Out of Food in the Last Year: Never true    Ran Out of Food in the Last Year: Never true  Transportation Needs: No Transportation Needs (10/10/2023)   PRAPARE - Administrator, Civil Service (Medical): No    Lack of Transportation (Non-Medical): No  Physical Activity: Not on file  Stress: Not on file  Social Connections: Not on file  Intimate Partner Violence: Not At Risk (10/10/2023)   Humiliation, Afraid, Rape, and Kick questionnaire    Fear of Current or Ex-Partner: No    Emotionally Abused: No    Physically Abused: No    Sexually Abused: No    ROS Review of Systems  Constitutional:  Negative for appetite change, chills, fatigue and fever.  HENT:  Negative for congestion, postnasal drip, rhinorrhea and sneezing.   Eyes:  Negative for pain, discharge and itching.  Respiratory:  Negative for cough, shortness of breath and wheezing.   Cardiovascular:  Negative for chest pain, palpitations and leg swelling.  Gastrointestinal:  Negative for abdominal pain, constipation, nausea and vomiting.  Endocrine: Negative for cold intolerance, heat intolerance and polydipsia.  Genitourinary:  Negative for difficulty urinating, dysuria, flank pain and frequency.  Musculoskeletal:  Negative for arthralgias, back pain, joint swelling and  myalgias.  Skin:  Negative for color change, pallor, rash and wound.  Neurological:  Negative for dizziness, facial asymmetry, weakness, numbness and headaches.  Psychiatric/Behavioral:  Negative for behavioral problems, confusion, self-injury and suicidal ideas.     Objective:   Today's Vitals: BP 101/73   Pulse 74   Wt 139 lb (63 kg)   SpO2 99%   BMI 22.44 kg/m   Physical Exam Vitals and nursing note reviewed.  Constitutional:      General: She is not in acute distress.    Appearance: Normal appearance. She is not ill-appearing, toxic-appearing or diaphoretic.  HENT:     Right Ear: Tympanic membrane, ear canal and external ear normal. There is no impacted cerumen.     Left Ear: Tympanic membrane, ear canal and external ear normal. There is no impacted cerumen.     Nose: Nose normal. No congestion or rhinorrhea.     Mouth/Throat:     Mouth: Mucous membranes are moist.     Pharynx: Oropharynx is clear. No oropharyngeal exudate  or posterior oropharyngeal erythema.  Eyes:     General: No scleral icterus.       Right eye: No discharge.        Left eye: No discharge.     Extraocular Movements: Extraocular movements intact.     Conjunctiva/sclera: Conjunctivae normal.  Neck:     Vascular: No carotid bruit.  Cardiovascular:     Rate and Rhythm: Normal rate and regular rhythm.     Pulses: Normal pulses.     Heart sounds: Normal heart sounds. No murmur heard.    No friction rub. No gallop.  Pulmonary:     Effort: Pulmonary effort is normal. No respiratory distress.     Breath sounds: Normal breath sounds. No stridor. No wheezing, rhonchi or rales.  Chest:     Chest wall: No tenderness.  Abdominal:     General: Bowel sounds are normal. There is no distension.     Palpations: Abdomen is soft. There is no mass.     Tenderness: There is no abdominal tenderness. There is no right CVA tenderness, left CVA tenderness, guarding or rebound.     Hernia: No hernia is present.   Musculoskeletal:        General: No swelling, tenderness, deformity or signs of injury.     Cervical back: Normal range of motion and neck supple. No rigidity or tenderness.     Right lower leg: No edema.     Left lower leg: No edema.  Lymphadenopathy:     Cervical: No cervical adenopathy.  Skin:    General: Skin is warm and dry.     Capillary Refill: Capillary refill takes less than 2 seconds.     Coloration: Skin is not jaundiced or pale.     Findings: No bruising, erythema, lesion or rash.  Neurological:     Mental Status: She is alert and oriented to person, place, and time.     Cranial Nerves: No cranial nerve deficit.     Sensory: No sensory deficit.     Motor: No weakness.     Coordination: Coordination normal.     Gait: Gait normal.     Deep Tendon Reflexes: Reflexes normal.  Psychiatric:        Mood and Affect: Mood normal.        Behavior: Behavior normal.        Thought Content: Thought content normal.        Judgment: Judgment normal.     Assessment & Plan:   Problem List Items Addressed This Visit       Digestive   Crohn's disease (HCC)   Crohn's disease with perianal fistula Crohn's disease with perianal fistula, recent placement of setons, awaiting initiation of biologic therapy, currently on amoxicillin  to prevent infection. - Continue amoxicillin  until biologic therapy is initiated. - Plan for biologic therapy initiation. - Schedule shingles vaccine prior to biologic therapy.        Musculoskeletal and Integument   Hyperhidrosis of palms and soles   Primary focal hyperhidrosis affecting hands, feet, and armpits with improvement using sofpironium  gel nightly. - Continue sulfapyrinium gel nightly.        Other   Pain of upper abdomen   , described as silent reflux. Currently prescribed pantoprazole  40 mg daily but struggles with adherence due to irregular meal times. Symptoms are not severe, allowing for as-needed use of medication. - Use  pantoprazole  as needed based on symptom control. - Advise dietary modifications to avoid triggers such  as spicy foods and caffeine.       Annual physical exam - Primary    Annual exam as documented.  Counseling done include healthy lifestyle involving committing to 150 minutes of exercise per week, heart healthy diet, The importance of adequate sleep also discussed.  Regular use of seat belt  also discussed . Immunization and cancer screening  needs are specifically addressed at this visit.            Insomnia   Trazodone  50 mg at bedtime as needed refilled      Relevant Medications   traZODone  (DESYREL ) 50 MG tablet   Other Visit Diagnoses       Screening for lipid disorders       Relevant Orders   Lipid panel       Outpatient Encounter Medications as of 12/15/2023  Medication Sig   acetaminophen  (TYLENOL ) 500 MG tablet Take 1,000 mg by mouth as needed.   amoxicillin -clavulanate (AUGMENTIN ) 875-125 MG tablet Take 1 tablet by mouth 2 (two) times daily.   levonorgestrel (LILETTA, 52 MG,) 20.1 MCG/DAY IUD IUD 1 each by Intrauterine route once.   Omega-3 Fatty Acids (FISH OIL PO) Take 2 tablets by mouth daily.   ondansetron  (ZOFRAN -ODT) 8 MG disintegrating tablet Take 1 tablet (8 mg total) by mouth every 8 (eight) hours as needed for nausea or vomiting.   pantoprazole  (PROTONIX ) 40 MG tablet Take 1 tablet (40 mg total) by mouth daily. Take 20-30 minutes before meal.   Sofpironium  Bromide (SOFDRA ) 12.45 % GEL Apply 1 Application topically at bedtime. Apply to both feet and between toes.   AMOXICILLIN  PO Take by mouth 2 (two) times daily.   HYDROcodone -acetaminophen  (NORCO/VICODIN) 5-325 MG tablet Take 1 tablet by mouth every 4 (four) hours as needed. (Patient not taking: Reported on 12/15/2023)   Misc Natural Products (TURMERIC, CURCUMIN, PO) Take 1 tablet by mouth daily. (Patient not taking: Reported on 12/15/2023)   traZODone  (DESYREL ) 50 MG tablet Take 1 tablet (50 mg total)  by mouth at bedtime as needed for sleep.   [DISCONTINUED] traZODone  (DESYREL ) 50 MG tablet Take 50 mg by mouth at bedtime. (Patient not taking: Reported on 12/15/2023)   No facility-administered encounter medications on file as of 12/15/2023.    Follow-up: Return in about 1 year (around 12/14/2024) for FASTING LABS next WEEK, CPE.   Kijana Estock R Shanaye Rief, FNP

## 2023-12-15 NOTE — Assessment & Plan Note (Signed)
 Primary focal hyperhidrosis affecting hands, feet, and armpits with improvement using sofpironium  gel nightly. - Continue sulfapyrinium gel nightly.

## 2023-12-15 NOTE — Patient Instructions (Signed)
 1. Insomnia, unspecified type (Primary)  - traZODone  (DESYREL ) 50 MG tablet; Take 1 tablet (50 mg total) by mouth at bedtime as needed for sleep.  Dispense: 60 tablet; Refill: 1  2. Screening for lipid disorders  - Lipid panel; Future  3. Annual physical exam   It is important that you exercise regularly at least 30 minutes 5 times a week as tolerated  Think about what you will eat, plan ahead. Choose  clean, green, fresh or frozen over canned, processed or packaged foods which are more sugary, salty and fatty. 70 to 75% of food eaten should be vegetables and fruit. Three meals at set times with snacks allowed between meals, but they must be fruit or vegetables. Aim to eat over a 12 hour period , example 7 am to 7 pm, and STOP after  your last meal of the day. Drink water,generally about 64 ounces per day, no other drink is as healthy. Fruit juice is best enjoyed in a healthy way, by EATING the fruit.  Thanks for choosing [facility] we consider it a privelige to serve you.

## 2023-12-15 NOTE — Assessment & Plan Note (Signed)
 Trazodone 50 mg at bedtime as needed refilled

## 2023-12-15 NOTE — Assessment & Plan Note (Addendum)
  Annual exam as documented.  Counseling done include healthy lifestyle involving committing to 150 minutes of exercise per week, heart healthy diet, The importance of adequate sleep also discussed.  Regular use of seat belt  also discussed . Immunization and cancer screening  needs are specifically addressed at this visit.

## 2023-12-15 NOTE — Assessment & Plan Note (Signed)
,   described as silent reflux. Currently prescribed pantoprazole  40 mg daily but struggles with adherence due to irregular meal times. Symptoms are not severe, allowing for as-needed use of medication. - Use pantoprazole  as needed based on symptom control. - Advise dietary modifications to avoid triggers such as spicy foods and caffeine.

## 2023-12-15 NOTE — Assessment & Plan Note (Signed)
 Crohn's disease with perianal fistula Crohn's disease with perianal fistula, recent placement of setons, awaiting initiation of biologic therapy, currently on amoxicillin  to prevent infection. - Continue amoxicillin  until biologic therapy is initiated. - Plan for biologic therapy initiation. - Schedule shingles vaccine prior to biologic therapy.

## 2023-12-19 ENCOUNTER — Telehealth (HOSPITAL_COMMUNITY): Payer: Self-pay

## 2023-12-19 DIAGNOSIS — F331 Major depressive disorder, recurrent, moderate: Secondary | ICD-10-CM | POA: Diagnosis not present

## 2023-12-19 NOTE — Telephone Encounter (Addendum)
 Auth Submission: APPROVED Site of care: Site of care: [facility] Payer: [employer] Medication & CPT/J Code(s) submitted: Avsola  (infliximab -axxq) [phone] Diagnosis Code: K50.90 Route of submission (phone, fax, portal): fax Phone # Fax #[phone] Auth type: Buy/Bill HB Units/visits requested: 10mg /kg q4weeks Reference number: [id number] Approval from: 12/15/23 to 12/14/24

## 2023-12-22 ENCOUNTER — Other Ambulatory Visit (HOSPITAL_COMMUNITY): Payer: Self-pay

## 2023-12-22 ENCOUNTER — Other Ambulatory Visit: Payer: Self-pay

## 2023-12-22 DIAGNOSIS — Z13220 Encounter for screening for lipoid disorders: Secondary | ICD-10-CM

## 2023-12-22 MED ORDER — COMIRNATY 30 MCG/0.3ML IM SUSY
0.3000 mL | PREFILLED_SYRINGE | Freq: Once | INTRAMUSCULAR | 0 refills | Status: AC
  Filled 2023-12-22: qty 0.3, 1d supply, fill #0

## 2023-12-23 LAB — LIPID PANEL
Chol/HDL Ratio: 3 ratio (ref 0.0–4.4)
Cholesterol, Total: 240 mg/dL — ABNORMAL HIGH (ref 100–199)
HDL: 79 mg/dL (ref >39)
LDL Chol Calc (NIH): 148 mg/dL — ABNORMAL HIGH (ref 0–99)
Triglycerides: 74 mg/dL (ref 0–149)
VLDL Cholesterol Cal: 13 mg/dL (ref 5–40)

## 2023-12-25 ENCOUNTER — Ambulatory Visit: Payer: Self-pay | Admitting: Nurse Practitioner

## 2023-12-26 DIAGNOSIS — F331 Major depressive disorder, recurrent, moderate: Secondary | ICD-10-CM | POA: Diagnosis not present

## 2024-01-02 DIAGNOSIS — F331 Major depressive disorder, recurrent, moderate: Secondary | ICD-10-CM | POA: Diagnosis not present

## 2024-01-03 ENCOUNTER — Ambulatory Visit (HOSPITAL_COMMUNITY)
Admission: RE | Admit: 2024-01-03 | Discharge: 2024-01-03 | Disposition: A | Discharge status: Home / Self Care | Source: Ambulatory Visit | Attending: Internal Medicine | Admitting: Internal Medicine

## 2024-01-03 VITALS — BP 113/65 | HR 60 | Temp 98.2°F | Resp 16 | Wt 137.0 lb

## 2024-01-03 DIAGNOSIS — K50913 Crohn's disease, unspecified, with fistula: Secondary | ICD-10-CM | POA: Diagnosis present

## 2024-01-03 LAB — CBC
HCT: 39 % (ref 36.0–46.0)
Hemoglobin: 13.1 g/dL (ref 12.0–15.0)
MCH: 29.7 pg (ref 26.0–34.0)
MCHC: 33.6 g/dL (ref 30.0–36.0)
MCV: 88.4 fL (ref 80.0–100.0)
Platelets: 272 K/uL (ref 150–400)
RBC: 4.41 MIL/uL (ref 3.87–5.11)
RDW: 12.6 % (ref 11.5–15.5)
WBC: 2.9 K/uL — ABNORMAL LOW (ref 4.0–10.5)
nRBC: 0 % (ref 0.0–0.2)

## 2024-01-03 LAB — COMPREHENSIVE METABOLIC PANEL WITH GFR
ALT: 16 U/L (ref 0–44)
AST: 23 U/L (ref 15–41)
Albumin: 4.8 g/dL (ref 3.5–5.0)
Alkaline Phosphatase: 48 U/L (ref 38–126)
Anion gap: 9 (ref 5–15)
BUN: 11 mg/dL (ref 6–20)
CO2: 26 mmol/L (ref 22–32)
Calcium: 9.8 mg/dL (ref 8.9–10.3)
Chloride: 106 mmol/L (ref 98–111)
Creatinine, Ser: 0.8 mg/dL (ref 0.44–1.00)
GFR, Estimated: >60 mL/min (ref >60)
Glucose, Bld: 90 mg/dL (ref 70–99)
Potassium: 4.9 mmol/L (ref 3.5–5.1)
Sodium: 141 mmol/L (ref 135–145)
Total Bilirubin: 0.3 mg/dL (ref 0.0–1.2)
Total Protein: 7.3 g/dL (ref 6.5–8.1)

## 2024-01-03 LAB — SEDIMENTATION RATE: Sed Rate: 6 mm/h (ref 0–22)

## 2024-01-03 MED ORDER — ACETAMINOPHEN 325 MG PO TABS
650.0000 mg | ORAL_TABLET | Freq: Once | ORAL | Status: AC
  Administered 2024-01-03: 650 mg via ORAL
  Filled 2024-01-03: qty 2

## 2024-01-03 MED ORDER — METHYLPREDNISOLONE SODIUM SUCC 125 MG IJ SOLR
40.0000 mg | Freq: Once | INTRAMUSCULAR | Status: AC
  Administered 2024-01-03: 40 mg via INTRAVENOUS
  Filled 2024-01-03: qty 2

## 2024-01-03 MED ORDER — SODIUM CHLORIDE 0.9 % IV SOLN: INTRAVENOUS | Status: DC | PRN

## 2024-01-03 MED ORDER — DIPHENHYDRAMINE HCL 25 MG PO CAPS
25.0000 mg | ORAL_CAPSULE | Freq: Once | ORAL | Status: AC
  Administered 2024-01-03: 25 mg via ORAL
  Filled 2024-01-03: qty 1

## 2024-01-03 MED ORDER — SODIUM CHLORIDE 0.9 % IV SOLN
5.0000 mg/kg | Freq: Once | INTRAVENOUS | Status: AC
  Administered 2024-01-03: 300 mg via INTRAVENOUS
  Filled 2024-01-03: qty 30

## 2024-01-03 NOTE — Progress Notes (Signed)
 Diagnosis: Crohn's disease    Provider: Inocente Hausen    Procedure: Avsola 300 mg 1 st dose    Note: pt came in today for first Avsola 300 mg treatment for Crohn's disease.  Pt tolerated well, without incident.  Vital signs stable throughout .  Pt pre treated with meds per order ( tylenol , benadryl and solumedrol ).  Labs were drawn by our phlebotomist and taken to lab as ordered.  Pt will return for 2 dose on Dec 1st, this is a few days later than exact two week schedule but was approved by her PCP McGreal reason is holidays and patients work schedule.  Pt given written and verbal instructions upon discharge and verbalized understanding of any side effects and when to return to [facility].  Pt is alert and oriented, vital signs stable upon discharge.

## 2024-01-04 ENCOUNTER — Ambulatory Visit: Payer: Self-pay | Admitting: Pediatrics

## 2024-01-09 DIAGNOSIS — F331 Major depressive disorder, recurrent, moderate: Secondary | ICD-10-CM | POA: Diagnosis not present

## 2024-01-09 DIAGNOSIS — K60321 Anal fistula, complex, initial: Secondary | ICD-10-CM | POA: Diagnosis not present

## 2024-01-09 DIAGNOSIS — K50913 Crohn's disease, unspecified, with fistula: Secondary | ICD-10-CM | POA: Diagnosis not present

## 2024-01-09 NOTE — Progress Notes (Signed)
   PROVIDER:  BERNARDA WANDA NED, MD  MRN: [id number] DOB: 26-Jul-1990 DATE OF ENCOUNTER: 01/09/2024 Interval History:   33 y.o. F with bilateral anterior anal fistula.  There is concern for IBD.  She underwent EUA on 1021/25 and we found bilateral anterior trans-sphincteric anal fistula arising from distal anterior internal opening involving very little internal anal sphincter and all of the external anal sphincter.  No signs of active inflammation in the distal rectum or anal canal.  2 setons were placed.  She has now started infliximab  treatment.  She reports minimal drainage.  Physical Examination:   Gen: NAD Rectal: Setons in place with minimal inflammation or purulent drainage.  Assessment and Plan:   Debbie [name] is a 33 y.o. female who underwent seton placement and is now being treated for perianal IBD.  I will follow her on a monthly basis from now on and we we will eventually decide if the setons can be removed or any further surgical treatment would be required.  We briefly discussed the reasoning for this in someone who has inflammatory bowel disease.  Diagnoses and all orders for this visit:  Complex anal fistula  Crohn's disease with fistula, unspecified gastrointestinal tract location (CMS/HHS-HCC)     Return in about 1 month (around 02/08/2024).   The plan was discussed in detail with the patient today, who expressed understanding.  The patient has my contact information, and understands to call me with any additional questions or concerns in the interval.  I would be happy to see the patient back sooner if the need arises.   Bernarda JAYSON Ned, MD Colon and Rectal Surgery [facility]

## 2024-01-12 ENCOUNTER — Other Ambulatory Visit: Payer: Self-pay | Admitting: Pediatrics

## 2024-01-12 LAB — INFLIXIMAB (IFX) CONC+ IFX AB
Anti-Infliximab Antibody: <22 ng/mL
Infliximab Drug Level: <0.4 ug/mL

## 2024-01-22 ENCOUNTER — Ambulatory Visit (HOSPITAL_COMMUNITY)
Admission: RE | Admit: 2024-01-22 | Discharge: 2024-01-22 | Disposition: A | Discharge status: Home / Self Care | Source: Ambulatory Visit | Attending: Internal Medicine | Admitting: Internal Medicine

## 2024-01-22 VITALS — BP 108/69 | HR 74 | Temp 98.5°F | Resp 18 | Wt 140.0 lb

## 2024-01-22 DIAGNOSIS — K50913 Crohn's disease, unspecified, with fistula: Secondary | ICD-10-CM | POA: Insufficient documentation

## 2024-01-22 MED ORDER — SODIUM CHLORIDE 0.9 % IV SOLN
5.0000 mg/kg | Freq: Once | INTRAVENOUS | Status: AC
  Administered 2024-01-22: 300 mg via INTRAVENOUS
  Filled 2024-01-22: qty 30

## 2024-01-22 MED ORDER — ACETAMINOPHEN 325 MG PO TABS
650.0000 mg | ORAL_TABLET | Freq: Once | ORAL | Status: AC
  Administered 2024-01-22: 650 mg via ORAL
  Filled 2024-01-22: qty 2

## 2024-01-22 MED ORDER — METHYLPREDNISOLONE SODIUM SUCC 125 MG IJ SOLR
40.0000 mg | Freq: Once | INTRAMUSCULAR | Status: AC
  Administered 2024-01-22: 40 mg via INTRAVENOUS
  Filled 2024-01-22: qty 2

## 2024-01-22 MED ORDER — DIPHENHYDRAMINE HCL 25 MG PO CAPS
25.0000 mg | ORAL_CAPSULE | Freq: Once | ORAL | Status: AC
  Administered 2024-01-22: 25 mg via ORAL
  Filled 2024-01-22: qty 1

## 2024-01-22 NOTE — Progress Notes (Signed)
 Diagnosis: Crohn's Disease     Provider: Inocente Hausen     Procedure: Avsola  300 mg     Note:  Pt came in today for 2nd dose of Avsola  300 mg treatment for Crohn's disease.  Pt tolerated well, without incident.  Vital signs stable throughout infusion.  Pt pre treated with meds per order.  (Tylenol  , benadryl  and solumedrol given) Pt will return in two weeks for next dose .  Pt given verbal discharge instructions and verbalized understanding of any side effects and knows when to return.  Pt is alert and oriented , vital signs stable and pt ambulatory upon discharge.

## 2024-01-23 DIAGNOSIS — F331 Major depressive disorder, recurrent, moderate: Secondary | ICD-10-CM | POA: Diagnosis not present

## 2024-01-30 DIAGNOSIS — F331 Major depressive disorder, recurrent, moderate: Secondary | ICD-10-CM | POA: Diagnosis not present

## 2024-02-05 ENCOUNTER — Other Ambulatory Visit: Payer: Self-pay

## 2024-02-05 DIAGNOSIS — L750 Bromhidrosis: Secondary | ICD-10-CM

## 2024-02-05 DIAGNOSIS — R61 Generalized hyperhidrosis: Secondary | ICD-10-CM

## 2024-02-05 MED ORDER — SOFDRA 12.45 % EX GEL: 1.0000 | Freq: Every evening | CUTANEOUS | 5 refills | Status: AC

## 2024-02-05 NOTE — Progress Notes (Signed)
 Fax request from [facility] for new prescription for Sofdra .

## 2024-02-06 DIAGNOSIS — F331 Major depressive disorder, recurrent, moderate: Secondary | ICD-10-CM | POA: Diagnosis not present

## 2024-02-13 DIAGNOSIS — F331 Major depressive disorder, recurrent, moderate: Secondary | ICD-10-CM | POA: Diagnosis not present

## 2024-02-19 ENCOUNTER — Ambulatory Visit (HOSPITAL_COMMUNITY)
Admission: RE | Admit: 2024-02-19 | Discharge: 2024-02-19 | Disposition: A | Source: Ambulatory Visit | Attending: Nurse Practitioner

## 2024-02-19 VITALS — BP 108/61 | HR 75 | Temp 98.2°F | Resp 16 | Wt 143.0 lb

## 2024-02-19 DIAGNOSIS — K50913 Crohn's disease, unspecified, with fistula: Secondary | ICD-10-CM

## 2024-02-19 MED ORDER — SODIUM CHLORIDE 0.9 % IV SOLN
300.0000 mg | Freq: Once | INTRAVENOUS | Status: AC
  Administered 2024-02-19: 300 mg via INTRAVENOUS
  Filled 2024-02-19: qty 30

## 2024-02-19 MED ORDER — SODIUM CHLORIDE 0.9 % IV SOLN: INTRAVENOUS | Status: DC | PRN

## 2024-02-19 MED ORDER — ACETAMINOPHEN 325 MG PO TABS
650.0000 mg | ORAL_TABLET | Freq: Once | ORAL | Status: AC
  Administered 2024-02-19: 650 mg via ORAL
  Filled 2024-02-19: qty 2

## 2024-02-19 MED ORDER — DIPHENHYDRAMINE HCL 25 MG PO CAPS
25.0000 mg | ORAL_CAPSULE | Freq: Once | ORAL | Status: AC
  Administered 2024-02-19: 25 mg via ORAL
  Filled 2024-02-19: qty 1

## 2024-02-19 MED ORDER — METHYLPREDNISOLONE SODIUM SUCC 125 MG IJ SOLR
40.0000 mg | Freq: Once | INTRAMUSCULAR | Status: AC
  Administered 2024-02-19: 40 mg via INTRAVENOUS
  Filled 2024-02-19: qty 2

## 2024-02-19 NOTE — Progress Notes (Addendum)
 Diagnosis:Chrohn's disease    Provider:Nancy McGreal    Procedure:Avsola  300 mg    Note: Patient came in today for Avsola  300 mg treatemtn for Crohn's disease.  Patient had one gold tube lab drawn for level check per order and sent to lab.  Patient received her premedications as order tylenol , benadryl  and solumedrol.  Patient declined to wait the post observation infusion time.Patient declined AVS states will use my chart.  She says she will stop by front desk to schedule her 8 weeks appt for infusion.  Patient alert, oriented and ambulatory upon discharge.,vital signs stable.

## 2024-02-29 LAB — INFLIXIMAB (IFX) CONC+ IFX AB
Anti-Infliximab Antibody: 25 ng/mL
Infliximab Drug Level: 2.5 ug/mL

## 2024-03-03 ENCOUNTER — Ambulatory Visit: Payer: Self-pay | Admitting: Pediatrics

## 2024-03-05 ENCOUNTER — Other Ambulatory Visit: Payer: Self-pay | Admitting: Pediatrics

## 2024-03-05 ENCOUNTER — Encounter: Payer: Self-pay | Admitting: Pediatrics

## 2024-03-05 NOTE — Progress Notes (Signed)
 Debbie [name] has complex perianal fistulizing Crohn's disease with abscess formation and seton placement.  She has completed 3 induction doses of infliximab  (Avsola ) 5 mg/kg - last dose 02/19/2024 Week 6 therapeutic drug monitoring at the time of third induction dose showed a subtherapeutic infliximab  level of 2.5, low antibody titer 25  Expected therapeutic drug monitoring target at week 6 induction dose is infliximab  level 15-20 for IBD.  She reports ongoing symptoms of active drainage in the perianal region which is likely due to subtherapeutic drug level.  Based upon severe phenotype with complex perianal fistulizing disease, ongoing symptoms and subtherapeutic infliximab  level at time of week 6 induction, recommend dose optimization of infliximab  to 10 mg/kg every 4 weeks.  May need to consider combined immune suppression pending response to dose optimized biologic therapy.  Infusion navigator plan has been updated.  Will notify infusion pharmacy of change in dosing and infusion interval frequency indications.

## 2024-03-06 ENCOUNTER — Other Ambulatory Visit (HOSPITAL_COMMUNITY): Payer: Self-pay | Admitting: Pediatrics

## 2024-03-06 NOTE — Progress Notes (Signed)
 Per review of notes, patient's infliximab  is subtherapeutic. Dose will beincrease to 10mg /kg every 4 weeks  Last infusion on 02/19/2024  Once approved, patient can be scheduled as soon as 03/17/2024 for next Avsola  infusion  Louiza Moor, PharmD, MPH, BCPS, CPP Clinical Pharmacist

## 2024-03-11 ENCOUNTER — Encounter: Payer: Self-pay | Admitting: Nurse Practitioner

## 2024-03-11 ENCOUNTER — Other Ambulatory Visit (HOSPITAL_COMMUNITY): Payer: Self-pay

## 2024-03-11 ENCOUNTER — Ambulatory Visit: Admitting: Nurse Practitioner

## 2024-03-11 VITALS — BP 106/62 | HR 85 | Wt 151.0 lb

## 2024-03-11 DIAGNOSIS — R519 Headache, unspecified: Secondary | ICD-10-CM | POA: Diagnosis not present

## 2024-03-11 DIAGNOSIS — K50919 Crohn's disease, unspecified, with unspecified complications: Secondary | ICD-10-CM | POA: Diagnosis not present

## 2024-03-11 DIAGNOSIS — F331 Major depressive disorder, recurrent, moderate: Secondary | ICD-10-CM | POA: Insufficient documentation

## 2024-03-11 DIAGNOSIS — F419 Anxiety disorder, unspecified: Secondary | ICD-10-CM | POA: Insufficient documentation

## 2024-03-11 DIAGNOSIS — G4700 Insomnia, unspecified: Secondary | ICD-10-CM

## 2024-03-11 MED ORDER — ESCITALOPRAM OXALATE 10 MG PO TABS
10.0000 mg | ORAL_TABLET | Freq: Every day | ORAL | 1 refills | Status: AC
  Filled 2024-03-11: qty 60, 60d supply, fill #0

## 2024-03-11 NOTE — Assessment & Plan Note (Signed)
 Intermittent 'lightning-like' headaches resolved spontaneously, headache could be stress related no headache in over a week.  - Advised acetaminophen  or ibuprofen  as needed. - Encouraged hydration and rest.

## 2024-03-11 NOTE — Progress Notes (Signed)
 "  Acute Office Visit  Subjective:     Patient ID: Debbie [name], female    DOB: 1990/08/17, 34 y.o.   MRN: [id number]  Chief Complaint  Patient presents with   Headache    Headache  Pertinent negatives include no back pain.      Discussed the use of AI scribe software for clinical note transcription with the patient, who gave verbal consent to proceed.  History of Present Illness Debbie [name] is a 34 year old female   has a past medical history of Anxiety, Depression, Lupus, and Sjogren's syndrome.  Crohn's disease who presents with recent episodes of sudden headaches.  She has been experiencing sudden headaches occurring three to four times a week for two consecutive weeks. These headaches are described as sudden and intense, resembling 'lightning,' and last only a few minutes before resolving spontaneously. She has not taken any medication for these episodes. It has been about a week and a half since her last headache, and she does not typically experience headaches. No associated symptoms such as nausea, vomiting, or changes in vision are reported. She describes her pain tolerance as high and rates the severity of the headaches as moderate.  She has a history of Crohn's disease, complex anal fistula . This condition has impacted her confidence and social interactions.  She is currently taking trazodone  as needed to aid with sleep, which she notes does not help with her depression. She has previously been on Lexapro  and Prozac  for depression and anxiety, with Lexapro  being her preferred medication. She sees a therapist once a week.     Assessment & Plan     Review of Systems  Constitutional:  Negative for activity change, appetite change and chills.  Respiratory:  Negative for apnea, choking and chest tightness.   Cardiovascular:  Negative for chest pain, palpitations and leg swelling.  Genitourinary:  Negative for difficulty urinating, flank pain, frequency and  menstrual problem.  Musculoskeletal:  Negative for arthralgias, back pain and gait problem.  Neurological:  Positive for headaches.  Psychiatric/Behavioral:  Positive for sleep disturbance. Negative for agitation, behavioral problems, confusion, self-injury and suicidal ideas.         Objective:    BP 106/62   Pulse 85   Wt 151 lb (68.5 kg)   SpO2 100%   BMI 24.37 kg/m    Physical Exam Vitals and nursing note reviewed.  Constitutional:      General: She is not in acute distress.    Appearance: Normal appearance. She is not ill-appearing, toxic-appearing or diaphoretic.  Eyes:     General: No scleral icterus.       Right eye: No discharge.        Left eye: No discharge.     Extraocular Movements: Extraocular movements intact.     Conjunctiva/sclera: Conjunctivae normal.  Cardiovascular:     Rate and Rhythm: Normal rate and regular rhythm.     Pulses: Normal pulses.     Heart sounds: Normal heart sounds. No murmur heard.    No friction rub. No gallop.  Pulmonary:     Effort: Pulmonary effort is normal. No respiratory distress.     Breath sounds: Normal breath sounds. No stridor. No wheezing, rhonchi or rales.  Chest:     Chest wall: No tenderness.  Musculoskeletal:        General: No swelling, tenderness, deformity or signs of injury.     Right lower leg: No edema.  Left lower leg: No edema.  Skin:    General: Skin is warm and dry.     Capillary Refill: Capillary refill takes less than 2 seconds.     Coloration: Skin is not jaundiced or pale.     Findings: No bruising, erythema or lesion.  Neurological:     Mental Status: She is alert and oriented to person, place, and time.     Motor: No weakness.     Gait: Gait normal.  Psychiatric:        Mood and Affect: Mood normal.        Behavior: Behavior normal.        Thought Content: Thought content normal.        Judgment: Judgment normal.     No results found for any visits on 03/11/24.      Assessment &  Plan:   Problem List Items Addressed This Visit       Digestive   Crohn's disease with complication (HCC)   On inFLIXimab -axxq (AVSOLA ) 300 mg in sodium chloride  0.9 % 250 mL infusion         Other   Insomnia   Continue trazodone  50 mg at bedtime as needed      Anxiety - Primary       03/11/2024    3:21 PM 12/15/2023    2:05 PM 12/08/2022    1:02 PM 06/17/2022    1:01 PM  GAD 7 : Generalized Anxiety Score  Nervous, Anxious, on Edge 0 0  1  0   Control/stop worrying 0 0  0  0   Worry too much - different things 0 0  0  0   Trouble relaxing 0 0  0  0   Restless 0 0  1  0   Easily annoyed or irritable 0 1  0  1   Afraid - awful might happen 0 0  0  0   Total GAD 7 Score 0 1 2 1   Anxiety Difficulty Not difficult at all Not difficult at all Not difficult at all Not difficult at all     Data saved with a previous flowsheet row definition   Prefers Lexapro  over Prozac . No suicidal ideation. Weekly therapy ongoing. - Prescribed Lexapro  10 mg daily. - Continue weekly therapy sessions.        Relevant Medications   escitalopram  (LEXAPRO ) 10 MG tablet   Moderate recurrent major depression (HCC)      03/11/2024    3:21 PM 12/15/2023    2:04 PM 12/08/2022    1:02 PM  Depression screen PHQ 2/9  Decreased Interest 2 0 1  Down, Depressed, Hopeless 3 0 1  PHQ - 2 Score 5 0 2  Altered sleeping 1 3 2   Tired, decreased energy 2 1 1   Change in appetite 1 0 1  Feeling bad or failure about yourself  2 0 0  Trouble concentrating 1 0 0  Moving slowly or fidgety/restless 0 0 0  Suicidal thoughts 1 0 0  PHQ-9 Score 13 4  6    Difficult doing work/chores Somewhat difficult Not difficult at all Somewhat difficult     Data saved with a previous flowsheet row definition   Anxiety disorder Anxiety possibly worsened by perianal Crohn's disease. Prefers Lexapro  over Prozac . No suicidal ideation. Weekly therapy ongoing. - Prescribed Lexapro  10 mg daily. - Continue weekly therapy  sessions.      Relevant Medications   escitalopram  (LEXAPRO ) 10 MG tablet  Acute nonintractable headache   Intermittent 'lightning-like' headaches resolved spontaneously, headache could be stress related no headache in over a week.  - Advised acetaminophen  or ibuprofen  as needed. - Encouraged hydration and rest.       Relevant Medications   escitalopram  (LEXAPRO ) 10 MG tablet    Meds ordered this encounter  Medications   escitalopram  (LEXAPRO ) 10 MG tablet    Sig: Take 1 tablet (10 mg total) by mouth daily.    Dispense:  60 tablet    Refill:  1    Return in about 6 weeks (around 04/22/2024) for ANXIETY, DEPRESSION.  Daanish Copes R Rayetta Veith, FNP  "

## 2024-03-11 NOTE — Assessment & Plan Note (Signed)
" °    03/11/2024    3:21 PM 12/15/2023    2:04 PM 12/08/2022    1:02 PM  Depression screen PHQ 2/9  Decreased Interest 2 0 1  Down, Depressed, Hopeless 3 0 1  PHQ - 2 Score 5 0 2  Altered sleeping 1 3 2   Tired, decreased energy 2 1 1   Change in appetite 1 0 1  Feeling bad or failure about yourself  2 0 0  Trouble concentrating 1 0 0  Moving slowly or fidgety/restless 0 0 0  Suicidal thoughts 1 0 0  PHQ-9 Score 13 4  6    Difficult doing work/chores Somewhat difficult Not difficult at all Somewhat difficult     Data saved with a previous flowsheet row definition   Anxiety disorder Anxiety possibly worsened by perianal Crohn's disease. Prefers Lexapro  over Prozac . No suicidal ideation. Weekly therapy ongoing. - Prescribed Lexapro  10 mg daily. - Continue weekly therapy sessions. "

## 2024-03-11 NOTE — Assessment & Plan Note (Signed)
 On inFLIXimab -axxq (AVSOLA ) 300 mg in sodium chloride  0.9 % 250 mL infusion

## 2024-03-11 NOTE — Patient Instructions (Addendum)
 1. Anxiety (Primary) - escitalopram  (LEXAPRO ) 10 MG tablet; Take 1 tablet (10 mg total) by mouth daily.  Dispense: 60 tablet; Refill: 1  2. Moderate recurrent major depression (HCC)    For headache please take Tylenol  650 mg every 6 hours as needed I encouraged adequate rest and hydration   It is important that you exercise regularly at least 30 minutes 5 times a week as tolerated  Think about what you will eat, plan ahead. Choose  clean, green, fresh or frozen over canned, processed or packaged foods which are more sugary, salty and fatty. 70 to 75% of food eaten should be vegetables and fruit. Three meals at set times with snacks allowed between meals, but they must be fruit or vegetables. Aim to eat over a 12 hour period , example 7 am to 7 pm, and STOP after  your last meal of the day. Drink water,generally about 64 ounces per day, no other drink is as healthy. Fruit juice is best enjoyed in a healthy way, by EATING the fruit.  Thanks for choosing [facility] we consider it a privelige to serve you.

## 2024-03-11 NOTE — Assessment & Plan Note (Signed)
Continue trazodone 50 mg at bedtime as needed

## 2024-03-11 NOTE — Assessment & Plan Note (Addendum)
" ° °    03/11/2024    3:21 PM 12/15/2023    2:05 PM 12/08/2022    1:02 PM 06/17/2022    1:01 PM  GAD 7 : Generalized Anxiety Score  Nervous, Anxious, on Edge 0 0  1  0   Control/stop worrying 0 0  0  0   Worry too much - different things 0 0  0  0   Trouble relaxing 0 0  0  0   Restless 0 0  1  0   Easily annoyed or irritable 0 1  0  1   Afraid - awful might happen 0 0  0  0   Total GAD 7 Score 0 1 2 1   Anxiety Difficulty Not difficult at all Not difficult at all Not difficult at all Not difficult at all     Data saved with a previous flowsheet row definition   Prefers Lexapro  over Prozac . No suicidal ideation. Weekly therapy ongoing. - Prescribed Lexapro  10 mg daily. - Continue weekly therapy sessions.   "

## 2024-03-19 ENCOUNTER — Ambulatory Visit (HOSPITAL_COMMUNITY)
Admission: RE | Admit: 2024-03-19 | Discharge: 2024-03-19 | Disposition: A | Discharge status: Home / Self Care | Source: Ambulatory Visit | Attending: Internal Medicine | Admitting: Internal Medicine

## 2024-03-19 VITALS — BP 110/75 | HR 70 | Temp 98.6°F | Resp 18 | Wt 149.0 lb

## 2024-03-19 DIAGNOSIS — K50919 Crohn's disease, unspecified, with unspecified complications: Secondary | ICD-10-CM | POA: Insufficient documentation

## 2024-03-19 MED ORDER — SODIUM CHLORIDE 0.9 % IV SOLN: INTRAVENOUS | Status: DC | PRN

## 2024-03-19 MED ORDER — METHYLPREDNISOLONE SODIUM SUCC 125 MG IJ SOLR
40.0000 mg | Freq: Once | INTRAMUSCULAR | Status: AC
  Administered 2024-03-19: 40 mg via INTRAVENOUS
  Filled 2024-03-19: qty 2

## 2024-03-19 MED ORDER — SODIUM CHLORIDE 0.9 % IV SOLN
10.0000 mg/kg | Freq: Once | INTRAVENOUS | Status: AC
  Administered 2024-03-19: 700 mg via INTRAVENOUS
  Filled 2024-03-19: qty 70

## 2024-03-19 MED ORDER — ACETAMINOPHEN 325 MG PO TABS
650.0000 mg | ORAL_TABLET | Freq: Once | ORAL | Status: AC
  Administered 2024-03-19: 650 mg via ORAL
  Filled 2024-03-19: qty 2

## 2024-03-19 MED ORDER — DIPHENHYDRAMINE HCL 25 MG PO CAPS
25.0000 mg | ORAL_CAPSULE | Freq: Once | ORAL | Status: AC
  Administered 2024-03-19: 25 mg via ORAL
  Filled 2024-03-19: qty 1

## 2024-03-19 NOTE — Progress Notes (Signed)
 Diagnosis: Crohn's Disease    Provider: Inocente Hausen    Procedure: Avsola      Note: Patient came in today for Avsola  5mg /kg given via PIV, no labs drawn this time because none due and this was verified with patient as well .  Patient took pre meds as ordered,  patient tolerated IV infusion without  any problem.  Patient declined AVS and she has her next appt set up for infusion.  Patient alert, oriented and ambulatory at time of discharge.

## 2024-04-03 ENCOUNTER — Ambulatory Visit: Admitting: Dermatology

## 2024-04-15 ENCOUNTER — Ambulatory Visit (HOSPITAL_COMMUNITY)

## 2024-04-22 ENCOUNTER — Ambulatory Visit: Payer: Self-pay | Admitting: Nurse Practitioner

## 2024-05-08 ENCOUNTER — Ambulatory Visit: Admitting: Pediatrics

## 2024-05-13 ENCOUNTER — Ambulatory Visit (HOSPITAL_COMMUNITY)

## 2024-12-19 ENCOUNTER — Encounter: Payer: Self-pay | Admitting: Nurse Practitioner
